# Patient Record
Sex: Male | Born: 1965
Health system: Southern US, Community
[De-identification: ages and names within clinical notes are randomized; demographics above are authoritative.]

## PROBLEM LIST (undated history)

## (undated) DIAGNOSIS — R569 Unspecified convulsions: Secondary | ICD-10-CM

## (undated) DIAGNOSIS — K219 Gastro-esophageal reflux disease without esophagitis: Secondary | ICD-10-CM

## (undated) DIAGNOSIS — J45909 Unspecified asthma, uncomplicated: Secondary | ICD-10-CM

## (undated) DIAGNOSIS — M1990 Unspecified osteoarthritis, unspecified site: Secondary | ICD-10-CM

## (undated) DIAGNOSIS — F329 Major depressive disorder, single episode, unspecified: Secondary | ICD-10-CM

## (undated) DIAGNOSIS — I10 Essential (primary) hypertension: Secondary | ICD-10-CM

## (undated) DIAGNOSIS — F32A Depression, unspecified: Secondary | ICD-10-CM

## (undated) DIAGNOSIS — J449 Chronic obstructive pulmonary disease, unspecified: Secondary | ICD-10-CM

## (undated) DIAGNOSIS — E785 Hyperlipidemia, unspecified: Secondary | ICD-10-CM

## (undated) HISTORY — DX: Unspecified convulsions: R56.9

## (undated) HISTORY — DX: Hyperlipidemia, unspecified: E78.5

## (undated) HISTORY — DX: Depression, unspecified: F32.A

## (undated) HISTORY — DX: Major depressive disorder, single episode, unspecified: F32.9

## (undated) HISTORY — DX: Unspecified osteoarthritis, unspecified site: M19.90

## (undated) HISTORY — DX: Gastro-esophageal reflux disease without esophagitis: K21.9

## (undated) HISTORY — DX: Essential (primary) hypertension: I10

---

## 2006-01-06 ENCOUNTER — Ambulatory Visit (HOSPITAL_COMMUNITY): Admission: RE | Admit: 2006-01-06 | Discharge: 2006-01-06 | Payer: Self-pay | Admitting: *Deleted

## 2006-01-20 ENCOUNTER — Encounter: Admission: RE | Admit: 2006-01-20 | Discharge: 2006-02-08 | Payer: Self-pay | Admitting: *Deleted

## 2011-01-03 ENCOUNTER — Encounter: Payer: Self-pay | Admitting: *Deleted

## 2016-01-20 ENCOUNTER — Encounter: Payer: Self-pay | Admitting: Physician Assistant

## 2016-02-17 ENCOUNTER — Encounter: Payer: Self-pay | Admitting: Physician Assistant

## 2016-03-04 DIAGNOSIS — S79912A Unspecified injury of left hip, initial encounter: Secondary | ICD-10-CM | POA: Diagnosis not present

## 2016-03-04 DIAGNOSIS — Z049 Encounter for examination and observation for unspecified reason: Secondary | ICD-10-CM | POA: Diagnosis not present

## 2016-03-04 DIAGNOSIS — M79605 Pain in left leg: Secondary | ICD-10-CM | POA: Diagnosis not present

## 2016-03-04 DIAGNOSIS — M25552 Pain in left hip: Secondary | ICD-10-CM | POA: Diagnosis not present

## 2016-08-18 ENCOUNTER — Ambulatory Visit: Payer: Self-pay | Admitting: Orthopaedic Surgery

## 2016-08-19 ENCOUNTER — Ambulatory Visit (INDEPENDENT_AMBULATORY_CARE_PROVIDER_SITE_OTHER): Payer: Medicare Other | Admitting: Orthopaedic Surgery

## 2016-08-19 ENCOUNTER — Ambulatory Visit: Payer: Medicare Other

## 2016-08-19 ENCOUNTER — Other Ambulatory Visit: Payer: Self-pay | Admitting: *Deleted

## 2016-08-19 ENCOUNTER — Encounter: Payer: Self-pay | Admitting: Orthopaedic Surgery

## 2016-08-19 ENCOUNTER — Ambulatory Visit (INDEPENDENT_AMBULATORY_CARE_PROVIDER_SITE_OTHER): Payer: Medicare Other

## 2016-08-19 VITALS — BP 150/94 | HR 58 | Temp 97.7°F | Ht 72.0 in | Wt 211.0 lb

## 2016-08-19 DIAGNOSIS — M25572 Pain in left ankle and joints of left foot: Secondary | ICD-10-CM | POA: Diagnosis not present

## 2016-08-19 DIAGNOSIS — M25562 Pain in left knee: Secondary | ICD-10-CM | POA: Diagnosis not present

## 2016-08-19 NOTE — Progress Notes (Signed)
Subjective:  My left knee and my left ankle hurt    Patient ID: Bradley Fisher, male    DOB: 1966/02/20, 50 y.o.   MRN: [id number]  HPI  He stepped in a hole about four months ago and hurt his left knee and his left ankle.  He has been followed at the [facility] in [place] since then.  The last records I have from them were dated 06-30-16.  He has giving way of the left knee. He has swelling and popping.  He has no redness.  He has used heat, ice, rest.  He now uses a knee brace which helps.    He has pain of the right medial ankle.  He uses a brace on it and it helps.  He has also done heat, ice, elevation and rest which helped some.  He is concerned that his knee gives way and continues to hurt.  He is sent here for further evaluation.  Review of Systems  HENT: Negative for congestion.   Respiratory: Negative for cough and shortness of breath.   Cardiovascular: Negative for chest pain and leg swelling.  Endocrine: Positive for cold intolerance.  Musculoskeletal: Positive for arthralgias, gait problem and joint swelling.  Allergic/Immunologic: Positive for environmental allergies.   No past medical history on file.  No past surgical history on file.  No current outpatient prescriptions on file prior to visit.   No current facility-administered medications on file prior to visit.     Social History   Social History  . Marital status: Single    Spouse name: N/A  . Number of children: N/A  . Years of education: N/A   Occupational History  . Not on file.   Social History Main Topics  . Smoking status: Never Smoker  . Smokeless tobacco: Never Used  . Alcohol use Not on file  . Drug use: Unknown  . Sexual activity: Not on file   Other Topics Concern  . Not on file   Social History Narrative  . No narrative on file    He has family history of COPD, heart attacks, stroke and arthritis.  BP (!) 150/94   Pulse (!) 58   Temp 97.7 F (36.5 C)   Ht 6'  (1.829 m)   Wt 211 lb (95.7 kg)   BMI 28.62 kg/m      Objective:   Physical Exam  Constitutional: He is oriented to person, place, and time. He appears well-developed and well-nourished.  HENT:  Head: Normocephalic and atraumatic.  Eyes: Conjunctivae and EOM are normal. Pupils are equal, round, and reactive to light.  Neck: Normal range of motion. Neck supple.  Cardiovascular: Normal rate, regular rhythm and intact distal pulses.   Pulmonary/Chest: Effort normal.  Abdominal: Soft.  Musculoskeletal: He exhibits tenderness (Pain left knee. limp.  ROM knee 0 to 105 with pain.  Slight effusion.  Positive medial McMurray.  Left ankle with full motion and no swelling, medial joint pain.  NV intact.  Right side negative.).  Neurological: He is alert and oriented to person, place, and time. He has normal reflexes. He displays normal reflexes. No cranial nerve deficit. He exhibits normal muscle tone. Coordination normal.  Skin: Skin is warm and dry.  Psychiatric: He has a normal mood and affect. His behavior is normal. Judgment and thought content normal.   X-rays were done of the left knee and left ankle, reported separately.       Assessment & Plan:   Encounter  Diagnoses  Name Primary?  . Left knee pain Yes  . Left ankle pain    I am concerned about a medial meniscus tear of the left knee.  He has swelling, continued pain after months of conservative treatment, giving way of the knee, need for a brace.  He has positive McMurray and definitive limp to the left.  Return after MRI of the knee on the left.  Precautions discussed.  Call if any problem.  Electronically Signed Sanjuana Kava, MD 9/7/201711:08 AM

## 2016-08-25 ENCOUNTER — Ambulatory Visit (HOSPITAL_COMMUNITY)
Admission: RE | Admit: 2016-08-25 | Discharge: 2016-08-25 | Disposition: A | Discharge status: Home / Self Care | Payer: Medicare Other | Source: Ambulatory Visit | Attending: Orthopaedic Surgery | Admitting: Orthopaedic Surgery

## 2016-08-25 DIAGNOSIS — M25562 Pain in left knee: Secondary | ICD-10-CM | POA: Diagnosis present

## 2016-08-25 DIAGNOSIS — M1712 Unilateral primary osteoarthritis, left knee: Secondary | ICD-10-CM | POA: Insufficient documentation

## 2016-08-25 DIAGNOSIS — M23222 Derangement of posterior horn of medial meniscus due to old tear or injury, left knee: Secondary | ICD-10-CM | POA: Insufficient documentation

## 2016-08-26 ENCOUNTER — Encounter: Payer: Self-pay | Admitting: Orthopaedic Surgery

## 2016-08-26 ENCOUNTER — Ambulatory Visit (INDEPENDENT_AMBULATORY_CARE_PROVIDER_SITE_OTHER): Payer: Medicare Other | Admitting: Orthopaedic Surgery

## 2016-08-26 VITALS — BP 146/88 | HR 54 | Ht 72.0 in | Wt 215.0 lb

## 2016-08-26 DIAGNOSIS — M25562 Pain in left knee: Secondary | ICD-10-CM | POA: Diagnosis not present

## 2016-08-26 NOTE — Progress Notes (Signed)
Patient [id number] Bradley Fisher, male DOB:[birth year], 50 y.o. [id number]  Chief Complaint  Patient presents with  . Follow-up    left knee pain here for MRI results    HPI  Bradley Fisher is a 50 y.o. male who has chronic left knee pain with swelling and giving way.  He had MRI which showed: IMPRESSION: 1. Small free edge tear of the posterior horn medial meniscus. 2. Mild degenerative chondral thinning in the lateral compartment.  I have explained the findings.  He still has pain and giving way.  I would like to have him see Dr. Aline Brochure for possible consideration of arthroscopy.  He is agreeable. HPI  Body mass index is 29.16 kg/m.  ROS  Review of Systems  HENT: Negative for congestion.   Respiratory: Negative for cough and shortness of breath.   Cardiovascular: Negative for chest pain and leg swelling.  Endocrine: Positive for cold intolerance.  Musculoskeletal: Positive for arthralgias, gait problem and joint swelling.  Allergic/Immunologic: Positive for environmental allergies.    No past medical history on file.  No past surgical history on file.  No family history on file.  Social History Social History  Substance Use Topics  . Smoking status: Never Smoker  . Smokeless tobacco: Never Used  . Alcohol use Not on file    No Known Allergies  Current Outpatient Prescriptions  Medication Sig Dispense Refill  . Albuterol Sulfate (PROAIR HFA IN) Inhale into the lungs.    . ATORVASTATIN CALCIUM PO Take by mouth.    Marland Kitchen LAMOTRIGINE PO Take by mouth.    Marland Kitchen LISINOPRIL PO Take by mouth.    . MELOXICAM PO Take by mouth.    . OMEPRAZOLE PO Take by mouth.    . SERTRALINE HCL PO Take by mouth.    Marland Kitchen TIZANIDINE HCL PO Take by mouth.     No current facility-administered medications for this visit.      Physical Exam  Blood pressure (!) 146/88, pulse (!) 54, height 6' (1.829 m), weight 215 lb (97.5 kg).  Constitutional: overall normal hygiene, normal nutrition, well  developed, normal grooming, normal body habitus. Assistive device:none  Musculoskeletal: gait and station Limp left, muscle tone and strength are normal, no tremors or atrophy is present.  .  Neurological: coordination overall normal.  Deep tendon reflex/nerve stretch intact.  Sensation normal.  Cranial nerves II-XII intact.   Skin:   Normal overall no scars, lesions, ulcers or rashes. No psoriasis.  Psychiatric: Alert and oriented x 3.  Recent memory intact, remote memory unclear.  Normal mood and affect. Well groomed.  Good eye contact.  Cardiovascular: overall no swelling, no varicosities, no edema bilaterally, normal temperatures of the legs and arms, no clubbing, cyanosis and good capillary refill.  Lymphatic: palpation is normal.  The right lower extremity is examined:  Inspection:  Thigh:  Non-tender and no defects  Knee has swelling 1+ effusion.                        Joint tenderness is present                        Patient is tender over the medial joint line  Lower Leg:  Has normal appearance and no tenderness or defects  Ankle:  Non-tender and no defects  Foot:  Non-tender and no defects Range of Motion:  Knee:  Range of motion is: 0-105  Crepitus is  present  Ankle:  Range of motion is normal. Strength and Tone:  The left lower extremity has normal strength and tone. Stability:  Knee:  The knee has positive medial McMurray.  Ankle:  The ankle is stable.    The patient has been educated about the nature of the problem(s) and counseled on treatment options.  The patient appeared to understand what I have discussed and is in agreement with it.  Encounter Diagnosis  Name Primary?  . Left knee pain Yes    PLAN Call if any problems.  Precautions discussed.  Continue current medications.   Return to clinic see Dr. Aline Brochure for possible surgery   Electronically Signed Sanjuana Kava, MD 9/14/20173:20 PM

## 2016-09-03 ENCOUNTER — Encounter: Payer: Self-pay | Admitting: Orthopedic Surgery

## 2016-09-03 ENCOUNTER — Ambulatory Visit (INDEPENDENT_AMBULATORY_CARE_PROVIDER_SITE_OTHER): Payer: Medicare Other | Admitting: Orthopedic Surgery

## 2016-09-03 VITALS — BP 136/76 | HR 56 | Ht 72.0 in | Wt 215.0 lb

## 2016-09-03 DIAGNOSIS — M545 Low back pain, unspecified: Secondary | ICD-10-CM

## 2016-09-03 DIAGNOSIS — M25562 Pain in left knee: Secondary | ICD-10-CM | POA: Diagnosis not present

## 2016-09-03 DIAGNOSIS — M23322 Other meniscus derangements, posterior horn of medial meniscus, left knee: Secondary | ICD-10-CM

## 2016-09-03 MED ORDER — NABUMETONE 500 MG PO TABS: 500.0000 mg | ORAL_TABLET | Freq: Two times a day (BID) | ORAL | 1 refills | Status: DC

## 2016-09-03 NOTE — Progress Notes (Signed)
Chief Complaint  Patient presents with  . Knee Pain    Left knee pain, referred from Dr. Luna Glasgow for surgery.   Preoperative history and physical  HPI 50 year old male presents for evaluation of possible arthroscopy left knee for torn medial meniscus diagnosed on MRI. His symptoms include pain on the medial joint line and mechanical symptoms post stepping in a hole some time ago. X-rays did show some mild degenerative changes and an effusion  He's nonoperative treatment with Dr. Luna Glasgow which failed to relieve his symptoms  HPI as reported by Dr. Luna Glasgow   He stepped in a hole about four months ago and hurt his left knee and his left ankle.  He has been followed at the [facility] in [place] since then.  The last records I have from them were dated 06-30-16.   He has giving way of the left knee. He has swelling and popping.  He has no redness.  He has used heat, ice, rest.  He now uses a knee brace which helps.     He notes that his pain is in his medial calf posterior leg radiates down to his medial ankle without any back pain, but he does have history of arthritis in his back.   ROS Review of Systems as previously recorded by Dr. Luna Glasgow HENT: Negative for congestion.   Respiratory: Negative for cough and shortness of breath.   Cardiovascular: Negative for chest pain and leg swelling.  Endocrine: Positive for cold intolerance.  Musculoskeletal: Positive for arthralgias, gait problem and joint swelling.  Allergic/Immunologic: Positive for environmental allergies.    History reviewed. No pertinent past medical history. The patient reports no medical history and he says he's never had surgery  He has no family history of any serious medical illnesses or bleeding disorders  History reviewed. No pertinent surgical history. History reviewed. No pertinent family history. Social History  Substance Use Topics  . Smoking status: Never Smoker  . Smokeless tobacco: Current User     Types: Chew  . Alcohol use Not on file   No outpatient prescriptions have been marked as taking for the 09/03/16 encounter (Office Visit) with Carole Civil, MD.    BP 136/76   Pulse (!) 56   Ht 6' (1.829 m)   Wt 215 lb (97.5 kg)   BMI 29.16 kg/m   Physical Exam  Constitutional: He is oriented to person, place, and time. He appears well-developed and well-nourished. No distress.  Cardiovascular: Normal rate and intact distal pulses.   Neurological: He is alert and oriented to person, place, and time.  Skin: Skin is warm and dry. No rash noted. He is not diaphoretic. No erythema. No pallor.  Psychiatric: He has a normal mood and affect. His behavior is normal. Judgment and thought content normal.    Ortho Exam he does have a slight limp when he walks  Right knee range of motion stability and strength normal skin without rash lesion or ulceration tenderness or swelling no peripheral edema normal sensation  Left knee medial joint line tenderness. On straight leg raising he had pain radiating from his posterior knee area across the medial calf muscle down into the left ankle without numbness or tingling  He had no joint effusion is range of motion was full his leg is stable in terms of knee ligaments and strength muscle tone were normal skin was intact without rash or erythema   ASSESSMENT: My personal interpretation of the images:  Plain films are really unremarkable  he may have some symmetric joint space narrowing but no secondary bone changes.  His MRI shows a small medial meniscal tear on the free edge   I'm concerned that his pain is out of proportion to his imaging findings and exam findings. I think he may have some lumbar spine related pain in his left leg  He was agreeable to an injection in a trial of bracing and Relafen and come back in 2 weeks if no improvement he understands that we can still do the knee scope and we may or may not relieve all his  symptoms PLAN Procedure note left knee injection verbal consent was obtained to inject left knee joint  Timeout was completed to confirm the site of injection  The medications used were 40 mg of Depo-Medrol and 1% lidocaine 3 cc  Anesthesia was provided by ethyl chloride and the skin was prepped with alcohol.  After cleaning the skin with alcohol a 20-gauge needle was used to inject the left knee joint. There were no complications. A sterile bandage was applied.  2 week recheck  Arther Abbott, MD 09/03/2016 9:34 AM  .meds

## 2016-09-03 NOTE — Patient Instructions (Addendum)

## 2016-09-14 ENCOUNTER — Other Ambulatory Visit: Payer: Self-pay | Admitting: *Deleted

## 2016-09-14 MED ORDER — OMEPRAZOLE 20 MG PO CPDR: 20.0000 mg | DELAYED_RELEASE_CAPSULE | Freq: Two times a day (BID) | ORAL | 1 refills | Status: DC

## 2016-09-15 ENCOUNTER — Other Ambulatory Visit: Payer: Self-pay | Admitting: *Deleted

## 2016-09-15 MED ORDER — OMEPRAZOLE 20 MG PO CPDR
20.0000 mg | DELAYED_RELEASE_CAPSULE | Freq: Two times a day (BID) | ORAL | 1 refills | Status: DC
Start: 2016-09-15 — End: 2017-01-12

## 2016-09-17 ENCOUNTER — Ambulatory Visit: Payer: Medicare Other | Admitting: Orthopedic Surgery

## 2016-09-27 ENCOUNTER — Ambulatory Visit (INDEPENDENT_AMBULATORY_CARE_PROVIDER_SITE_OTHER): Payer: Medicare Other | Admitting: Orthopedic Surgery

## 2016-09-27 ENCOUNTER — Encounter: Payer: Self-pay | Admitting: Orthopedic Surgery

## 2016-09-27 VITALS — BP 136/89 | HR 62 | Wt 212.0 lb

## 2016-09-27 DIAGNOSIS — M23322 Other meniscus derangements, posterior horn of medial meniscus, left knee: Secondary | ICD-10-CM | POA: Diagnosis not present

## 2016-09-27 NOTE — Patient Instructions (Addendum)
Surgery left knee arthroscopy with partial medial meniscectomy  You have decided to proceed with operative arthroscopy of the knee. You have decided not to continue with nonoperative measures such as but not limited to oral medication, weight loss, activity modification, physical therapy, bracing, or injection.  We will perform operative arthroscopy of the knee. Some of the risks associated with arthroscopic surgery of the knee include but are not limited to Bleeding Infection Swelling Stiffness Blood clot Pain  If you're not comfortable with these risks and would like to continue with nonoperative treatment please let Dr. Aline Brochure know prior to your surgery.  Knee Arthroscopy Knee arthroscopy is a surgical procedure that is used to examine the inside of your knee joint and repair any damage. The surgeon puts a small, lighted instrument with a camera on the tip (arthroscope) through a small incision in your knee. The camera sends pictures to a monitor in the operating room. Your surgeon uses those pictures to guide the surgical instruments through other incisions to the area of damage. Knee arthroscopy can be used to treat many types of knee problems. It may be used:  To repair a torn ligament.  To repair or remove damaged tissue.  To remove a fluid-filled sac (cyst) from your knee. LET [facility] CARE PROVIDER KNOW ABOUT:  Any allergies you have.  All medicines you are taking, including vitamins, herbs, eye drops, creams, and over-the-counter medicines.  Previous problems you or members of your family have had with the use of anesthetics.  Any blood disorders you have.  Previous surgeries you have had.  Any medical conditions you may have. RISKS AND COMPLICATIONS Generally, this is a safe procedure. However, problems may occur, including:  Infection.  Bleeding.  Damage to blood vessels, nerves, or structures of your knee.  A blood clot that forms in your leg and travels  to your lung.  Failure to relieve symptoms. BEFORE THE PROCEDURE  Ask your health care provider about:  Changing or stopping your regular medicines. This is especially important if you are taking diabetes medicines or blood thinners.  Taking medicines such as aspirin and ibuprofen. These medicines can thin your blood. Do not take these medicines before your procedure if your health care provider instructs you not to.  Follow your health care provider's instructions about eating or drinking restrictions.  Plan to have someone take you home after the procedure.  If you go home right after the procedure, plan to have someone with you for 24 hours.  Do not drink alcohol unless your health care provider says that you can.  Do not use any tobacco products, including cigarettes, chewing tobacco, or electronic cigarettes unless your health care provider says that you can. If you need help quitting, ask your health care provider.  You may have a physical exam. PROCEDURE  An IV tube will be inserted into one of your veins.  You will be given one or more of the following:  A medicine that helps you relax (sedative).  A medicine that numbs the area (local anesthetic).  A medicine that makes you fall asleep (general anesthetic).  A medicine that is injected into your spine that numbs the area below and slightly above the injection site (spinal anesthetic).  A medicine that is injected into an area of your body that numbs everything below the injection site (regional anesthetic).  A cuff may be placed around your upper leg to slow bleeding during the procedure.  The surgeon will make a  small number of incisions around your knee.  Your knee joint will be flushed and filled with a germ-free (sterile) solution.  The arthroscope will be passed through an incision into your knee joint.  More instruments will be passed through other incisions to repair your knee as needed.  The fluid will  be removed from your knee.  The incisions will be closed with adhesive strips or stitches (sutures).  A bandage (dressing) will be placed over your knee. The procedure may vary among health care providers and hospitals. AFTER THE PROCEDURE  Your blood pressure, heart rate, breathing rate and blood oxygen level will be monitored often until the medicines you were given have worn off.  You may be given medicine for pain.  You may get crutches to help you walk without using your knee to support your body weight.  You may have to wear compression stockings. These stocking help to prevent blood clots and reduce swelling in your legs.   This information is not intended to replace advice given to you by your health care provider. Make sure you discuss any questions you have with your health care provider.   Document Released: 11/26/2000 Document Revised: 04/15/2015 Document Reviewed: 11/25/2014 Elsevier Interactive Patient Education 2016 [employer] Knee Arthroscopy Knee arthroscopy is a surgical procedure that is used to examine the inside of your knee joint and repair any damage. The surgeon puts a small, lighted instrument with a camera on the tip (arthroscope) through a small incision in your knee. The camera sends pictures to a monitor in the operating room. Your surgeon uses those pictures to guide the surgical instruments through other incisions to the area of damage. Knee arthroscopy can be used to treat many types of knee problems. It may be used:  To repair a torn ligament.  To repair or remove damaged tissue.  To remove a fluid-filled sac (cyst) from your knee. LET [facility] CARE PROVIDER KNOW ABOUT:  Any allergies you have.  All medicines you are taking, including vitamins, herbs, eye drops, creams, and over-the-counter medicines.  Previous problems you or members of your family have had with the use of anesthetics.  Any blood disorders you have.  Previous surgeries  you have had.  Any medical conditions you may have. RISKS AND COMPLICATIONS Generally, this is a safe procedure. However, problems may occur, including:  Infection.  Bleeding.  Damage to blood vessels, nerves, or structures of your knee.  A blood clot that forms in your leg and travels to your lung.  Failure to relieve symptoms. BEFORE THE PROCEDURE  Ask your health care provider about:  Changing or stopping your regular medicines. This is especially important if you are taking diabetes medicines or blood thinners.  Taking medicines such as aspirin and ibuprofen. These medicines can thin your blood. Do not take these medicines before your procedure if your health care provider instructs you not to.  Follow your health care provider's instructions about eating or drinking restrictions.  Plan to have someone take you home after the procedure.  If you go home right after the procedure, plan to have someone with you for 24 hours.  Do not drink alcohol unless your health care provider says that you can.  Do not use any tobacco products, including cigarettes, chewing tobacco, or electronic cigarettes unless your health care provider says that you can. If you need help quitting, ask your health care provider.  You may have a physical exam. PROCEDURE  An IV tube will  be inserted into one of your veins.  You will be given one or more of the following:  A medicine that helps you relax (sedative).  A medicine that numbs the area (local anesthetic).  A medicine that makes you fall asleep (general anesthetic).  A medicine that is injected into your spine that numbs the area below and slightly above the injection site (spinal anesthetic).  A medicine that is injected into an area of your body that numbs everything below the injection site (regional anesthetic).  A cuff may be placed around your upper leg to slow bleeding during the procedure.  The surgeon will make a small  number of incisions around your knee.  Your knee joint will be flushed and filled with a germ-free (sterile) solution.  The arthroscope will be passed through an incision into your knee joint.  More instruments will be passed through other incisions to repair your knee as needed.  The fluid will be removed from your knee.  The incisions will be closed with adhesive strips or stitches (sutures).  A bandage (dressing) will be placed over your knee. The procedure may vary among health care providers and hospitals. AFTER THE PROCEDURE  Your blood pressure, heart rate, breathing rate and blood oxygen level will be monitored often until the medicines you were given have worn off.  You may be given medicine for pain.  You may get crutches to help you walk without using your knee to support your body weight.  You may have to wear compression stockings. These stocking help to prevent blood clots and reduce swelling in your legs.   This information is not intended to replace advice given to you by your health care provider. Make sure you discuss any questions you have with your health care provider.   Document Released: 11/26/2000 Document Revised: 04/15/2015 Document Reviewed: 11/25/2014 Elsevier Interactive Patient Education [employer].

## 2016-09-27 NOTE — Addendum Note (Signed)
Addended by: Baldomero Lamy B on: 09/27/2016 05:37 PM   Modules accepted: Orders, SmartSet

## 2016-09-27 NOTE — Progress Notes (Signed)
Patient ID: Bradley Fisher, male   DOB: Nov 14, 1966, 50 y.o.   MRN: [id number]  Chief Complaint  Patient presents with  . Follow-up    LEFT KNEE PAIN    HPI Bradley Fisher is a 50 y.o. male.   HPI  50 year old male had injection in his left knee for medial meniscal tear and pain. MRI documented tear complaint mechanical symptoms. Injection didn't help  Patient would like to now proceed with left knee arthroscopy  Review of Systems Review of Systems ROS Review of Systems as previously recorded by Dr. Luna Glasgow HENT: Negative for congestion.   Respiratory: Negative for cough and shortness of breath.   Cardiovascular: Negative for chest pain and leg swelling.  Endocrine: Positive for cold intolerance.  Musculoskeletal: Positive for arthralgias, gait problem and joint swelling.  Allergic/Immunologic: Positive for environmental allergies.   The patient reports no major medical problems such as hypertension or heart disease  Physical Exam BP 136/89   Pulse 62   Wt 212 lb (96.2 kg)   BMI 28.75 kg/m    Physical Exam  Exam has not changed  Physical Exam  Constitutional: He is oriented to person, place, and time. He appears well-developed and well-nourished. No distress.  Cardiovascular: Normal rate and intact distal pulses.   Neurological: He is alert and oriented to person, place, and time.  Skin: Skin is warm and dry. No rash noted. He is not diaphoretic. No erythema. No pallor.  Psychiatric: He has a normal mood and affect. His behavior is normal. Judgment and thought content normal.      Ortho Exam he does have a slight limp when he walks   Right knee range of motion stability and strength normal skin without rash lesion or ulceration tenderness or swelling no peripheral edema normal sensation   Left knee medial joint line tenderness. On straight leg raising he had pain radiating from his posterior knee area across the medial calf muscle down into the left ankle without  numbness or tingling   He had no joint effusion is range of motion was full his leg is stable in terms of knee ligaments and strength muscle tone were normal skin was intact without rash or erythema   So we'll go ahead and proceed with arthroscopy left knee partial medial meniscectomy  Encounter Diagnosis  Name Primary?  . Medial meniscus, posterior horn derangement, left Yes

## 2016-10-11 ENCOUNTER — Other Ambulatory Visit: Payer: Self-pay | Admitting: Physician Assistant

## 2016-10-27 ENCOUNTER — Telehealth: Payer: Self-pay | Admitting: Physician Assistant

## 2016-10-27 ENCOUNTER — Ambulatory Visit (INDEPENDENT_AMBULATORY_CARE_PROVIDER_SITE_OTHER): Payer: Medicare Other | Admitting: Physician Assistant

## 2016-10-27 ENCOUNTER — Encounter: Payer: Self-pay | Admitting: Physician Assistant

## 2016-10-27 VITALS — BP 142/88 | HR 57 | Temp 97.4°F | Ht 72.0 in | Wt 215.6 lb

## 2016-10-27 DIAGNOSIS — M159 Polyosteoarthritis, unspecified: Secondary | ICD-10-CM

## 2016-10-27 DIAGNOSIS — Z23 Encounter for immunization: Secondary | ICD-10-CM | POA: Diagnosis not present

## 2016-10-27 DIAGNOSIS — Z87898 Personal history of other specified conditions: Secondary | ICD-10-CM | POA: Insufficient documentation

## 2016-10-27 DIAGNOSIS — M545 Low back pain, unspecified: Secondary | ICD-10-CM | POA: Insufficient documentation

## 2016-10-27 DIAGNOSIS — G8929 Other chronic pain: Secondary | ICD-10-CM

## 2016-10-27 DIAGNOSIS — G40909 Epilepsy, unspecified, not intractable, without status epilepticus: Secondary | ICD-10-CM

## 2016-10-27 DIAGNOSIS — I10 Essential (primary) hypertension: Secondary | ICD-10-CM | POA: Diagnosis not present

## 2016-10-27 NOTE — Progress Notes (Signed)
BP (!) 142/88   Pulse (!) 57   Temp 97.4 F (36.3 C) (Oral)   Ht 6' (1.829 m)   Wt 215 lb 9.6 oz (97.8 kg)   BMI 29.24 kg/m    Subjective:    Patient ID: Bradley Fisher, male    DOB: 1966/06/22, 50 y.o.   MRN: [id number]  [place] is a 50 y.o. male presenting on 10/27/2016 for Follow-up  HPI Patient here to be established as new patient at [facility].  This patient is known to me from [facility]. This patient comes in for periodic recheck on medications and conditions. All medications are reviewed today. There are no reports of any problems with the medications. All of the medical conditions are reviewed and updated.  Lab work is reviewed and will be ordered as medically necessary. There are no new problems reported with today's visit. He is currently under the care of Dr. Aline Brochure for a Baker cyst in his right knee. He will be having surgery later this month. We will have labs performed at his next visit. He continues with his chronic low back pain. He has been seizure-free at this time. He is not currently under the care of a neurologist. We will have an appointment set up with Dr. Merlene Laughter in [place]. This is a good location for him to get transportation when he has an appointment  Past Medical History:  Diagnosis Date  . Arthritis   . Depression   . GERD (gastroesophageal reflux disease)   . Hyperlipidemia   . Hypertension   . Seizures ([place])     Relevant past medical, surgical, family and social history reviewed and updated as indicated. Interim medical history since our last visit reviewed. Allergies and medications reviewed and updated.   Data reviewed from any sources in EPIC.  Review of Systems  Constitutional: Negative.  Negative for appetite change and fatigue.  HENT: Negative.   Eyes: Negative.  Negative for pain and visual disturbance.  Respiratory: Negative.  Negative for cough, chest tightness, shortness of breath  and wheezing.   Cardiovascular: Negative.  Negative for chest pain, palpitations and leg swelling.  Gastrointestinal: Negative.  Negative for abdominal pain, diarrhea, nausea and vomiting.  Endocrine: Negative.   Genitourinary: Negative.   Musculoskeletal: Negative.   Skin: Negative.  Negative for color change and rash.  Neurological: Negative.  Negative for weakness, numbness and headaches.  Psychiatric/Behavioral: Negative.      Social History   Social History  . Marital status: Single    Spouse name: N/A  . Number of children: N/A  . Years of education: N/A   Occupational History  . Not on file.   Social History Main Topics  . Smoking status: Never Smoker  . Smokeless tobacco: Current User    Types: Chew  . Alcohol use No  . Drug use: No  . Sexual activity: Not on file   Other Topics Concern  . Not on file   Social History Narrative  . No narrative on file    History reviewed. No pertinent surgical history.  History reviewed. No pertinent family history.    Medication List       Accurate as of 10/27/16 12:35 PM. Always use your most recent med list.          atorvastatin 10 MG tablet Commonly known as:  LIPITOR Take 10 mg by mouth daily.   lamoTRIgine 150 MG tablet Commonly known as:  LAMICTAL Take  150 mg by mouth 2 (two) times daily.   lisinopril 10 MG tablet Commonly known as:  PRINIVIL,ZESTRIL Take 10 mg by mouth daily.   loratadine 10 MG tablet Commonly known as:  CLARITIN Take 10 mg by mouth daily.   meloxicam 15 MG tablet Commonly known as:  MOBIC Take 15 mg by mouth daily.   omeprazole 20 MG capsule Commonly known as:  PRILOSEC Take 1 capsule (20 mg total) by mouth 2 (two) times daily before a meal.   PROAIR HFA 108 (90 Base) MCG/ACT inhaler Generic drug:  albuterol Inhale 2 puffs into the lungs every 6 (six) hours as needed.   sertraline 100 MG tablet Commonly known as:  ZOLOFT Take 1 Tablet by mouth once daily   tiZANidine  4 MG tablet Commonly known as:  ZANAFLEX Take 4 mg by mouth every 6 (six) hours as needed for muscle spasms.          Objective:    BP (!) 142/88   Pulse (!) 57   Temp 97.4 F (36.3 C) (Oral)   Ht 6' (1.829 m)   Wt 215 lb 9.6 oz (97.8 kg)   BMI 29.24 kg/m   No Known Allergies Wt Readings from Last 3 Encounters:  10/27/16 215 lb 9.6 oz (97.8 kg)  09/27/16 212 lb (96.2 kg)  09/03/16 215 lb (97.5 kg)    Physical Exam  Constitutional: He appears well-developed and well-nourished.  HENT:  Head: Normocephalic and atraumatic.  Eyes: Conjunctivae and EOM are normal. Pupils are equal, round, and reactive to light.  Neck: Normal range of motion. Neck supple.  Cardiovascular: Normal rate, regular rhythm and normal heart sounds.   Pulmonary/Chest: Effort normal and breath sounds normal.  Abdominal: Soft. Bowel sounds are normal.  Musculoskeletal: Normal range of motion.  Skin: Skin is warm and dry.  Nursing note and vitals reviewed.      Assessment & Plan:   1. Encounter for immunization - lamoTRIgine (LAMICTAL) 150 MG tablet; Take 150 mg by mouth 2 (two) times daily.  - PROAIR HFA 108 (90 Base) MCG/ACT inhaler; Inhale 2 puffs into the lungs every 6 (six) hours as needed.  - lisinopril (PRINIVIL,ZESTRIL) 10 MG tablet; Take 10 mg by mouth daily.  - meloxicam (MOBIC) 15 MG tablet; Take 15 mg by mouth daily. - loratadine (CLARITIN) 10 MG tablet; Take 10 mg by mouth daily. - tiZANidine (ZANAFLEX) 4 MG tablet; Take 4 mg by mouth every 6 (six) hours as needed for muscle spasms. - atorvastatin (LIPITOR) 10 MG tablet; Take 10 mg by mouth daily. - Flu Vaccine QUAD 36+ mos IM  2. Generalized OA - meloxicam (MOBIC) 15 MG tablet; Take 15 mg by mouth daily.  3. Chronic midline low back pain without sciatica - meloxicam (MOBIC) 15 MG tablet; Take 15 mg by mouth daily.  4. Essential hypertension  5. Nonintractable epilepsy without status epilepticus, unspecified epilepsy type [facility]) -  Ambulatory referral to Neurology   Continue all other maintenance medications as listed above. Educational handout given for back exercises.  Follow up plan: Return in about 3 months (around 01/27/2017) for recheck meds and labs.  Terald Sleeper PA-C [facility] [address]  [place], [place] [address] [phone]   10/27/2016, 12:35 PM

## 2016-10-27 NOTE — Patient Instructions (Signed)
Back Exercises Introduction If you have pain in your back, do these exercises 2-3 times each day or as told by your doctor. When the pain goes away, do the exercises once each day, but repeat the steps more times for each exercise (do more repetitions). If you do not have pain in your back, do these exercises once each day or as told by your doctor. Exercises Single Knee to Chest  Do these steps 3-5 times in a row for each leg: 1. Lie on your back on a firm bed or the floor with your legs stretched out. 2. Bring one knee to your chest. 3. Hold your knee to your chest by grabbing your knee or thigh. 4. Pull on your knee until you feel a gentle stretch in your lower back. 5. Keep doing the stretch for 10-30 seconds. 6. Slowly let go of your leg and straighten it. Pelvic Tilt  Do these steps 5-10 times in a row: 1. Lie on your back on a firm bed or the floor with your legs stretched out. 2. Bend your knees so they point up to the ceiling. Your feet should be flat on the floor. 3. Tighten your lower belly (abdomen) muscles to press your lower back against the floor. This will make your tailbone point up to the ceiling instead of pointing down to your feet or the floor. 4. Stay in this position for 5-10 seconds while you gently tighten your muscles and breathe evenly. Cat-Cow  Do these steps until your lower back bends more easily: 1. Get on your hands and knees on a firm surface. Keep your hands under your shoulders, and keep your knees under your hips. You may put padding under your knees. 2. Let your head hang down, and make your tailbone point down to the floor so your lower back is round like the back of a cat. 3. Stay in this position for 5 seconds. 4. Slowly lift your head and make your tailbone point up to the ceiling so your back hangs low (sags) like the back of a cow. 5. Stay in this position for 5 seconds. Press-Ups  Do these steps 5-10 times in a row: 1. Lie on your belly  (face-down) on the floor. 2. Place your hands near your head, about shoulder-width apart. 3. While you keep your back relaxed and keep your hips on the floor, slowly straighten your arms to raise the top half of your body and lift your shoulders. Do not use your back muscles. To make yourself more comfortable, you may change where you place your hands. 4. Stay in this position for 5 seconds. 5. Slowly return to lying flat on the floor. Bridges  Do these steps 10 times in a row: 1. Lie on your back on a firm surface. 2. Bend your knees so they point up to the ceiling. Your feet should be flat on the floor. 3. Tighten your butt muscles and lift your butt off of the floor until your waist is almost as high as your knees. If you do not feel the muscles working in your butt and the back of your thighs, slide your feet 1-2 inches farther away from your butt. 4. Stay in this position for 3-5 seconds. 5. Slowly lower your butt to the floor, and let your butt muscles relax. If this exercise is too easy, try doing it with your arms crossed over your chest. Belly Crunches  Do these steps 5-10 times in a row: 1. Lie   on your back on a firm bed or the floor with your legs stretched out. 2. Bend your knees so they point up to the ceiling. Your feet should be flat on the floor. 3. Cross your arms over your chest. 4. Tip your chin a little bit toward your chest but do not bend your neck. 5. Tighten your belly muscles and slowly raise your chest just enough to lift your shoulder blades a tiny bit off of the floor. 6. Slowly lower your chest and your head to the floor. Back Lifts  Do these steps 5-10 times in a row: 1. Lie on your belly (face-down) with your arms at your sides, and rest your forehead on the floor. 2. Tighten the muscles in your legs and your butt. 3. Slowly lift your chest off of the floor while you keep your hips on the floor. Keep the back of your head in line with the curve in your back.  Look at the floor while you do this. 4. Stay in this position for 3-5 seconds. 5. Slowly lower your chest and your face to the floor. Contact a doctor if:  Your back pain gets a lot worse when you do an exercise.  Your back pain does not lessen 2 hours after you exercise. If you have any of these problems, stop doing the exercises. Do not do them again unless your doctor says it is okay. Get help right away if:  You have sudden, very bad back pain. If this happens, stop doing the exercises. Do not do them again unless your doctor says it is okay. This information is not intended to replace advice given to you by your health care provider. Make sure you discuss any questions you have with your health care provider. Document Released: 01/01/2011 Document Revised: 05/06/2016 Document Reviewed: 01/23/2015  2017 Elsevier  

## 2016-10-27 NOTE — Telephone Encounter (Signed)
Tried to contact patient, no answer.

## 2016-10-28 ENCOUNTER — Encounter: Payer: Self-pay | Admitting: *Deleted

## 2016-10-28 NOTE — Progress Notes (Unsigned)
Pre authorization not required for outpatient surgery cpt code 29881  Reference Burnadette Peter. 10/28/16 3:17p

## 2016-10-29 NOTE — Telephone Encounter (Signed)
Spoke with Patty Sermons and told her she is not on Mr. Gilpatrick's HIPPA form so we are not allowed to speak with her about him.  She understood and said she is his neighbor and he had came to her house to ask her to call us.  She will let him know we have called and let him know that the person on his HIPPA form may contact us to help him.

## 2016-11-02 NOTE — Patient Instructions (Signed)
Bradley Fisher  11/02/2016     @PREFPERIOPPHARMACY @   Your procedure is scheduled on  11/11/2016  Report to [facility] at 1130  A.M.  Call this number if you have problems the morning of surgery:  [phone]   Remember:  Do not eat food or drink liquids after midnight.  Take these medicines the morning of surgery with A SIP OF WATER  Lisinopril, claritin, mobic, prilosec, zoloft. Take your inhaler before you come.   Do not wear jewelry, make-up or nail polish.  Do not wear lotions, powders, or perfumes, or deoderant.  Do not shave 48 hours prior to surgery.  Men may shave face and neck.  Do not bring valuables to the hospital.  [facility] is not responsible for any belongings or valuables.  Contacts, dentures or bridgework may not be worn into surgery.  Leave your suitcase in the car.  After surgery it may be brought to your room.  For patients admitted to the hospital, discharge time will be determined by your treatment team.  Patients discharged the day of surgery will not be allowed to drive home.   Name and phone number of your driver:   family Special instructions:  none  Please read over the following fact sheets that you were given. Anesthesia Post-op Instructions and Care and Recovery After Surgery      Knee Ligament Injury, Arthroscopy Arthroscopy is a surgical technique in which your health care provider examines your knee through a small, pencil-sized telescope (arthroscope). Often, repairs to injured ligaments can be done with instruments in the arthroscope. Arthroscopy is less invasive than open-knee surgery. Tell a health care provider about:  Any allergies you have.  All medicines you are taking, including vitamins, herbs, eye drops, creams, and over-the-counter medicines.  Any problems you or family members have had with anesthetic medicines.  Any blood disorders you have.  Any surgeries you have had.  Any medical conditions you  have. What are the risks? Generally, this is a safe procedure. However, as with any procedure, problems can occur. Possible problems include:  Infection.  Bleeding.  Stiffness. What happens before the procedure?  Ask your health care provider about changing or stopping any regular medicines. Avoid taking aspirin or blood thinners as directed by your health care provider.  Do not eat or drink anything after midnight the night before surgery.  If you smoke, do not smoke for at least 2 weeks before your surgery.  Do not drink alcohol starting the day before your surgery.  Let your health care provider know if you develop a cold or any infection before your surgery.  Arrange for someone to drive you home after the surgery or after your hospital stay. Also arrange for someone to help you with activities during recovery. What happens during the procedure?  Small monitors will be put on your body. They are used to check your heart, blood pressure, and oxygen levels.  An IV access tube will be put into one of your veins. Medicine will be able to flow directly into your body through this IV tube.  You might be given a medicine to help you relax (sedative).  You will be given a medicine that makes you go to sleep (general anesthetic), and a breathing tube will be placed into your lungs during the procedure.  Several small incisions are made in your knee. Saline fluid is placed into one of the incisions to expand the  knee and clear away any blood in the knee.  Your health care provider will insert the arthroscope to examine the injured knee.  During arthroscopy, your health care provider may find a partial or complete tear in a ligament.  Tools can be inserted through the other incisions to repair the injured ligaments.  The incisions are then closed with absorbable stitches and covered with dressings. What happens after the procedure?  You will be taken to the recovery area where you  will be monitored.  When you are awake, stable, and taking fluids without problems, you will be allowed to go home. This information is not intended to replace advice given to you by your health care provider. Make sure you discuss any questions you have with your health care provider. Document Released: 11/26/2000 Document Revised: 05/06/2016 Document Reviewed: 07/11/2013 Elsevier Interactive Patient Education  2017 [place]. Knee Ligament Injury, Arthroscopy, Care After Refer to this sheet in the next few weeks. These instructions provide you with information on caring for yourself after your procedure. Your health care provider may also give you more specific instructions. Your treatment has been planned according to current medical practices, but problems sometimes occur. Call your health care provider if you have any problems or questions after your procedure. What can I expect after the procedure? After your procedure, it is typical to have the following:  Pain and swelling in your knee.  Your ankle and calf may be swollen and bruised for 3-4 days.  You will be tired during your recovery and will need to rest during the day. You will return to your normal level of activity over the next month following surgery.  You may be constipated. Follow these instructions at home: Ligaments take a long time to heal. For the first several weeks, you may be instructed to limit the amount of weight you put on your leg. A removable knee immobilizer or hinged splint may be used to support your knee. You can use crutches to help you get up and move around. Recovery exercises can help you regain strength and range of motion in your knee. Increased muscle strength helps support the knee. These exercises allow a faster return to normal activities. Consult your health care provider before performing any exercise. The following suggestions may help to relieve discomfort at home after your procedure:  Apply  ice to the injured area:  Put ice in a plastic bag.  Place a towel between your skin and the bag.  Leave the ice on for 20 minutes, 2-3 times a day.  To help relieve constipation, drink 6-8 glasses of water a day and eat plenty of fruits and vegetables. You may be given a stool softener to prevent constipation.  You will be given medicine to control pain. Only take over-the-counter or prescription medicines for pain, discomfort, or fever as directed by your health care provider. Contact a health care provider if:  You have increased bleeding (more than a small spot) from your incision site.  You notice redness, swelling, or increasing pain at your incision site.  You notice pus coming from your incision.  You notice a foul smell coming from your incision or dressing.  You develop increasing stiffness or pain in your knee. Get help right away if:  You have a fever.  You develop a rash, difficulty breathing, or any allergic problems. This information is not intended to replace advice given to you by your health care provider. Make sure you discuss any questions  you have with your health care provider. Document Released: 09/19/2013 Document Revised: 05/06/2016 Document Reviewed: 07/11/2013 Elsevier Interactive Patient Education  2017 [employer]. PATIENT INSTRUCTIONS POST-ANESTHESIA  IMMEDIATELY FOLLOWING SURGERY:  Do not drive or operate machinery for the first twenty four hours after surgery.  Do not make any important decisions for twenty four hours after surgery or while taking narcotic pain medications or sedatives.  If you develop intractable nausea and vomiting or a severe headache please notify your doctor immediately.  FOLLOW-UP:  Please make an appointment with your surgeon as instructed. You do not need to follow up with anesthesia unless specifically instructed to do so.  WOUND CARE INSTRUCTIONS (if applicable):  Keep a dry clean dressing on the anesthesia/puncture  wound site if there is drainage.  Once the wound has quit draining you may leave it open to air.  Generally you should leave the bandage intact for twenty four hours unless there is drainage.  If the epidural site drains for more than 36-48 hours please call the anesthesia department.  QUESTIONS?:  Please feel free to call your physician or the hospital operator if you have any questions, and they will be happy to assist you.

## 2016-11-08 ENCOUNTER — Encounter (HOSPITAL_COMMUNITY)
Admission: RE | Admit: 2016-11-08 | Discharge: 2016-11-08 | Disposition: A | Discharge status: Home / Self Care | Payer: Medicare Other | Source: Ambulatory Visit | Attending: Orthopedic Surgery | Admitting: Orthopedic Surgery

## 2016-11-08 ENCOUNTER — Encounter (HOSPITAL_COMMUNITY): Payer: Self-pay

## 2016-11-08 DIAGNOSIS — Z01812 Encounter for preprocedural laboratory examination: Secondary | ICD-10-CM | POA: Insufficient documentation

## 2016-11-08 DIAGNOSIS — Z01810 Encounter for preprocedural cardiovascular examination: Secondary | ICD-10-CM

## 2016-11-08 DIAGNOSIS — I10 Essential (primary) hypertension: Secondary | ICD-10-CM | POA: Diagnosis not present

## 2016-11-08 DIAGNOSIS — F329 Major depressive disorder, single episode, unspecified: Secondary | ICD-10-CM | POA: Diagnosis not present

## 2016-11-08 DIAGNOSIS — M23322 Other meniscus derangements, posterior horn of medial meniscus, left knee: Secondary | ICD-10-CM | POA: Diagnosis not present

## 2016-11-08 DIAGNOSIS — M1712 Unilateral primary osteoarthritis, left knee: Secondary | ICD-10-CM | POA: Diagnosis not present

## 2016-11-08 DIAGNOSIS — J45909 Unspecified asthma, uncomplicated: Secondary | ICD-10-CM | POA: Diagnosis not present

## 2016-11-08 DIAGNOSIS — K219 Gastro-esophageal reflux disease without esophagitis: Secondary | ICD-10-CM | POA: Diagnosis not present

## 2016-11-08 HISTORY — DX: Unspecified asthma, uncomplicated: J45.909

## 2016-11-08 LAB — BASIC METABOLIC PANEL
ANION GAP: 9 (ref 5–15)
BUN: 14 mg/dL (ref 6–20)
CHLORIDE: 103 mmol/L (ref 101–111)
CO2: 25 mmol/L (ref 22–32)
CREATININE: 0.79 mg/dL (ref 0.61–1.24)
Calcium: 9.6 mg/dL (ref 8.9–10.3)
GFR calc non Af Amer: >60 mL/min (ref >60)
Glucose, Bld: 86 mg/dL (ref 65–99)
Potassium: 3.7 mmol/L (ref 3.5–5.1)
Sodium: 137 mmol/L (ref 135–145)

## 2016-11-08 LAB — CBC
HEMATOCRIT: 40.9 % (ref 39.0–52.0)
HEMOGLOBIN: 14 g/dL (ref 13.0–17.0)
MCH: 31.5 pg (ref 26.0–34.0)
MCHC: 34.2 g/dL (ref 30.0–36.0)
MCV: 91.9 fL (ref 78.0–100.0)
Platelets: 200 10*3/uL (ref 150–400)
RBC: 4.45 MIL/uL (ref 4.22–5.81)
RDW: 12.7 % (ref 11.5–15.5)
WBC: 7.6 10*3/uL (ref 4.0–10.5)

## 2016-11-09 DIAGNOSIS — M549 Dorsalgia, unspecified: Secondary | ICD-10-CM | POA: Diagnosis not present

## 2016-11-09 DIAGNOSIS — I10 Essential (primary) hypertension: Secondary | ICD-10-CM | POA: Diagnosis not present

## 2016-11-09 DIAGNOSIS — E785 Hyperlipidemia, unspecified: Secondary | ICD-10-CM | POA: Diagnosis not present

## 2016-11-10 NOTE — H&P (Signed)
Chief Complaint  Patient presents with  . Knee Pain      Left knee pain, referred from Dr. Luna Glasgow for surgery.    Preoperative history and physical   HPI 50 year old male presents for History and physical for arthroscopy left knee for torn medial meniscus diagnosed on MRI. His symptoms include pain on the medial joint line and mechanical symptoms post stepping in a hole some time ago. X-rays did show some mild degenerative changes and an effusion   He did have nonoperative treatment with Dr. Luna Glasgow which failed to relieve his symptoms  I gave him an injection and that did not help his pain   HPI as reported by Dr. Luna Glasgow   He stepped in a hole about four months ago and hurt his left knee and his left ankle.  He has been followed at the [facility] in [place] since then.  The last records I have from them were dated 06-30-16.   He has giving way of the left knee. He has swelling and popping.  He has no redness.  He has used heat, ice, rest.  He now uses a knee brace which helps.       He notes that his pain is in his medial calf posterior leg radiates down to his medial ankle without any back pain, but he does have history of arthritis in his back.     ROS Review of Systems as previously recorded by Dr. Luna Glasgow HENT: Negative for congestion.   Respiratory: Negative for cough and shortness of breath.   Cardiovascular: Negative for chest pain and leg swelling.  Endocrine: Positive for cold intolerance.  Musculoskeletal: Positive for arthralgias, gait problem and joint swelling.  Allergic/Immunologic: Positive for environmental allergies.  The remaining systems were reviewed and were reported as negative patient  History reviewed. No pertinent past medical history. The patient reports no medical history and he says he's never had surgery   He has no family history of any serious medical illnesses or bleeding disorders        Social History  Substance Use Topics  .  Smoking status: Never Smoker  . Smokeless tobacco: Current User      Types: Chew  . Alcohol use Not on file    Active Medications  No outpatient prescriptions have been marked as taking for the 09/03/16 encounter (Office Visit) with Carole Civil, MD.        BP 136/76   Pulse (!) 56   Ht 6' (1.829 m)   Wt 215 lb (97.5 kg)   BMI 29.16 kg/m    Physical Exam  Constitutional: He is oriented to person, place, and time. He appears well-developed and well-nourished. No distress.  Cardiovascular: Normal rate and intact distal pulses.   Neurological: He is alert and oriented to person, place, and time.  Skin: Skin is warm and dry. No rash noted. He is not diaphoretic. No erythema. No pallor.  Psychiatric: He has a normal mood and affect. His behavior is normal. Judgment and thought content normal.      Ortho Exam he does have a slight limp when he walks   Right knee range of motion stability and strength normal skin without rash lesion or ulceration tenderness or swelling no peripheral edema normal sensation   Left knee medial joint line tenderness. On straight leg raising he had pain radiating from his posterior knee area across the medial calf muscle down into the left ankle without numbness or tingling   He  had no joint effusion is range of motion was full his leg is stable in terms of knee ligaments and strength muscle tone were normal skin was intact without rash or erythema   The patient's upper extremities are normal with normal alignment no swelling or tenderness normal range of motion stability strength scan pulses and perfusion and sensation   ASSESSMENT: My personal interpretation of the images:  Plain films are really unremarkable he may have some symmetric joint space narrowing but no secondary bone changes.   His MRI shows a small medial meniscal tear on the free edge     I'm concerned that his pain is out of proportion to his imaging findings and exam findings. I  think he may have some lumbar spine related pain in his left leg. This is been related to the patient. However he would like to proceed with arthroscopy to evaluate meniscus for tear possible medial meniscectomy understanding that he may have persistent pain postoperatively  Diagnosis torn medial meniscus left knee  MRI findings IMPRESSION: 1. Small free edge tear of the posterior horn medial meniscus. 2. Mild degenerative chondral thinning in the lateral compartment.     Electronically Signed   By: Van Clines M.D.   On: 08/26/2016 11:35   Arthroscopy left knee partial medial meniscectomy

## 2016-11-11 ENCOUNTER — Ambulatory Visit (HOSPITAL_COMMUNITY)
Admission: RE | Admit: 2016-11-11 | Discharge: 2016-11-11 | Disposition: A | Discharge status: Home / Self Care | Payer: Medicare Other | Source: Ambulatory Visit | Attending: Orthopedic Surgery | Admitting: Orthopedic Surgery

## 2016-11-11 ENCOUNTER — Encounter (HOSPITAL_COMMUNITY)
Admission: RE | Disposition: A | Discharge status: Home / Self Care | Payer: Self-pay | Source: Ambulatory Visit | Attending: Orthopedic Surgery

## 2016-11-11 ENCOUNTER — Ambulatory Visit (HOSPITAL_COMMUNITY): Payer: Medicare Other | Admitting: Anesthesiology

## 2016-11-11 ENCOUNTER — Encounter (HOSPITAL_COMMUNITY): Payer: Self-pay

## 2016-11-11 DIAGNOSIS — F329 Major depressive disorder, single episode, unspecified: Secondary | ICD-10-CM | POA: Insufficient documentation

## 2016-11-11 DIAGNOSIS — M23322 Other meniscus derangements, posterior horn of medial meniscus, left knee: Secondary | ICD-10-CM | POA: Diagnosis not present

## 2016-11-11 DIAGNOSIS — S83242A Other tear of medial meniscus, current injury, left knee, initial encounter: Secondary | ICD-10-CM | POA: Diagnosis not present

## 2016-11-11 DIAGNOSIS — K219 Gastro-esophageal reflux disease without esophagitis: Secondary | ICD-10-CM | POA: Insufficient documentation

## 2016-11-11 DIAGNOSIS — J45909 Unspecified asthma, uncomplicated: Secondary | ICD-10-CM | POA: Diagnosis not present

## 2016-11-11 DIAGNOSIS — I10 Essential (primary) hypertension: Secondary | ICD-10-CM | POA: Insufficient documentation

## 2016-11-11 DIAGNOSIS — M1712 Unilateral primary osteoarthritis, left knee: Secondary | ICD-10-CM | POA: Insufficient documentation

## 2016-11-11 HISTORY — PX: KNEE ARTHROSCOPY WITH MEDIAL MENISECTOMY: SHX5651

## 2016-11-11 SURGERY — ARTHROSCOPY, KNEE, WITH MEDIAL MENISCECTOMY
Anesthesia: General | Laterality: Left

## 2016-11-11 MED ORDER — HYDROCODONE-ACETAMINOPHEN 5-325 MG PO TABS: 1.0000 | ORAL_TABLET | ORAL | 0 refills | Status: DC | PRN

## 2016-11-11 MED ORDER — SODIUM CHLORIDE 0.9 % IR SOLN
Status: DC | PRN
  Administered 2016-11-11: 1000 mL

## 2016-11-11 MED ORDER — PROPOFOL 10 MG/ML IV BOLUS
INTRAVENOUS | Status: AC
  Filled 2016-11-11: qty 20

## 2016-11-11 MED ORDER — EPHEDRINE SULFATE 50 MG/ML IJ SOLN
INTRAMUSCULAR | Status: DC | PRN
  Administered 2016-11-11: 10 mg via INTRAVENOUS

## 2016-11-11 MED ORDER — MIDAZOLAM HCL 2 MG/2ML IJ SOLN
INTRAMUSCULAR | Status: AC
  Filled 2016-11-11: qty 2

## 2016-11-11 MED ORDER — PROPOFOL 10 MG/ML IV BOLUS
INTRAVENOUS | Status: DC | PRN
  Administered 2016-11-11: 140 mg via INTRAVENOUS

## 2016-11-11 MED ORDER — FENTANYL CITRATE (PF) 100 MCG/2ML IJ SOLN
25.0000 ug | INTRAMUSCULAR | Status: AC | PRN
  Administered 2016-11-11 (×2): 25 ug via INTRAVENOUS
  Filled 2016-11-11: qty 2

## 2016-11-11 MED ORDER — LACTATED RINGERS IV SOLN
INTRAVENOUS | Status: DC
  Administered 2016-11-11: 13:00:00 via INTRAVENOUS

## 2016-11-11 MED ORDER — MIDAZOLAM HCL 5 MG/5ML IJ SOLN
INTRAMUSCULAR | Status: DC | PRN
  Administered 2016-11-11: 2 mg via INTRAVENOUS

## 2016-11-11 MED ORDER — CHLORHEXIDINE GLUCONATE 4 % EX LIQD: 60.0000 mL | Freq: Once | CUTANEOUS | Status: DC

## 2016-11-11 MED ORDER — ATROPINE SULFATE 0.4 MG/ML IJ SOLN
INTRAMUSCULAR | Status: DC | PRN
  Administered 2016-11-11: 0.4 mg via INTRAVENOUS

## 2016-11-11 MED ORDER — CEFAZOLIN SODIUM-DEXTROSE 2-4 GM/100ML-% IV SOLN
2.0000 g | INTRAVENOUS | Status: AC
  Administered 2016-11-11: 2 g via INTRAVENOUS

## 2016-11-11 MED ORDER — NEOSTIGMINE METHYLSULFATE 10 MG/10ML IV SOLN
INTRAVENOUS | Status: AC
  Filled 2016-11-11: qty 1

## 2016-11-11 MED ORDER — SODIUM CHLORIDE 0.9 % IR SOLN
Status: DC | PRN
  Administered 2016-11-11 (×2): 3000 mL

## 2016-11-11 MED ORDER — LIDOCAINE HCL 1 % IJ SOLN
INTRAMUSCULAR | Status: DC | PRN
  Administered 2016-11-11: 25 mg via INTRADERMAL

## 2016-11-11 MED ORDER — FENTANYL CITRATE (PF) 100 MCG/2ML IJ SOLN
INTRAMUSCULAR | Status: DC | PRN
  Administered 2016-11-11 (×2): 50 ug via INTRAVENOUS

## 2016-11-11 MED ORDER — MIDAZOLAM HCL 2 MG/2ML IJ SOLN
1.0000 mg | INTRAMUSCULAR | Status: DC | PRN
  Administered 2016-11-11: 2 mg via INTRAVENOUS

## 2016-11-11 MED ORDER — BUPIVACAINE-EPINEPHRINE (PF) 0.5% -1:200000 IJ SOLN
INTRAMUSCULAR | Status: DC | PRN
  Administered 2016-11-11: 60 mL

## 2016-11-11 MED ORDER — GLYCOPYRROLATE 0.2 MG/ML IJ SOLN
INTRAMUSCULAR | Status: AC
  Filled 2016-11-11: qty 1

## 2016-11-11 MED ORDER — GLYCOPYRROLATE 0.2 MG/ML IJ SOLN
INTRAMUSCULAR | Status: DC | PRN
  Administered 2016-11-11: 0.2 mg via INTRAVENOUS

## 2016-11-11 MED ORDER — FENTANYL CITRATE (PF) 100 MCG/2ML IJ SOLN
INTRAMUSCULAR | Status: AC
  Filled 2016-11-11: qty 2

## 2016-11-11 MED ORDER — CEFAZOLIN SODIUM-DEXTROSE 2-4 GM/100ML-% IV SOLN
INTRAVENOUS | Status: AC
  Filled 2016-11-11: qty 100

## 2016-11-11 MED ORDER — EPINEPHRINE PF 1 MG/ML IJ SOLN
INTRAMUSCULAR | Status: AC
  Filled 2016-11-11: qty 5

## 2016-11-11 SURGICAL SUPPLY — 47 items
BAG HAMPER (MISCELLANEOUS) ×3 IMPLANT
BANDAGE ELASTIC 6 LF NS (GAUZE/BANDAGES/DRESSINGS) ×3 IMPLANT
BANDAGE ELASTIC 6 VELCRO NS (GAUZE/BANDAGES/DRESSINGS) ×3 IMPLANT
BLADE AGGRESSIVE PLUS 4.0 (BLADE) ×3 IMPLANT
BLADE SURG SZ11 CARB STEEL (BLADE) ×3 IMPLANT
CHLORAPREP W/TINT 26ML (MISCELLANEOUS) ×6 IMPLANT
CLOTH BEACON ORANGE TIMEOUT ST (SAFETY) ×3 IMPLANT
COOLER CRYO IC GRAV AND TUBE (ORTHOPEDIC SUPPLIES) ×3 IMPLANT
CUFF CRYO KNEE18X23 MED (MISCELLANEOUS) ×3 IMPLANT
CUFF TOURNIQUET SINGLE 34IN LL (TOURNIQUET CUFF) ×3 IMPLANT
DECANTER SPIKE VIAL GLASS SM (MISCELLANEOUS) ×6 IMPLANT
GAUZE SPONGE 4X4 12PLY STRL (GAUZE/BANDAGES/DRESSINGS) ×3 IMPLANT
GAUZE SPONGE 4X4 16PLY XRAY LF (GAUZE/BANDAGES/DRESSINGS) ×3 IMPLANT
GAUZE XEROFORM 5X9 LF (GAUZE/BANDAGES/DRESSINGS) ×3 IMPLANT
GLOVE BIOGEL PI IND STRL 7.0 (GLOVE) ×4 IMPLANT
GLOVE BIOGEL PI INDICATOR 7.0 (GLOVE) ×8
GLOVE ECLIPSE 6.5 STRL STRAW (GLOVE) ×3 IMPLANT
GLOVE SKINSENSE NS SZ8.0 LF (GLOVE) ×2
GLOVE SKINSENSE STRL SZ8.0 LF (GLOVE) ×1 IMPLANT
GLOVE SS N UNI LF 8.5 STRL (GLOVE) ×3 IMPLANT
GOWN STRL REUS W/ TWL LRG LVL3 (GOWN DISPOSABLE) ×1 IMPLANT
GOWN STRL REUS W/TWL LRG LVL3 (GOWN DISPOSABLE) ×3
GOWN STRL REUS W/TWL XL LVL3 (GOWN DISPOSABLE) ×3 IMPLANT
HLDR LEG FOAM (MISCELLANEOUS) ×1 IMPLANT
IV NS IRRIG 3000ML ARTHROMATIC (IV SOLUTION) ×6 IMPLANT
KIT BLADEGUARD II DBL (SET/KITS/TRAYS/PACK) ×3 IMPLANT
KIT ROOM TURNOVER AP CYSTO (KITS) ×3 IMPLANT
LEG HOLDER FOAM (MISCELLANEOUS) ×2
MANIFOLD NEPTUNE II (INSTRUMENTS) ×3 IMPLANT
MARKER SKIN DUAL TIP RULER LAB (MISCELLANEOUS) ×3 IMPLANT
NEEDLE HYPO 18GX1.5 BLUNT FILL (NEEDLE) ×3 IMPLANT
NEEDLE HYPO 21X1.5 SAFETY (NEEDLE) ×3 IMPLANT
NEEDLE SPNL 18GX3.5 QUINCKE PK (NEEDLE) ×3 IMPLANT
NS IRRIG 1000ML POUR BTL (IV SOLUTION) ×3 IMPLANT
PACK ARTHRO LIMB DRAPE STRL (MISCELLANEOUS) ×3 IMPLANT
PAD ABD 5X9 TENDERSORB (GAUZE/BANDAGES/DRESSINGS) ×3 IMPLANT
PAD ARMBOARD 7.5X6 YLW CONV (MISCELLANEOUS) ×3 IMPLANT
PADDING CAST COTTON 6X4 STRL (CAST SUPPLIES) ×3 IMPLANT
SET ARTHROSCOPY INST (INSTRUMENTS) ×3 IMPLANT
SET ARTHROSCOPY PUMP TUBE (IRRIGATION / IRRIGATOR) ×3 IMPLANT
SET BASIN LINEN APH (SET/KITS/TRAYS/PACK) ×3 IMPLANT
SUT ETHILON 3 0 FSL (SUTURE) ×3 IMPLANT
SYR 30ML LL (SYRINGE) ×3 IMPLANT
SYRINGE 10CC LL (SYRINGE) ×3 IMPLANT
TUBE CONNECTING 12'X1/4 (SUCTIONS) ×3
TUBE CONNECTING 12X1/4 (SUCTIONS) ×6 IMPLANT
WAND 50 DEG COVAC W/CORD (SURGICAL WAND) ×3 IMPLANT

## 2016-11-11 NOTE — Interval H&P Note (Signed)
History and Physical Interval Note:  11/11/2016 11:13 AM  [place]  has presented today for surgery, with the diagnosis of left medial meniscus tear  The various methods of treatment have been discussed with the patient and family. After consideration of risks, benefits and other options for treatment, the patient has consented to  Procedure(s): KNEE ARTHROSCOPY WITH MEDIAL MENISECTOMY (Left) as a surgical intervention .  The patient's history has been reviewed, patient examined, no change in status, stable for surgery.  I have reviewed the patient's chart and labs.  Questions were answered to the patient's satisfaction.     Arther Abbott

## 2016-11-11 NOTE — Anesthesia Postprocedure Evaluation (Signed)
Anesthesia Post Note  Patient: Bradley Fisher  Procedure(s) Performed: Procedure(s) (LRB): LEFT KNEE ARTHROSCOPY WITH MEDIAL MENISECTOMY (Left)  Patient location during evaluation: PACU Anesthesia Type: General Level of consciousness: awake and alert, oriented and patient cooperative Pain management: pain level controlled Vital Signs Assessment: post-procedure vital signs reviewed and stable Respiratory status: spontaneous breathing, nonlabored ventilation and respiratory function stable Cardiovascular status: blood pressure returned to baseline and stable Postop Assessment: no signs of nausea or vomiting Anesthetic complications: no    Last Vitals:  Vitals:   11/11/16 1122 11/11/16 1125  BP: 122/86 125/82  Pulse: 60   Resp: (!) 24 (!) 22  Temp: 36.8 C     Last Pain:  Vitals:   11/11/16 1122  TempSrc: Oral                 [name] J

## 2016-11-11 NOTE — Op Note (Signed)
11/11/2016 2:22 PM Bradley Abbott, MD  Knee arthroscopy dictation  Preop diagnosis left knee medial meniscus tear  Postop diagnosis same  Procedure arthroscopy partial medial meniscectomy and meniscal rim abrasion  Surgeon Aline Brochure  No assistants  Gen. anesthesia    The patient was identified in the preoperative holding area using 2 approved identification mechanisms. The chart was reviewed and updated. The surgical site was confirmed as left knee and marked with an indelible marker.  The patient was taken to the operating room for anesthesia. After successful  general anesthesia, 2 g Ancef was used as IV antibiotics.  The patient was placed in the supine position with the (left) the operative extremity in an arthroscopic leg holder and the opposite extremity in a padded leg holder.  The timeout was executed.  A lateral portal was established with an 11 blade and the scope was introduced into the joint. A diagnostic arthroscopy was performed in circumferential manner examining the entire knee joint. A medial portal was established and the diagnostic arthroscopy was repeated using a probe to palpate intra-articular structures as they were encountered.   The operative findings are   Medial torn medial meniscus free edge and undersurface tear. Undersurface tear it was at the peripheral rim and was less than a centimeter in stable Lateral normal Patellofemoral normal Notch normal anterior cruciate ligament PCL   The medial meniscus was resected using a duckbill forceps. The meniscal fragments were removed with a motorized shaver. The meniscus was balanced with a combination of a motorized shaver and a 50 ArthroCare wand until a stable rim was obtained.  I then took the arthroscopic shaver and did an abrasion of the peripheral rim to stimulate bleeding the meniscal tear was less than a centimeter at this point  The arthroscopic pump was placed on the wash mode and any excess  debris was removed from the joint using suction.  60 cc of Marcaine with epinephrine was injected through the arthroscope.  The portals were closed with 3-0 nylon suture.  A sterile bandage, Ace wrap and Cryo/Cuff was placed and the Cryo/Cuff was activated. The patient was taken to the recovery room in stable condition.  [place]

## 2016-11-11 NOTE — Transfer of Care (Signed)
Immediate Anesthesia Transfer of Care Note  Patient: Bradley Fisher  Procedure(s) Performed: Procedure(s): LEFT KNEE ARTHROSCOPY WITH MEDIAL MENISECTOMY (Left)  Patient Location: PACU  Anesthesia Type:General  Level of Consciousness: awake and patient cooperative  Airway & Oxygen Therapy: Patient Spontanous Breathing and Patient connected to face mask oxygen  Post-op Assessment: Report given to RN, Post -op Vital signs reviewed and stable and Patient moving all extremities  Post vital signs: Reviewed and stable  Last Vitals:  Vitals:   11/11/16 1122 11/11/16 1125  BP: 122/86 125/82  Pulse: 60   Resp: (!) 24 (!) 22  Temp: 36.8 C     Last Pain:  Vitals:   11/11/16 1122  TempSrc: Oral      Patients Stated Pain Goal: 9 ([id number])  Complications: No apparent anesthesia complications

## 2016-11-11 NOTE — Anesthesia Procedure Notes (Signed)
Procedure Name: LMA Insertion Date/Time: 11/11/2016 1:40 PM Performed by: Charmaine Downs Pre-anesthesia Checklist: Patient identified, Patient being monitored, Emergency Drugs available, Timeout performed and Suction available Patient Re-evaluated:Patient Re-evaluated prior to inductionOxygen Delivery Method: Circle System Utilized Preoxygenation: Pre-oxygenation with 100% oxygen Intubation Type: IV induction Ventilation: Mask ventilation without difficulty LMA: LMA inserted LMA Size: 4.0 Number of attempts: 1 Placement Confirmation: positive ETCO2 and breath sounds checked- equal and bilateral Tube secured with: Tape Dental Injury: Teeth and Oropharynx as per pre-operative assessment

## 2016-11-11 NOTE — Interval H&P Note (Signed)
History and Physical Interval Note:  11/11/2016 11:12 AM  Bradley Fisher  has presented today for surgery, with the diagnosis of left medial meniscus tear  The various methods of treatment have been discussed with the patient and family. After consideration of risks, benefits and other options for treatment, the patient has consented to  Procedure(s): KNEE ARTHROSCOPY WITH MEDIAL MENISECTOMY (Left) as a surgical intervention .  The patient's history has been reviewed, patient examined, no change in status, stable for surgery.  I have reviewed the patient's chart and labs.  Questions were answered to the patient's satisfaction.     Arther Abbott

## 2016-11-11 NOTE — Brief Op Note (Signed)
11/11/2016  2:18 PM  PATIENT:  Bradley Fisher  50 y.o. male  PRE-OPERATIVE DIAGNOSIS:  left medial meniscus tear  POST-OPERATIVE DIAGNOSIS:  left medial meniscus tear  PROCEDURE:  Procedure(s): LEFT KNEE ARTHROSCOPY WITH MEDIAL MENISECTOMY (Left)  SURGEON:  Surgeon(s) and Role:    * Carole Civil, MD - Primary  PHYSICIAN ASSISTANT:   ASSISTANTS: none   ANESTHESIA:   general  EBL:  Total I/O In: 700 [I.V.:700] Out: 5 [Blood:5]  BLOOD ADMINISTERED:none  DRAINS: none   LOCAL MEDICATIONS USED:  MARCAINE     SPECIMEN:  No Specimen  DISPOSITION OF SPECIMEN:  N/A  COUNTS:  YES  TOURNIQUET:    DICTATION: .Dragon Dictation  PLAN OF CARE: Discharge to home after PACU  PATIENT DISPOSITION:  PACU - hemodynamically stable.   Delay start of Pharmacological VTE agent (>24hrs) due to surgical blood loss or risk of bleeding: not applicable

## 2016-11-11 NOTE — Anesthesia Preprocedure Evaluation (Signed)
Anesthesia Evaluation  Patient identified by MRN, date of birth, ID band Patient awake    Reviewed: Allergy & Precautions, NPO status , Patient's Chart, lab work & pertinent test results  Airway Mallampati: I  TM Distance: >3 FB     Dental  (+) Edentulous Upper, Edentulous Lower   Pulmonary asthma ,    breath sounds clear to auscultation       Cardiovascular hypertension, Pt. on medications  Rhythm:Regular Rate:Normal     Neuro/Psych Seizures -, Well Controlled,  PSYCHIATRIC DISORDERS Depression    GI/Hepatic GERD  ,  Endo/Other    Renal/GU      Musculoskeletal  (+) Arthritis ,   Abdominal   Peds  Hematology   Anesthesia Other Findings   Reproductive/Obstetrics                             Anesthesia Physical Anesthesia Plan  ASA: II  Anesthesia Plan: General   Post-op Pain Management:    Induction: Intravenous  Airway Management Planned: LMA  Additional Equipment:   Intra-op Plan:   Post-operative Plan: Extubation in OR  Informed Consent: I have reviewed the patients History and Physical, chart, labs and discussed the procedure including the risks, benefits and alternatives for the proposed anesthesia with the patient or authorized representative who has indicated his/her understanding and acceptance.     Plan Discussed with:   Anesthesia Plan Comments:         Anesthesia Quick Evaluation

## 2016-11-12 ENCOUNTER — Encounter (HOSPITAL_COMMUNITY): Payer: Self-pay | Admitting: Orthopedic Surgery

## 2016-11-15 ENCOUNTER — Ambulatory Visit: Payer: Medicare Other | Admitting: Orthopedic Surgery

## 2016-11-17 ENCOUNTER — Encounter: Payer: Self-pay | Admitting: Orthopedic Surgery

## 2016-11-17 ENCOUNTER — Ambulatory Visit (INDEPENDENT_AMBULATORY_CARE_PROVIDER_SITE_OTHER): Payer: Self-pay | Admitting: Orthopedic Surgery

## 2016-11-17 DIAGNOSIS — Z4889 Encounter for other specified surgical aftercare: Secondary | ICD-10-CM

## 2016-11-17 DIAGNOSIS — Z9889 Other specified postprocedural states: Secondary | ICD-10-CM

## 2016-11-17 NOTE — Progress Notes (Signed)
Patient ID: Bradley Fisher, male   DOB: May 26, 1966, 50 y.o.   MRN: [id number]  Post op visit   Chief Complaint  Patient presents with  . Follow-up    post op 1, SALK MM, DOS 11/11/16    Knee arthroscopy dictation   Preop diagnosis left knee medial meniscus tear   Postop diagnosis same   Procedure arthroscopy partial medial meniscectomy and meniscal rim abrasion   Surgeon Aline Brochure  The operative findings are    Medial torn medial meniscus free edge and undersurface tear. Undersurface tear it was at the peripheral rim and was less than a centimeter in stable Lateral normal Patellofemoral normal Notch normal anterior cruciate ligament PCL  No complaints   hes walking 1 mile daily   FROM In the left knee. No swelling. Portals look clean sutures were removed  He will continue with home therapy follow-up with me in one month  Encounter Diagnoses  Name Primary?  Marland Kitchen Aftercare following surgery Yes  . S/P arthroscopy of left knee

## 2016-12-17 ENCOUNTER — Encounter: Payer: Self-pay | Admitting: Orthopedic Surgery

## 2016-12-17 ENCOUNTER — Other Ambulatory Visit: Payer: Self-pay | Admitting: Physician Assistant

## 2016-12-17 ENCOUNTER — Ambulatory Visit (INDEPENDENT_AMBULATORY_CARE_PROVIDER_SITE_OTHER): Payer: Self-pay | Admitting: Orthopedic Surgery

## 2016-12-17 DIAGNOSIS — Z4889 Encounter for other specified surgical aftercare: Secondary | ICD-10-CM

## 2016-12-17 DIAGNOSIS — Z9889 Other specified postprocedural states: Secondary | ICD-10-CM

## 2016-12-17 MED ORDER — HYDROCODONE-ACETAMINOPHEN 5-325 MG PO TABS: 1.0000 | ORAL_TABLET | ORAL | 0 refills | Status: DC | PRN

## 2016-12-17 MED ORDER — HYDROCODONE-ACETAMINOPHEN 5-325 MG PO TABS: 1.0000 | ORAL_TABLET | Freq: Three times a day (TID) | ORAL | 0 refills | Status: DC | PRN

## 2016-12-17 NOTE — Telephone Encounter (Signed)
Pt hasn't been seen in our office yet. 

## 2016-12-17 NOTE — Progress Notes (Signed)
Postop status post arthroscopy left knee doing well as some residual discomfort when he is walking on cemented some mild back pain  His knee looks excellent has full range of motion no effusion and no palpable tenderness is walking without support and no limp  Recommend follow-up as needed

## 2017-01-06 ENCOUNTER — Telehealth: Payer: Self-pay | Admitting: Orthopedic Surgery

## 2017-01-06 NOTE — Telephone Encounter (Signed)
Hydrocodone-Acetaminophen  5/325mg   Qty 21 Tablets  Take 1 tablet by mouth every 8 (eight) hours as needed for moderate pain.

## 2017-01-06 NOTE — Telephone Encounter (Signed)
Routing to Dr Harrison for approval 

## 2017-01-07 NOTE — Telephone Encounter (Signed)
Declined no more opioids

## 2017-01-12 ENCOUNTER — Other Ambulatory Visit: Payer: Self-pay | Admitting: Physician Assistant

## 2017-01-28 ENCOUNTER — Other Ambulatory Visit: Payer: Self-pay

## 2017-01-28 DIAGNOSIS — I10 Essential (primary) hypertension: Secondary | ICD-10-CM

## 2017-01-28 MED ORDER — LISINOPRIL 10 MG PO TABS
10.0000 mg | ORAL_TABLET | Freq: Every day | ORAL | 0 refills | Status: DC
Start: 2017-01-28 — End: 2017-02-10

## 2017-02-07 DIAGNOSIS — F809 Developmental disorder of speech and language, unspecified: Secondary | ICD-10-CM | POA: Diagnosis not present

## 2017-02-07 DIAGNOSIS — Z79899 Other long term (current) drug therapy: Secondary | ICD-10-CM | POA: Diagnosis not present

## 2017-02-08 ENCOUNTER — Other Ambulatory Visit: Payer: Self-pay | Admitting: Physician Assistant

## 2017-02-10 ENCOUNTER — Other Ambulatory Visit: Payer: Self-pay

## 2017-02-10 ENCOUNTER — Ambulatory Visit (INDEPENDENT_AMBULATORY_CARE_PROVIDER_SITE_OTHER): Payer: Medicare Other | Admitting: Physician Assistant

## 2017-02-10 ENCOUNTER — Encounter: Payer: Self-pay | Admitting: Physician Assistant

## 2017-02-10 VITALS — BP 130/80 | HR 52 | Temp 97.8°F | Ht 72.0 in | Wt 225.4 lb

## 2017-02-10 DIAGNOSIS — M1990 Unspecified osteoarthritis, unspecified site: Secondary | ICD-10-CM

## 2017-02-10 DIAGNOSIS — Z9889 Other specified postprocedural states: Secondary | ICD-10-CM

## 2017-02-10 DIAGNOSIS — Z0000 Encounter for general adult medical examination without abnormal findings: Secondary | ICD-10-CM

## 2017-02-10 DIAGNOSIS — Z4889 Encounter for other specified surgical aftercare: Secondary | ICD-10-CM | POA: Diagnosis not present

## 2017-02-10 DIAGNOSIS — F339 Major depressive disorder, recurrent, unspecified: Secondary | ICD-10-CM | POA: Diagnosis not present

## 2017-02-10 DIAGNOSIS — I10 Essential (primary) hypertension: Secondary | ICD-10-CM | POA: Diagnosis not present

## 2017-02-10 DIAGNOSIS — Z125 Encounter for screening for malignant neoplasm of prostate: Secondary | ICD-10-CM

## 2017-02-10 MED ORDER — LISINOPRIL 10 MG PO TABS: 10.0000 mg | ORAL_TABLET | Freq: Every day | ORAL | 1 refills | Status: DC

## 2017-02-10 MED ORDER — SERTRALINE HCL 100 MG PO TABS: 100.0000 mg | ORAL_TABLET | Freq: Every day | ORAL | 0 refills | Status: DC

## 2017-02-10 MED ORDER — HYDROCODONE-ACETAMINOPHEN 5-325 MG PO TABS: 1.0000 | ORAL_TABLET | Freq: Three times a day (TID) | ORAL | 0 refills | Status: DC | PRN

## 2017-02-10 MED ORDER — LAMOTRIGINE 150 MG PO TABS: 150.0000 mg | ORAL_TABLET | Freq: Two times a day (BID) | ORAL | 1 refills | Status: DC

## 2017-02-10 MED ORDER — LISINOPRIL 10 MG PO TABS: 10.0000 mg | ORAL_TABLET | Freq: Every day | ORAL | 0 refills | Status: DC

## 2017-02-10 MED ORDER — OMEPRAZOLE 20 MG PO CPDR: DELAYED_RELEASE_CAPSULE | ORAL | 1 refills | Status: DC

## 2017-02-10 NOTE — Patient Instructions (Signed)
Arthritis Arthritis means joint pain. It can also mean joint disease. A joint is a place where bones come together. People who have arthritis may have:  Red joints.  Swollen joints.  Stiff joints.  Warm joints.  A fever.  A feeling of being sick. Follow these instructions at home: Pay attention to any changes in your symptoms. Take these actions to help with your pain and swelling. Medicines   Take over-the-counter and prescription medicines only as told by your doctor.  Do not take aspirin for pain if your doctor says that you may have gout. Activity   Rest your joint if your doctor tells you to.  Avoid activities that make the pain worse.  Exercise your joint regularly as told by your doctor. Try doing exercises like:  Swimming.  Water aerobics.  Biking.  Walking. Joint Care    If your joint is swollen, keep it raised (elevated) if told by your doctor.  If your joint feels stiff in the morning, try taking a warm shower.  If you have diabetes, do not apply heat without asking your doctor.  If told, apply heat to the joint:  Put a towel between the joint and the hot pack or heating pad.  Leave the heat on the area for 20-30 minutes.  If told, apply ice to the joint:  Put ice in a plastic bag.  Place a towel between your skin and the bag.  Leave the ice on for 20 minutes, 2-3 times per day.  Keep all follow-up visits as told by your doctor. Contact a doctor if:  The pain gets worse.  You have a fever. Get help right away if:  You have very bad pain in your joint.  You have swelling in your joint.  Your joint is red.  Many joints become painful and swollen.  You have very bad back pain.  Your leg is very weak.  You cannot control your pee (urine) or poop (stool). This information is not intended to replace advice given to you by your health care provider. Make sure you discuss any questions you have with your health care  provider. Document Released: 02/23/2010 Document Revised: 05/06/2016 Document Reviewed: 02/24/2015 Elsevier Interactive Patient Education  2017 Elsevier Inc.  

## 2017-02-11 ENCOUNTER — Ambulatory Visit: Payer: Medicare Other | Admitting: Physician Assistant

## 2017-02-11 DIAGNOSIS — Z0000 Encounter for general adult medical examination without abnormal findings: Secondary | ICD-10-CM | POA: Insufficient documentation

## 2017-02-11 DIAGNOSIS — Z9889 Other specified postprocedural states: Secondary | ICD-10-CM | POA: Insufficient documentation

## 2017-02-11 DIAGNOSIS — M1990 Unspecified osteoarthritis, unspecified site: Secondary | ICD-10-CM | POA: Insufficient documentation

## 2017-02-11 DIAGNOSIS — F339 Major depressive disorder, recurrent, unspecified: Secondary | ICD-10-CM | POA: Insufficient documentation

## 2017-02-11 DIAGNOSIS — Z125 Encounter for screening for malignant neoplasm of prostate: Secondary | ICD-10-CM | POA: Insufficient documentation

## 2017-02-11 NOTE — Progress Notes (Signed)
BP 130/80   Pulse (!) 52   Temp 97.8 F (36.6 C) (Oral)   Ht 6' (1.829 m)   Wt 225 lb 6.4 oz (102.2 kg)   BMI 30.57 kg/m    Subjective:    Patient ID: Bradley Fisher, male    DOB: June 27, 1966, 51 y.o.   MRN: [id number]  HPI: Bradley Fisher is a 51 y.o. male presenting on 02/10/2017 for Follow-up and Hypertension  This patient comes in for periodic recheck on medications and conditions including Hypertension, arthritis, depression, and need for annual well labs.   All medications are reviewed today. There are no reports of any problems with the medications. All of the medical conditions are reviewed and updated.  Lab work is reviewed and will be ordered as medically necessary. There are no new problems reported with today's visit.  Relevant past medical, surgical, family and social history reviewed and updated as indicated. Allergies and medications reviewed and updated.  Past Medical History:  Diagnosis Date  . Arthritis   . Asthma   . Depression   . GERD (gastroesophageal reflux disease)   . Hyperlipidemia   . Hypertension   . Seizures ([place])    unknow etiology; on meds for this; no seizures since on meds 10 years ago    Past Surgical History:  Procedure Laterality Date  . KNEE ARTHROSCOPY WITH MEDIAL MENISECTOMY Left 11/11/2016   Procedure: LEFT KNEE ARTHROSCOPY WITH MEDIAL MENISECTOMY;  Surgeon: Carole Civil, MD;  Location: [facility];  Service: Orthopedics;  Laterality: Left;    Review of Systems  Constitutional: Negative.  Negative for appetite change and fatigue.  HENT: Negative.   Eyes: Negative.  Negative for pain and visual disturbance.  Respiratory: Negative.  Negative for cough, chest tightness, shortness of breath and wheezing.   Cardiovascular: Negative.  Negative for chest pain, palpitations and leg swelling.  Gastrointestinal: Negative.  Negative for abdominal pain, diarrhea, nausea and vomiting.  Endocrine: Negative.   Genitourinary: Negative.     Musculoskeletal: Positive for arthralgias and back pain.  Skin: Negative.  Negative for color change and rash.  Neurological: Negative.  Negative for weakness, numbness and headaches.  Psychiatric/Behavioral: Negative.     Allergies as of 02/10/2017   No Known Allergies     Medication List       Accurate as of 02/10/17 11:59 PM. Always use your most recent med list.          atorvastatin 10 MG tablet Commonly known as:  LIPITOR Take 1 Tablet by mouth once daily   HYDROcodone-acetaminophen 5-325 MG tablet Commonly known as:  NORCO/VICODIN Take 1 tablet by mouth every 8 (eight) hours as needed for moderate pain.   lamoTRIgine 150 MG tablet Commonly known as:  LAMICTAL Take 1 tablet (150 mg total) by mouth 2 (two) times daily.   lisinopril 10 MG tablet Commonly known as:  PRINIVIL,ZESTRIL Take 1 tablet (10 mg total) by mouth daily.   loratadine 10 MG tablet Commonly known as:  CLARITIN Take 10 mg by mouth daily.   meloxicam 15 MG tablet Commonly known as:  MOBIC Take 15 mg by mouth daily.   omeprazole 20 MG capsule Commonly known as:  PRILOSEC Take 1 Capsule by mouth 2 times a day BEFORE meals   PROAIR HFA 108 (90 Base) MCG/ACT inhaler Generic drug:  albuterol Inhale 2 puffs into the lungs every 6 (six) hours as needed for wheezing or shortness of breath.   sertraline 100 MG tablet Commonly  known as:  ZOLOFT Take 1 tablet (100 mg total) by mouth daily.          Objective:    BP 130/80   Pulse (!) 52   Temp 97.8 F (36.6 C) (Oral)   Ht 6' (1.829 m)   Wt 225 lb 6.4 oz (102.2 kg)   BMI 30.57 kg/m   No Known Allergies  Physical Exam  Constitutional: He appears well-developed and well-nourished. No distress.  HENT:  Head: Normocephalic and atraumatic.  Eyes: Conjunctivae and EOM are normal. Pupils are equal, round, and reactive to light.  Cardiovascular: Normal rate, regular rhythm and normal heart sounds.   Pulmonary/Chest: Effort normal and breath  sounds normal. No respiratory distress.  Musculoskeletal: He exhibits no edema or deformity.  Skin: Skin is warm and dry.  Psychiatric: He has a normal mood and affect. His behavior is normal.  Nursing note and vitals reviewed.       Assessment & Plan:   1. Hypertension, essential  2. Aftercare following surgery - HYDROcodone-acetaminophen (NORCO/VICODIN) 5-325 MG tablet; Take 1 tablet by mouth every 8 (eight) hours as needed for moderate pain.  Dispense: 90 tablet; Refill: 0  3. S/P arthroscopy of left knee - HYDROcodone-acetaminophen (NORCO/VICODIN) 5-325 MG tablet; Take 1 tablet by mouth every 8 (eight) hours as needed for moderate pain.  Dispense: 90 tablet; Refill: 0  4. Depression, recurrent ([place]) - sertraline (ZOLOFT) 100 MG tablet; Take 1 tablet (100 mg total) by mouth daily.  Dispense: 90 tablet; Refill: 0  5. Arthritis  6. Well adult exam - CMP14+EGFR; Future - Lipid panel; Future - TSH; Future - PSA; Future  7. Prostate cancer screening - PSA; Future   Continue all other maintenance medications as listed above.  Follow up plan: Return in about 6 months (around 08/13/2017).  Educational handout given for *arthritis  Terald Sleeper PA-C [place] [address]  [place], [place] [address] [phone]   02/11/2017, 3:26 PM

## 2017-02-14 ENCOUNTER — Other Ambulatory Visit: Payer: Self-pay

## 2017-02-14 DIAGNOSIS — M069 Rheumatoid arthritis, unspecified: Secondary | ICD-10-CM

## 2017-02-14 DIAGNOSIS — G8929 Other chronic pain: Secondary | ICD-10-CM

## 2017-02-14 DIAGNOSIS — M545 Low back pain: Secondary | ICD-10-CM

## 2017-02-14 DIAGNOSIS — M159 Polyosteoarthritis, unspecified: Secondary | ICD-10-CM

## 2017-02-14 MED ORDER — LAMOTRIGINE 150 MG PO TABS: 150.0000 mg | ORAL_TABLET | Freq: Two times a day (BID) | ORAL | 0 refills | Status: DC

## 2017-02-14 MED ORDER — MELOXICAM 15 MG PO TABS: 15.0000 mg | ORAL_TABLET | Freq: Every day | ORAL | 0 refills | Status: DC

## 2017-02-14 MED ORDER — MELOXICAM 15 MG PO TABS: 15.0000 mg | ORAL_TABLET | Freq: Every day | ORAL | 2 refills | Status: DC

## 2017-02-14 MED ORDER — OMEPRAZOLE 20 MG PO CPDR: DELAYED_RELEASE_CAPSULE | ORAL | 1 refills | Status: DC

## 2017-03-09 ENCOUNTER — Encounter: Payer: Self-pay | Admitting: Physician Assistant

## 2017-03-09 ENCOUNTER — Ambulatory Visit (INDEPENDENT_AMBULATORY_CARE_PROVIDER_SITE_OTHER): Payer: Medicare Other | Admitting: Physician Assistant

## 2017-03-09 VITALS — BP 131/75 | HR 60 | Temp 96.9°F | Ht 72.0 in | Wt 218.8 lb

## 2017-03-09 DIAGNOSIS — M545 Low back pain, unspecified: Secondary | ICD-10-CM

## 2017-03-09 DIAGNOSIS — M069 Rheumatoid arthritis, unspecified: Secondary | ICD-10-CM | POA: Insufficient documentation

## 2017-03-09 DIAGNOSIS — F339 Major depressive disorder, recurrent, unspecified: Secondary | ICD-10-CM | POA: Diagnosis not present

## 2017-03-09 DIAGNOSIS — Z9889 Other specified postprocedural states: Secondary | ICD-10-CM

## 2017-03-09 DIAGNOSIS — M159 Polyosteoarthritis, unspecified: Secondary | ICD-10-CM

## 2017-03-09 DIAGNOSIS — G40909 Epilepsy, unspecified, not intractable, without status epilepticus: Secondary | ICD-10-CM | POA: Diagnosis not present

## 2017-03-09 DIAGNOSIS — Z0000 Encounter for general adult medical examination without abnormal findings: Secondary | ICD-10-CM

## 2017-03-09 DIAGNOSIS — J301 Allergic rhinitis due to pollen: Secondary | ICD-10-CM | POA: Diagnosis not present

## 2017-03-09 DIAGNOSIS — G8929 Other chronic pain: Secondary | ICD-10-CM

## 2017-03-09 MED ORDER — MELOXICAM 15 MG PO TABS: 15.0000 mg | ORAL_TABLET | Freq: Every day | ORAL | 3 refills | Status: DC

## 2017-03-09 MED ORDER — LAMOTRIGINE 150 MG PO TABS: 150.0000 mg | ORAL_TABLET | Freq: Two times a day (BID) | ORAL | 3 refills | Status: DC

## 2017-03-09 MED ORDER — SERTRALINE HCL 100 MG PO TABS: 100.0000 mg | ORAL_TABLET | Freq: Every day | ORAL | 3 refills | Status: DC

## 2017-03-09 MED ORDER — HYDROCODONE-ACETAMINOPHEN 5-325 MG PO TABS: 1.0000 | ORAL_TABLET | Freq: Three times a day (TID) | ORAL | 0 refills | Status: DC | PRN

## 2017-03-09 MED ORDER — LORATADINE 10 MG PO TABS: 10.0000 mg | ORAL_TABLET | Freq: Every day | ORAL | 3 refills | Status: DC

## 2017-03-09 NOTE — Patient Instructions (Signed)
Arthritis Arthritis means joint pain. It can also mean joint disease. A joint is a place where bones come together. People who have arthritis may have:  Red joints.  Swollen joints.  Stiff joints.  Warm joints.  A fever.  A feeling of being sick. Follow these instructions at home: Pay attention to any changes in your symptoms. Take these actions to help with your pain and swelling. Medicines   Take over-the-counter and prescription medicines only as told by your doctor.  Do not take aspirin for pain if your doctor says that you may have gout. Activity   Rest your joint if your doctor tells you to.  Avoid activities that make the pain worse.  Exercise your joint regularly as told by your doctor. Try doing exercises like:  Swimming.  Water aerobics.  Biking.  Walking. Joint Care    If your joint is swollen, keep it raised (elevated) if told by your doctor.  If your joint feels stiff in the morning, try taking a warm shower.  If you have diabetes, do not apply heat without asking your doctor.  If told, apply heat to the joint:  Put a towel between the joint and the hot pack or heating pad.  Leave the heat on the area for 20-30 minutes.  If told, apply ice to the joint:  Put ice in a plastic bag.  Place a towel between your skin and the bag.  Leave the ice on for 20 minutes, 2-3 times per day.  Keep all follow-up visits as told by your doctor. Contact a doctor if:  The pain gets worse.  You have a fever. Get help right away if:  You have very bad pain in your joint.  You have swelling in your joint.  Your joint is red.  Many joints become painful and swollen.  You have very bad back pain.  Your leg is very weak.  You cannot control your pee (urine) or poop (stool). This information is not intended to replace advice given to you by your health care provider. Make sure you discuss any questions you have with your health care  provider. Document Released: 02/23/2010 Document Revised: 05/06/2016 Document Reviewed: 02/24/2015 Elsevier Interactive Patient Education  2017 Elsevier Inc.  

## 2017-03-09 NOTE — Progress Notes (Signed)
BP 131/75   Pulse 60   Temp (!) 96.9 F (36.1 C) (Oral)   Ht 6' (1.829 m)   Wt 218 lb 12.8 oz (99.2 kg)   BMI 29.67 kg/m    Subjective:    Patient ID: Bradley Fisher, male    DOB: July 21, 1966, 51 y.o.   MRN: [id number]  HPI: Bradley Fisher is a 51 y.o. male presenting on 03/09/2017 for Sore Throat (for atleast a week, no OTC's, no known fever) and Labs Only  This patient comes in for periodic recheck on medications and conditions including DDD, arthritis, seizure disorder, htn, hyperlipidemia.  The patient also has allergic rhinitis. He mistakenly had not been taking his Claritin. I feel that this is the most likely cause for his postnasal drip and sore throat. All other medications are reviewed today and refilled as needed.   All medications are reviewed today. There are no reports of any problems with the medications. All of the medical conditions are reviewed and updated.  Lab work is reviewed and will be ordered as medically necessary. There are no new problems reported with today's visit.   Relevant past medical, surgical, family and social history reviewed and updated as indicated. Allergies and medications reviewed and updated.  Past Medical History:  Diagnosis Date  . Arthritis   . Asthma   . Depression   . GERD (gastroesophageal reflux disease)   . Hyperlipidemia   . Hypertension   . Seizures ([place])    unknow etiology; on meds for this; no seizures since on meds 10 years ago    Past Surgical History:  Procedure Laterality Date  . KNEE ARTHROSCOPY WITH MEDIAL MENISECTOMY Left 11/11/2016   Procedure: LEFT KNEE ARTHROSCOPY WITH MEDIAL MENISECTOMY;  Surgeon: Carole Civil, MD;  Location: [facility];  Service: Orthopedics;  Laterality: Left;    Review of Systems  Constitutional: Negative.  Negative for appetite change and fatigue.  HENT: Positive for postnasal drip, sneezing and sore throat.   Eyes: Negative.  Negative for pain and visual disturbance.    Respiratory: Negative.  Negative for cough, chest tightness, shortness of breath and wheezing.   Cardiovascular: Negative.  Negative for chest pain, palpitations and leg swelling.  Gastrointestinal: Negative.  Negative for abdominal pain, diarrhea, nausea and vomiting.  Endocrine: Negative.   Genitourinary: Negative.   Musculoskeletal: Positive for arthralgias and back pain.  Skin: Negative.  Negative for color change and rash.  Neurological: Negative.  Negative for weakness, numbness and headaches.  Psychiatric/Behavioral: Negative.     Allergies as of 03/09/2017   No Known Allergies     Medication List       Accurate as of 03/09/17  1:31 PM. Always use your most recent med list.          atorvastatin 10 MG tablet Commonly known as:  LIPITOR Take 1 Tablet by mouth once daily   HYDROcodone-acetaminophen 5-325 MG tablet Commonly known as:  NORCO/VICODIN Take 1 tablet by mouth every 8 (eight) hours as needed for moderate pain.   lamoTRIgine 150 MG tablet Commonly known as:  LAMICTAL Take 1 tablet (150 mg total) by mouth 2 (two) times daily.   lisinopril 10 MG tablet Commonly known as:  PRINIVIL,ZESTRIL Take 1 tablet (10 mg total) by mouth daily.   loratadine 10 MG tablet Commonly known as:  CLARITIN Take 1 tablet (10 mg total) by mouth daily.   meloxicam 15 MG tablet Commonly known as:  MOBIC Take 1 tablet (15 mg  total) by mouth daily.   omeprazole 20 MG capsule Commonly known as:  PRILOSEC Take 1 Capsule by mouth 2 times a day BEFORE meals   PROAIR HFA 108 (90 Base) MCG/ACT inhaler Generic drug:  albuterol Inhale 2 puffs into the lungs every 6 (six) hours as needed for wheezing or shortness of breath.   sertraline 100 MG tablet Commonly known as:  ZOLOFT Take 1 tablet (100 mg total) by mouth daily.          Objective:    BP 131/75   Pulse 60   Temp (!) 96.9 F (36.1 C) (Oral)   Ht 6' (1.829 m)   Wt 218 lb 12.8 oz (99.2 kg)   BMI 29.67 kg/m   No  Known Allergies  Physical Exam  Constitutional: He appears well-developed and well-nourished.  HENT:  Head: Normocephalic and atraumatic.  Eyes: Conjunctivae and EOM are normal. Pupils are equal, round, and reactive to light.  Neck: Normal range of motion. Neck supple.  Cardiovascular: Normal rate, regular rhythm and normal heart sounds.   Pulmonary/Chest: Effort normal and breath sounds normal.  Abdominal: Soft. Bowel sounds are normal.  Musculoskeletal: Normal range of motion.  Skin: Skin is warm and dry.        Assessment & Plan:   1. Well adult exam - CMP14+EGFR - Lipid panel - TSH - PSA  2. Chronic seasonal allergic rhinitis due to pollen - loratadine (CLARITIN) 10 MG tablet; Take 1 tablet (10 mg total) by mouth daily.  Dispense: 90 tablet; Refill: 3  3. Depression, recurrent ([place]) - sertraline (ZOLOFT) 100 MG tablet; Take 1 tablet (100 mg total) by mouth daily.  Dispense: 90 tablet; Refill: 3  4. Generalized OA - meloxicam (MOBIC) 15 MG tablet; Take 1 tablet (15 mg total) by mouth daily.  Dispense: 90 tablet; Refill: 3  5. Chronic midline low back pain without sciatica - meloxicam (MOBIC) 15 MG tablet; Take 1 tablet (15 mg total) by mouth daily.  Dispense: 90 tablet; Refill: 3  6. Seizure disorder ([facility]) - lamoTRIgine (LAMICTAL) 150 MG tablet; Take 1 tablet (150 mg total) by mouth 2 (two) times daily.  Dispense: 180 tablet; Refill: 3  7. S/P arthroscopy of left knee - HYDROcodone-acetaminophen (NORCO/VICODIN) 5-325 MG tablet; Take 1 tablet by mouth every 8 (eight) hours as needed for moderate pain.  Dispense: 90 tablet; Refill: 0   Continue all other maintenance medications as listed above.  Follow up plan: Return in about 3 months (around 06/09/2017) for recheck.  Educational handout given for arthritis  Terald Sleeper PA-C [facility] [address]  [place], [place] [address] [phone]   03/09/2017, 1:31 PM

## 2017-03-10 LAB — LIPID PANEL
CHOLESTEROL TOTAL: 141 mg/dL (ref 100–199)
Chol/HDL Ratio: 3.2 ratio units (ref 0.0–5.0)
HDL: 44 mg/dL (ref >39)
LDL CALC: 58 mg/dL (ref 0–99)
Triglycerides: 196 mg/dL — ABNORMAL HIGH (ref 0–149)
VLDL CHOLESTEROL CAL: 39 mg/dL (ref 5–40)

## 2017-03-10 LAB — CMP14+EGFR
ALBUMIN: 4.9 g/dL (ref 3.5–5.5)
ALT: 23 IU/L (ref 0–44)
AST: 30 IU/L (ref 0–40)
Albumin/Globulin Ratio: 1.9 (ref 1.2–2.2)
Alkaline Phosphatase: 116 IU/L (ref 39–117)
BUN / CREAT RATIO: 14 (ref 9–20)
BUN: 13 mg/dL (ref 6–24)
Bilirubin Total: 0.3 mg/dL (ref 0.0–1.2)
CO2: 26 mmol/L (ref 18–29)
CREATININE: 0.92 mg/dL (ref 0.76–1.27)
Calcium: 9.7 mg/dL (ref 8.7–10.2)
Chloride: 97 mmol/L (ref 96–106)
GFR calc non Af Amer: 96 mL/min/{1.73_m2} (ref >59)
GFR, EST AFRICAN AMERICAN: 111 mL/min/{1.73_m2} (ref >59)
GLUCOSE: 94 mg/dL (ref 65–99)
Globulin, Total: 2.6 g/dL (ref 1.5–4.5)
Potassium: 4.9 mmol/L (ref 3.5–5.2)
Sodium: 139 mmol/L (ref 134–144)
TOTAL PROTEIN: 7.5 g/dL (ref 6.0–8.5)

## 2017-03-10 LAB — TSH: TSH: 1.51 u[IU]/mL (ref 0.450–4.500)

## 2017-03-10 LAB — PSA: Prostate Specific Ag, Serum: 0.2 ng/mL (ref 0.0–4.0)

## 2017-03-15 ENCOUNTER — Ambulatory Visit: Payer: Medicare Other | Admitting: Family Medicine

## 2017-03-16 ENCOUNTER — Encounter: Payer: Self-pay | Admitting: Physician Assistant

## 2017-04-20 DIAGNOSIS — Z79899 Other long term (current) drug therapy: Secondary | ICD-10-CM | POA: Diagnosis not present

## 2017-04-20 DIAGNOSIS — E785 Hyperlipidemia, unspecified: Secondary | ICD-10-CM | POA: Diagnosis not present

## 2017-04-20 DIAGNOSIS — M549 Dorsalgia, unspecified: Secondary | ICD-10-CM | POA: Diagnosis not present

## 2017-07-04 IMAGING — MR MR KNEE*L* W/O CM
4 of 6 series · 13 of 40 positions shown · non-contrast
Comparison: 08/19/2016

CLINICAL DATA: Patient complains of pain with some swelling for the
past six months following a fall into a hole in his yard.

EXAM:
MRI OF THE LEFT KNEE WITHOUT CONTRAST
TECHNIQUE: Multiplanar, multisequence MR imaging of the knee was performed. No
intravenous contrast was administered.

[Series 3: pdfs axial · axial · 3.0mm · 0.20mm/px · z∈[-0,+71]mm · 3 of 32 slices shown]
[im 6/32]
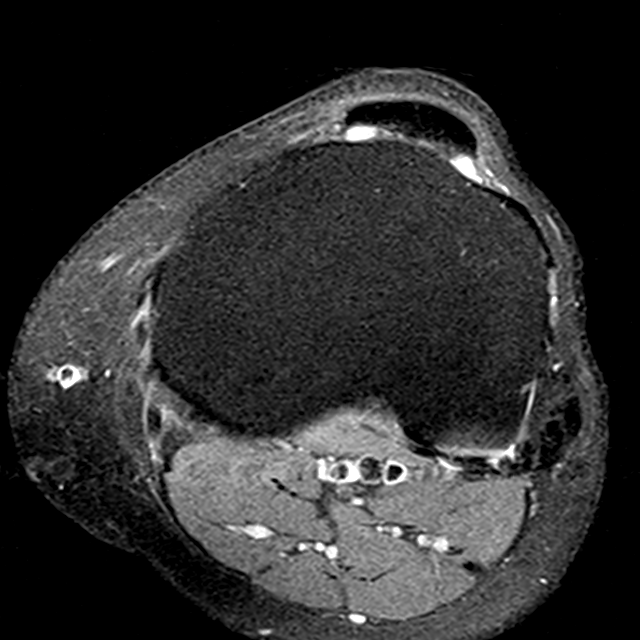
[im 16/32]
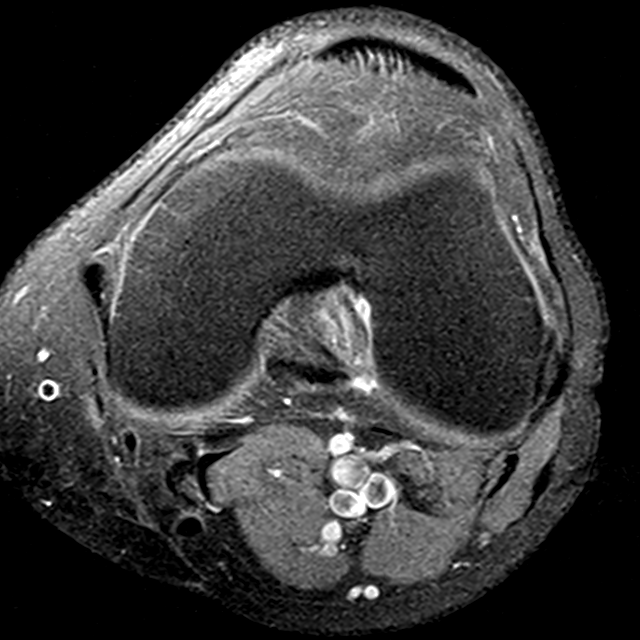
[im 26/32]
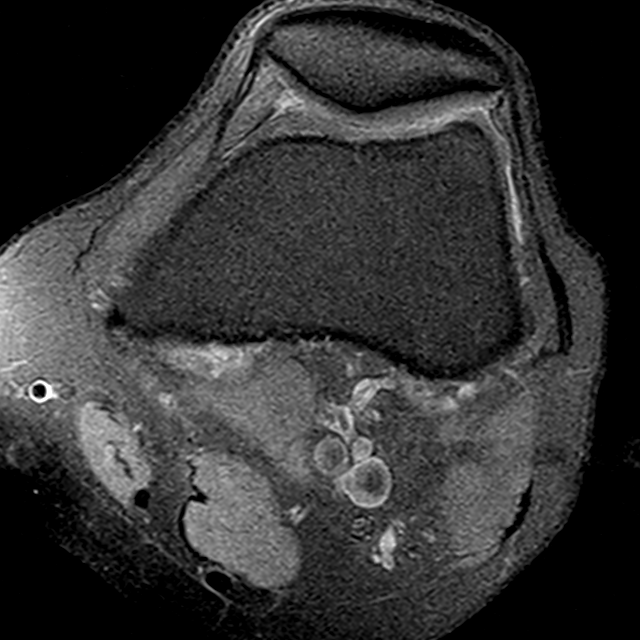

[Series 4: T1 · coronal · 3.0mm · 0.18mm/px · 4 of 28 slices shown]
[im 1/28]
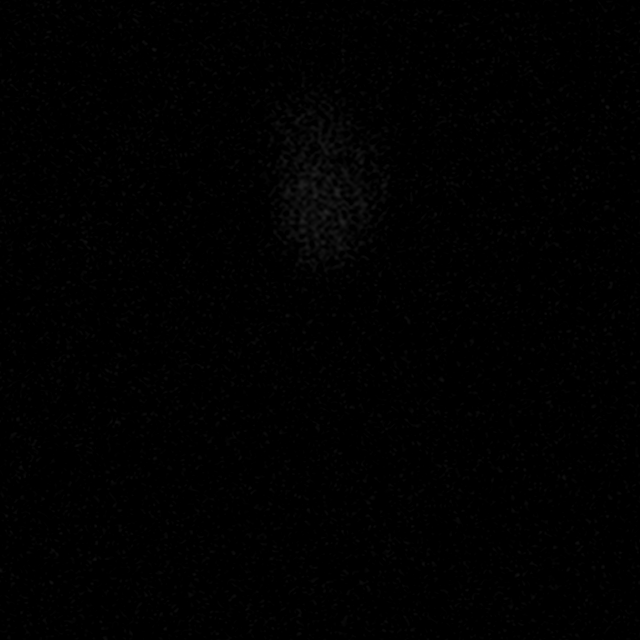
[im 5/28]
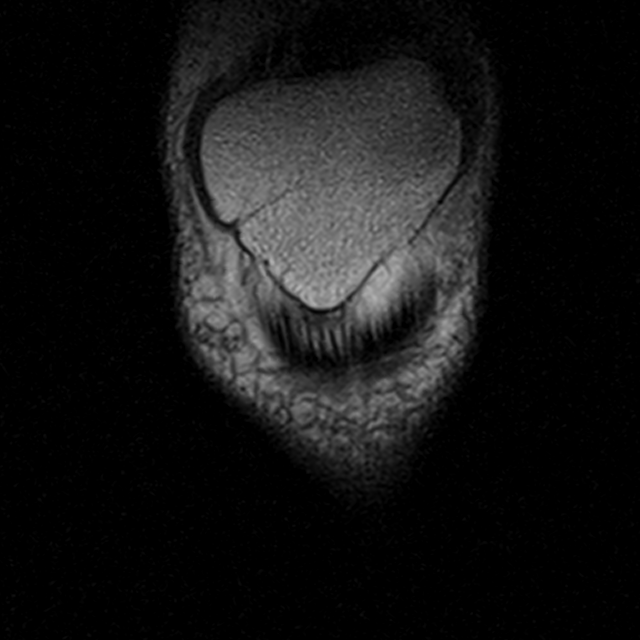
[im 14/28]
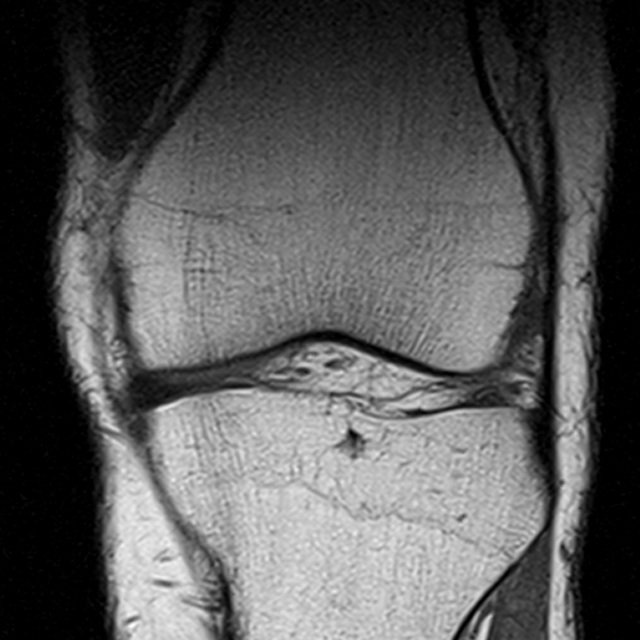
[im 23/28]
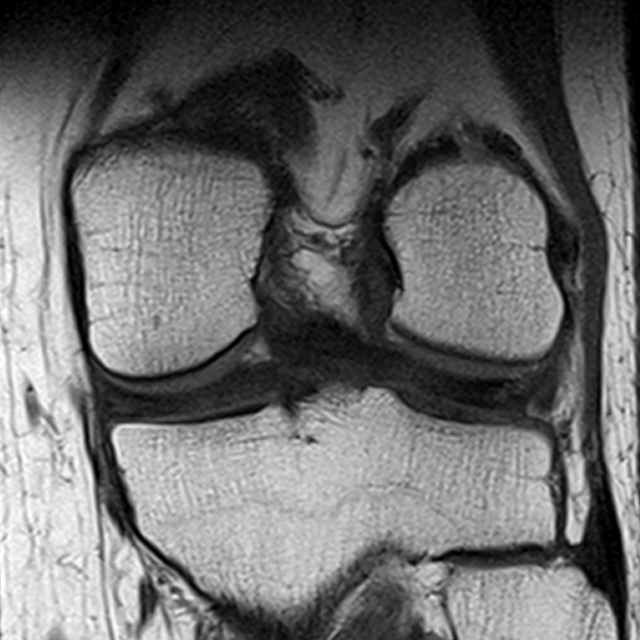

[Series 5: pdfs sag · sagittal · 3.0mm · 0.19mm/px · 3 of 32 slices shown]
[im 5/32]
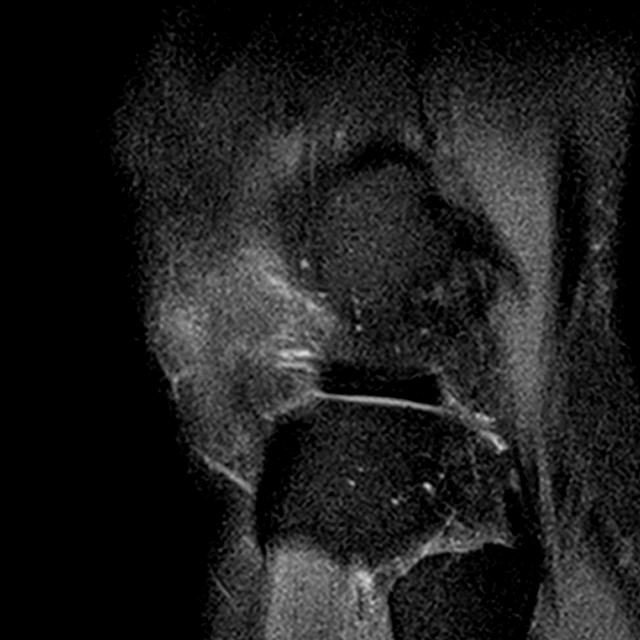
[im 18/32]
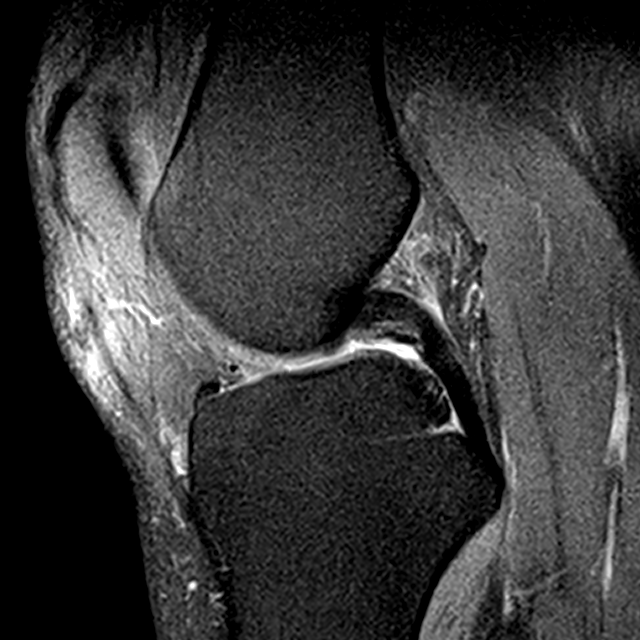
[im 27/32]
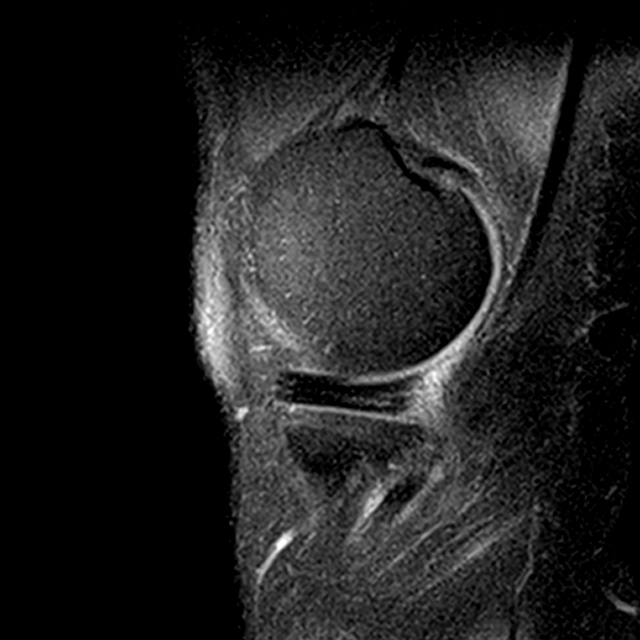

[Series 6: t2fs cor · coronal · 3.0mm · 0.19mm/px · 3 of 28 slices shown]
[im 5/28]
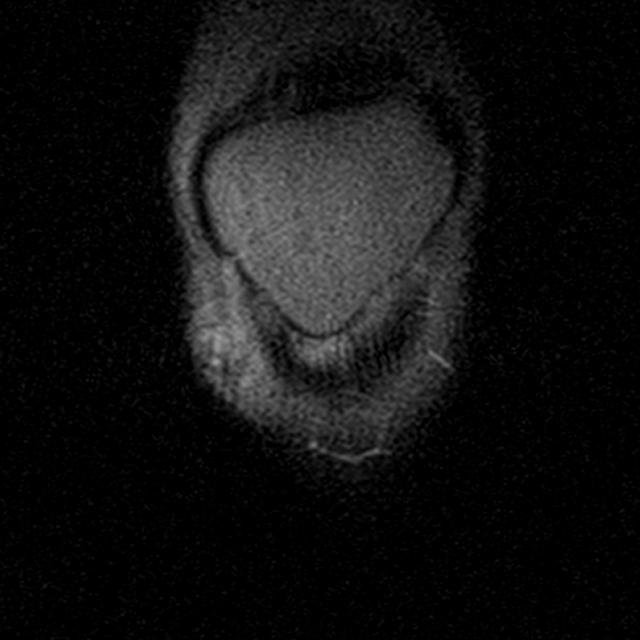
[im 14/28]
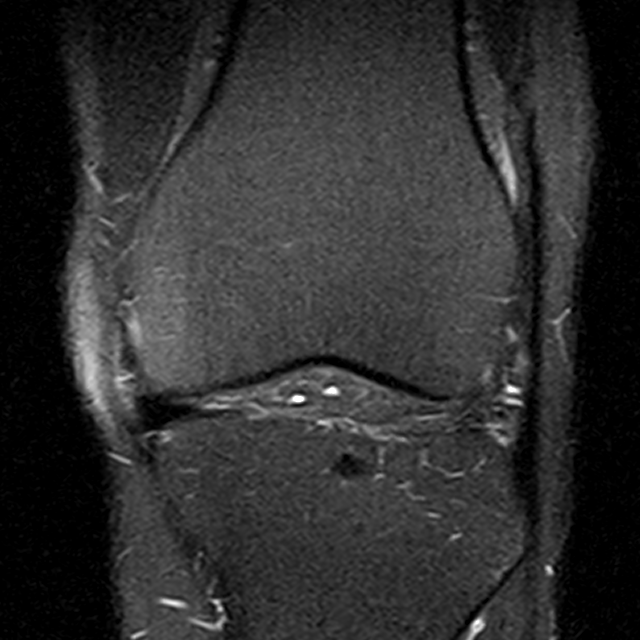
[im 23/28]
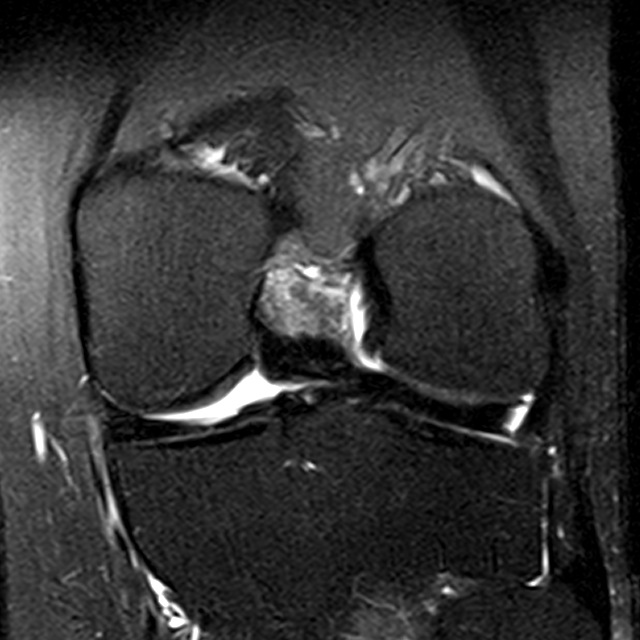

[13 of 40 positions shown; findings below may reference images not displayed]

FINDINGS: MENISCI

Medial meniscus: Small free edge tear of the posterior horn medial
meniscus.

Lateral meniscus:  Unremarkable.  Incomplete discoid variant.

LIGAMENTS

Cruciates:  Unremarkable

Collaterals:  Unremarkable

CARTILAGE

Patellofemoral:  Unremarkable

Medial:  Unremarkable

Lateral:  Mild degenerative chondral thinning.

Joint:  Unremarkable

Popliteal Fossa:  Unremarkable

Extensor Mechanism:  Unremarkable

Bones:  Unremarkable

Other: No supplemental non-categorized findings.
IMPRESSION: 1. Small free edge tear of the posterior horn medial meniscus.
2. Mild degenerative chondral thinning in the lateral compartment.

## 2017-07-07 ENCOUNTER — Other Ambulatory Visit: Payer: Self-pay | Admitting: Physician Assistant

## 2017-08-03 DIAGNOSIS — G4700 Insomnia, unspecified: Secondary | ICD-10-CM | POA: Diagnosis not present

## 2017-08-03 DIAGNOSIS — M549 Dorsalgia, unspecified: Secondary | ICD-10-CM | POA: Diagnosis not present

## 2017-08-03 DIAGNOSIS — G40909 Epilepsy, unspecified, not intractable, without status epilepticus: Secondary | ICD-10-CM | POA: Diagnosis not present

## 2017-08-03 DIAGNOSIS — I10 Essential (primary) hypertension: Secondary | ICD-10-CM | POA: Diagnosis not present

## 2017-08-03 DIAGNOSIS — E785 Hyperlipidemia, unspecified: Secondary | ICD-10-CM | POA: Diagnosis not present

## 2017-08-03 DIAGNOSIS — Z79899 Other long term (current) drug therapy: Secondary | ICD-10-CM | POA: Diagnosis not present

## 2017-08-08 ENCOUNTER — Other Ambulatory Visit: Payer: Self-pay | Admitting: Physician Assistant

## 2017-08-08 DIAGNOSIS — I10 Essential (primary) hypertension: Secondary | ICD-10-CM

## 2017-08-17 ENCOUNTER — Encounter: Payer: Self-pay | Admitting: Physician Assistant

## 2017-08-17 ENCOUNTER — Ambulatory Visit (INDEPENDENT_AMBULATORY_CARE_PROVIDER_SITE_OTHER): Payer: Medicare Other | Admitting: Physician Assistant

## 2017-08-17 VITALS — BP 130/82 | HR 68 | Temp 97.1°F | Ht 72.0 in | Wt 222.0 lb

## 2017-08-17 DIAGNOSIS — I10 Essential (primary) hypertension: Secondary | ICD-10-CM | POA: Diagnosis not present

## 2017-08-17 DIAGNOSIS — G40909 Epilepsy, unspecified, not intractable, without status epilepticus: Secondary | ICD-10-CM

## 2017-08-17 DIAGNOSIS — F339 Major depressive disorder, recurrent, unspecified: Secondary | ICD-10-CM | POA: Diagnosis not present

## 2017-08-17 DIAGNOSIS — R358 Other polyuria: Secondary | ICD-10-CM

## 2017-08-17 DIAGNOSIS — K219 Gastro-esophageal reflux disease without esophagitis: Secondary | ICD-10-CM

## 2017-08-17 DIAGNOSIS — J301 Allergic rhinitis due to pollen: Secondary | ICD-10-CM

## 2017-08-17 DIAGNOSIS — Z9889 Other specified postprocedural states: Secondary | ICD-10-CM | POA: Diagnosis not present

## 2017-08-17 DIAGNOSIS — R5383 Other fatigue: Secondary | ICD-10-CM

## 2017-08-17 DIAGNOSIS — R3589 Other polyuria: Secondary | ICD-10-CM

## 2017-08-17 DIAGNOSIS — R631 Polydipsia: Secondary | ICD-10-CM

## 2017-08-17 MED ORDER — HYDROCODONE-ACETAMINOPHEN 5-325 MG PO TABS: 1.0000 | ORAL_TABLET | Freq: Three times a day (TID) | ORAL | 0 refills | Status: DC | PRN

## 2017-08-17 MED ORDER — ATORVASTATIN CALCIUM 10 MG PO TABS: 10.0000 mg | ORAL_TABLET | Freq: Every day | ORAL | 3 refills | Status: DC

## 2017-08-17 MED ORDER — OMEPRAZOLE 20 MG PO CPDR: DELAYED_RELEASE_CAPSULE | ORAL | 3 refills | Status: DC

## 2017-08-17 MED ORDER — LORATADINE 10 MG PO TABS: 10.0000 mg | ORAL_TABLET | Freq: Every day | ORAL | 3 refills | Status: DC

## 2017-08-17 MED ORDER — LISINOPRIL 10 MG PO TABS: 10.0000 mg | ORAL_TABLET | Freq: Every day | ORAL | 3 refills | Status: DC

## 2017-08-17 NOTE — Progress Notes (Signed)
BP 130/82   Pulse 68   Temp (!) 97.1 F (36.2 C) (Oral)   Ht 6' (1.829 m)   Wt 222 lb (100.7 kg)   BMI 30.11 kg/m    Subjective:    Patient ID: Bradley Fisher, male    DOB: 1966/06/24, 51 y.o.   MRN: [id number]  HPI: EGBERT Fisher is a 51 y.o. male presenting on 08/17/2017 for Follow-up (6 month)  This patient comes in for periodic recheck on medications and conditions including Hypertension, epilepsy, GERD, depression. He reports he is having a significant amount of fatigue, thirst, and nocturia 5-6 times. He does have periods that had diabetes later in her life. He has never had his glucose checked other than are regular labs. His March comprehensive metabolic showed a normal glucose.   All medications are reviewed today. There are no reports of any problems with the medications. All of the medical conditions are reviewed and updated.  Lab work is reviewed and will be ordered as medically necessary. There are no new problems reported with today's visit.    Relevant past medical, surgical, family and social history reviewed and updated as indicated. Allergies and medications reviewed and updated.  Past Medical History:  Diagnosis Date  . Arthritis   . Asthma   . Depression   . GERD (gastroesophageal reflux disease)   . Hyperlipidemia   . Hypertension   . Seizures ([place])    unknow etiology; on meds for this; no seizures since on meds 10 years ago    Past Surgical History:  Procedure Laterality Date  . KNEE ARTHROSCOPY WITH MEDIAL MENISECTOMY Left 11/11/2016   Procedure: LEFT KNEE ARTHROSCOPY WITH MEDIAL MENISECTOMY;  Surgeon: Carole Civil, MD;  Location: [facility];  Service: Orthopedics;  Laterality: Left;    Review of Systems  Constitutional: Positive for fatigue. Negative for appetite change.  HENT: Negative.   Eyes: Negative.  Negative for pain and visual disturbance.  Respiratory: Negative.  Negative for cough, chest tightness, shortness of breath and  wheezing.   Cardiovascular: Negative.  Negative for chest pain, palpitations and leg swelling.  Gastrointestinal: Negative.  Negative for abdominal pain, diarrhea, nausea and vomiting.  Endocrine: Positive for polydipsia, polyphagia and polyuria.  Genitourinary: Negative.   Musculoskeletal: Negative.   Skin: Negative.  Negative for color change and rash.  Neurological: Negative.  Negative for weakness, numbness and headaches.  Psychiatric/Behavioral: Negative.     Allergies as of 08/17/2017   No Known Allergies     Medication List       Accurate as of 08/17/17 11:50 AM. Always use your most recent med list.          atorvastatin 10 MG tablet Commonly known as:  LIPITOR Take 1 tablet (10 mg total) by mouth daily.   HYDROcodone-acetaminophen 5-325 MG tablet Commonly known as:  NORCO/VICODIN Take 1 tablet by mouth every 8 (eight) hours as needed for moderate pain.   lisinopril 10 MG tablet Commonly known as:  PRINIVIL,ZESTRIL Take 1 tablet (10 mg total) by mouth daily.   loratadine 10 MG tablet Commonly known as:  CLARITIN Take 1 tablet (10 mg total) by mouth daily.   meloxicam 15 MG tablet Commonly known as:  MOBIC Take 1 tablet (15 mg total) by mouth daily.   omeprazole 20 MG capsule Commonly known as:  PRILOSEC Take 1 Capsule by mouth 2 times a day BEFORE meals   PROAIR HFA 108 (90 Base) MCG/ACT inhaler Generic drug:  albuterol Inhale  2 puffs into the lungs every 6 (six) hours as needed for wheezing or shortness of breath.   sertraline 100 MG tablet Commonly known as:  ZOLOFT Take 1 tablet (100 mg total) by mouth daily.            Discharge Care Instructions        Start     Ordered   08/17/17 0000  CMP14+EGFR     08/17/17 1022   08/17/17 0000  CBC with Differential/Platelet     08/17/17 1022   08/17/17 0000  TSH     08/17/17 1022   08/17/17 0000  omeprazole (PRILOSEC) 20 MG capsule    Question:  Supervising Provider  Answer:  Timmothy Euler    08/17/17 1025   08/17/17 0000  lisinopril (PRINIVIL,ZESTRIL) 10 MG tablet  Daily    Question:  Supervising Provider  Answer:  Timmothy Euler   08/17/17 1025   08/17/17 0000  atorvastatin (LIPITOR) 10 MG tablet  Daily    Question:  Supervising Provider  Answer:  Timmothy Euler   08/17/17 1025   08/17/17 0000  loratadine (CLARITIN) 10 MG tablet  Daily    Question:  Supervising Provider  Answer:  Kenn File L   08/17/17 1025   08/17/17 0000  HYDROcodone-acetaminophen (NORCO/VICODIN) 5-325 MG tablet  Every 8 hours PRN    Question:  Supervising Provider  Answer:  Timmothy Euler   08/17/17 1025         Objective:    BP 130/82   Pulse 68   Temp (!) 97.1 F (36.2 C) (Oral)   Ht 6' (1.829 m)   Wt 222 lb (100.7 kg)   BMI 30.11 kg/m   No Known Allergies  Physical Exam  Constitutional: He appears well-developed and well-nourished.  HENT:  Head: Normocephalic and atraumatic.  Eyes: Pupils are equal, round, and reactive to light. Conjunctivae and EOM are normal.  Neck: Normal range of motion. Neck supple.  Cardiovascular: Normal rate, regular rhythm and normal heart sounds.   Pulmonary/Chest: Effort normal and breath sounds normal.  Abdominal: Soft. Bowel sounds are normal.  Musculoskeletal: Normal range of motion.  Skin: Skin is warm and dry.  Nursing note and vitals reviewed.   Results for orders placed or performed in visit on 03/09/17  CMP14+EGFR  Result Value Ref Range   Glucose 94 65 - 99 mg/dL   BUN 13 6 - 24 mg/dL   Creatinine, Ser 0.92 0.76 - 1.27 mg/dL   GFR calc non Af Amer 96 >59 mL/min/1.73   GFR calc Af Amer 111 >59 mL/min/1.73   BUN/Creatinine Ratio 14 9 - 20   Sodium 139 134 - 144 mmol/L   Potassium 4.9 3.5 - 5.2 mmol/L   Chloride 97 96 - 106 mmol/L   CO2 26 18 - 29 mmol/L   Calcium 9.7 8.7 - 10.2 mg/dL   Total Protein 7.5 6.0 - 8.5 g/dL   Albumin 4.9 3.5 - 5.5 g/dL   Globulin, Total 2.6 1.5 - 4.5 g/dL   Albumin/Globulin Ratio 1.9 1.2 -  2.2   Bilirubin Total 0.3 0.0 - 1.2 mg/dL   Alkaline Phosphatase 116 39 - 117 IU/L   AST 30 0 - 40 IU/L   ALT 23 0 - 44 IU/L  Lipid panel  Result Value Ref Range   Cholesterol, Total 141 100 - 199 mg/dL   Triglycerides 196 (H) 0 - 149 mg/dL   HDL 44 >39 mg/dL   VLDL Cholesterol  Cal 39 5 - 40 mg/dL   LDL Calculated 58 0 - 99 mg/dL   Chol/HDL Ratio 3.2 0.0 - 5.0 ratio units  TSH  Result Value Ref Range   TSH 1.510 0.450 - 4.500 uIU/mL  PSA  Result Value Ref Range   Prostate Specific Ag, Serum 0.2 0.0 - 4.0 ng/mL      Assessment & Plan:   1. Hypertension, essential - CMP14+EGFR - CBC with Differential/Platelet - TSH - lisinopril (PRINIVIL,ZESTRIL) 10 MG tablet; Take 1 tablet (10 mg total) by mouth daily.  Dispense: 90 tablet; Refill: 3  2. Depression, recurrent ([place])  3. Fatigue, unspecified type - CMP14+EGFR - CBC with Differential/Platelet - TSH  4. Polydipsia - CMP14+EGFR  5. Polyuria - CMP14+EGFR  6. Chronic seasonal allergic rhinitis due to pollen - loratadine (CLARITIN) 10 MG tablet; Take 1 tablet (10 mg total) by mouth daily.  Dispense: 90 tablet; Refill: 3  7. S/P arthroscopy of left knee - HYDROcodone-acetaminophen (NORCO/VICODIN) 5-325 MG tablet; Take 1 tablet by mouth every 8 (eight) hours as needed for moderate pain.  Dispense: 90 tablet; Refill: 0  8. Nonintractable epilepsy without status epilepticus, unspecified epilepsy type ([place])  9. Gastroesophageal reflux disease without esophagitis    Current Outpatient Prescriptions:  .  atorvastatin (LIPITOR) 10 MG tablet, Take 1 tablet (10 mg total) by mouth daily., Disp: 90 tablet, Rfl: 3 .  HYDROcodone-acetaminophen (NORCO/VICODIN) 5-325 MG tablet, Take 1 tablet by mouth every 8 (eight) hours as needed for moderate pain., Disp: 90 tablet, Rfl: 0 .  lisinopril (PRINIVIL,ZESTRIL) 10 MG tablet, Take 1 tablet (10 mg total) by mouth daily., Disp: 90 tablet, Rfl: 3 .  loratadine (CLARITIN) 10 MG tablet, Take 1  tablet (10 mg total) by mouth daily., Disp: 90 tablet, Rfl: 3 .  meloxicam (MOBIC) 15 MG tablet, Take 1 tablet (15 mg total) by mouth daily., Disp: 90 tablet, Rfl: 3 .  omeprazole (PRILOSEC) 20 MG capsule, Take 1 Capsule by mouth 2 times a day BEFORE meals, Disp: 180 capsule, Rfl: 3 .  PROAIR HFA 108 (90 Base) MCG/ACT inhaler, Inhale 2 puffs into the lungs every 6 (six) hours as needed for wheezing or shortness of breath. , Disp: , Rfl:  .  sertraline (ZOLOFT) 100 MG tablet, Take 1 tablet (100 mg total) by mouth daily., Disp: 90 tablet, Rfl: 3 Continue all other maintenance medications as listed above.  Follow up plan: Return in about 6 months (around 02/14/2018) for recheck.  Educational handout given for [place] PA-C [facility] [address]  [place], [place] [address] [phone]   08/17/2017, 11:50 AM

## 2017-08-17 NOTE — Patient Instructions (Signed)
In a few days you may receive a survey in the mail or online from [employer] regarding your visit with us today. Please take a moment to fill this out. Your feedback is very important to our whole office. It can help us better understand your needs as well as improve your experience and satisfaction. Thank you for taking your time to complete it. We care about you.  Yanette Tripoli, PA-C  

## 2017-08-18 LAB — CMP14+EGFR
A/G RATIO: 1.8 (ref 1.2–2.2)
ALBUMIN: 4.6 g/dL (ref 3.5–5.5)
ALT: 19 IU/L (ref 0–44)
AST: 32 IU/L (ref 0–40)
Alkaline Phosphatase: 96 IU/L (ref 39–117)
BILIRUBIN TOTAL: 0.4 mg/dL (ref 0.0–1.2)
BUN / CREAT RATIO: 20 (ref 9–20)
BUN: 17 mg/dL (ref 6–24)
CALCIUM: 9.7 mg/dL (ref 8.7–10.2)
CHLORIDE: 101 mmol/L (ref 96–106)
CO2: 23 mmol/L (ref 20–29)
Creatinine, Ser: 0.85 mg/dL (ref 0.76–1.27)
GFR, EST AFRICAN AMERICAN: 117 mL/min/{1.73_m2} (ref >59)
GFR, EST NON AFRICAN AMERICAN: 101 mL/min/{1.73_m2} (ref >59)
GLOBULIN, TOTAL: 2.6 g/dL (ref 1.5–4.5)
Glucose: 105 mg/dL — ABNORMAL HIGH (ref 65–99)
POTASSIUM: 4.1 mmol/L (ref 3.5–5.2)
SODIUM: 138 mmol/L (ref 134–144)
TOTAL PROTEIN: 7.2 g/dL (ref 6.0–8.5)

## 2017-08-18 LAB — CBC WITH DIFFERENTIAL/PLATELET
BASOS ABS: 0.1 10*3/uL (ref 0.0–0.2)
BASOS: 1 %
EOS (ABSOLUTE): 0.3 10*3/uL (ref 0.0–0.4)
Eos: 4 %
HEMOGLOBIN: 13.6 g/dL (ref 13.0–17.7)
Hematocrit: 39.6 % (ref 37.5–51.0)
Immature Grans (Abs): 0 10*3/uL (ref 0.0–0.1)
Immature Granulocytes: 0 %
LYMPHS ABS: 1.9 10*3/uL (ref 0.7–3.1)
Lymphs: 27 %
MCH: 30.8 pg (ref 26.6–33.0)
MCHC: 34.3 g/dL (ref 31.5–35.7)
MCV: 90 fL (ref 79–97)
MONOS ABS: 0.5 10*3/uL (ref 0.1–0.9)
Monocytes: 7 %
NEUTROS ABS: 4.3 10*3/uL (ref 1.4–7.0)
Neutrophils: 61 %
Platelets: 204 10*3/uL (ref 150–379)
RBC: 4.42 x10E6/uL (ref 4.14–5.80)
RDW: 13.9 % (ref 12.3–15.4)
WBC: 7.1 10*3/uL (ref 3.4–10.8)

## 2017-08-18 LAB — TSH: TSH: 0.893 u[IU]/mL (ref 0.450–4.500)

## 2017-09-21 ENCOUNTER — Ambulatory Visit (INDEPENDENT_AMBULATORY_CARE_PROVIDER_SITE_OTHER): Payer: Medicare Other

## 2017-09-21 DIAGNOSIS — Z23 Encounter for immunization: Secondary | ICD-10-CM | POA: Diagnosis not present

## 2017-09-26 ENCOUNTER — Other Ambulatory Visit: Payer: Self-pay | Admitting: Physician Assistant

## 2017-09-26 DIAGNOSIS — Z9889 Other specified postprocedural states: Secondary | ICD-10-CM

## 2017-09-26 MED ORDER — ATORVASTATIN CALCIUM 10 MG PO TABS: 10.0000 mg | ORAL_TABLET | Freq: Every day | ORAL | 1 refills | Status: DC

## 2017-09-26 NOTE — Telephone Encounter (Signed)
Pt aware refill Atorvastatin sent to pharmacy  He has that medication, he is wanting refill on his Hydrocodone.

## 2017-09-26 NOTE — Telephone Encounter (Signed)
What is the name of the medication? ATORVASTATIN  Have you contacted your pharmacy to request a refill? yes  Which pharmacy would you like this sent to? [facility], he is out   Patient notified that their request is being sent to the clinical staff for review and that they should receive a call once it is complete. If they do not receive a call within 24 hours they can check with their pharmacy or our office.

## 2017-09-27 ENCOUNTER — Ambulatory Visit (INDEPENDENT_AMBULATORY_CARE_PROVIDER_SITE_OTHER): Payer: Medicare Other | Admitting: Physician Assistant

## 2017-09-27 ENCOUNTER — Encounter: Payer: Self-pay | Admitting: Physician Assistant

## 2017-09-27 VITALS — BP 136/82 | HR 53 | Temp 97.3°F | Ht 72.0 in | Wt 229.4 lb

## 2017-09-27 DIAGNOSIS — M545 Low back pain: Secondary | ICD-10-CM

## 2017-09-27 DIAGNOSIS — Z9889 Other specified postprocedural states: Secondary | ICD-10-CM

## 2017-09-27 DIAGNOSIS — M1990 Unspecified osteoarthritis, unspecified site: Secondary | ICD-10-CM

## 2017-09-27 MED ORDER — HYDROCODONE-ACETAMINOPHEN 5-325 MG PO TABS: 1.0000 | ORAL_TABLET | Freq: Three times a day (TID) | ORAL | 0 refills | Status: DC | PRN

## 2017-09-27 NOTE — Patient Instructions (Signed)
In a few days you may receive a survey in the mail or online from [employer] regarding your visit with us today. Please take a moment to fill this out. Your feedback is very important to our whole office. It can help us better understand your needs as well as improve your experience and satisfaction. Thank you for taking your time to complete it. We care about you.  Lore Polka, PA-C  

## 2017-09-27 NOTE — Progress Notes (Signed)
BP 136/82   Pulse (!) 53   Temp (!) 97.3 F (36.3 C) (Oral)   Ht 6' (1.829 m)   Wt 229 lb 6.4 oz (104.1 kg)   BMI 31.11 kg/m    Subjective:    Patient ID: Bradley Fisher, male    DOB: 1966/03/20, 51 y.o.   MRN: [id number]  HPI: Bradley Fisher is a 51 y.o. male presenting on 09/27/2017 for Medication Refill  Chronic pain is under control, patient has no new complaints. Medications are keeping things stable. Needs refills for the next three months.  [place] Controlled Substance website checked and normal. Drug screen normal this year.   Relevant past medical, surgical, family and social history reviewed and updated as indicated. Allergies and medications reviewed and updated.  Past Medical History:  Diagnosis Date  . Arthritis   . Asthma   . Depression   . GERD (gastroesophageal reflux disease)   . Hyperlipidemia   . Hypertension   . Seizures ([place])    unknow etiology; on meds for this; no seizures since on meds 10 years ago    Past Surgical History:  Procedure Laterality Date  . KNEE ARTHROSCOPY WITH MEDIAL MENISECTOMY Left 11/11/2016   Procedure: LEFT KNEE ARTHROSCOPY WITH MEDIAL MENISECTOMY;  Surgeon: Carole Civil, MD;  Location: [facility];  Service: Orthopedics;  Laterality: Left;    Review of Systems  Constitutional: Negative.  Negative for appetite change and fatigue.  Eyes: Negative for pain and visual disturbance.  Respiratory: Negative.  Negative for cough, chest tightness, shortness of breath and wheezing.   Cardiovascular: Negative.  Negative for chest pain, palpitations and leg swelling.  Gastrointestinal: Negative.  Negative for abdominal pain, diarrhea, nausea and vomiting.  Genitourinary: Negative.   Musculoskeletal: Positive for arthralgias, gait problem and myalgias.  Skin: Negative.  Negative for color change and rash.  Neurological: Negative for weakness, numbness and headaches.  Psychiatric/Behavioral: Negative.     Allergies as of 09/27/2017    No Known Allergies     Medication List       Accurate as of 09/27/17  1:12 PM. Always use your most recent med list.          atorvastatin 10 MG tablet Commonly known as:  LIPITOR Take 1 tablet (10 mg total) by mouth daily.   HYDROcodone-acetaminophen 5-325 MG tablet Commonly known as:  NORCO/VICODIN Take 1 tablet by mouth every 8 (eight) hours as needed for moderate pain.   HYDROcodone-acetaminophen 5-325 MG tablet Commonly known as:  NORCO/VICODIN Take 1 tablet by mouth every 8 (eight) hours as needed for moderate pain.   HYDROcodone-acetaminophen 5-325 MG tablet Commonly known as:  NORCO/VICODIN Take 1 tablet by mouth every 8 (eight) hours as needed for moderate pain.   lisinopril 10 MG tablet Commonly known as:  PRINIVIL,ZESTRIL Take 1 tablet (10 mg total) by mouth daily.   loratadine 10 MG tablet Commonly known as:  CLARITIN Take 1 tablet (10 mg total) by mouth daily.   meloxicam 15 MG tablet Commonly known as:  MOBIC Take 1 tablet (15 mg total) by mouth daily.   omeprazole 20 MG capsule Commonly known as:  PRILOSEC Take 1 Capsule by mouth 2 times a day BEFORE meals   PROAIR HFA 108 (90 Base) MCG/ACT inhaler Generic drug:  albuterol Inhale 2 puffs into the lungs every 6 (six) hours as needed for wheezing or shortness of breath.   sertraline 100 MG tablet Commonly known as:  ZOLOFT Take 1 tablet (100  mg total) by mouth daily.          Objective:    BP 136/82   Pulse (!) 53   Temp (!) 97.3 F (36.3 C) (Oral)   Ht 6' (1.829 m)   Wt 229 lb 6.4 oz (104.1 kg)   BMI 31.11 kg/m   No Known Allergies  Physical Exam  Constitutional: He appears well-developed and well-nourished.  HENT:  Head: Normocephalic and atraumatic.  Eyes: Pupils are equal, round, and reactive to light. Conjunctivae and EOM are normal.  Neck: Normal range of motion. Neck supple.  Cardiovascular: Normal rate, regular rhythm and normal heart sounds.   Pulmonary/Chest: Effort  normal and breath sounds normal.  Abdominal: Soft. Bowel sounds are normal.  Musculoskeletal:       Right knee: He exhibits decreased range of motion and swelling.       Left knee: He exhibits decreased range of motion and swelling.       Lumbar back: He exhibits decreased range of motion and tenderness.       Back:  Skin: Skin is warm and dry.    Results for orders placed or performed in visit on 08/17/17  CMP14+EGFR  Result Value Ref Range   Glucose 105 (H) 65 - 99 mg/dL   BUN 17 6 - 24 mg/dL   Creatinine, Ser 0.85 0.76 - 1.27 mg/dL   GFR calc non Af Amer 101 >59 mL/min/1.73   GFR calc Af Amer 117 >59 mL/min/1.73   BUN/Creatinine Ratio 20 9 - 20   Sodium 138 134 - 144 mmol/L   Potassium 4.1 3.5 - 5.2 mmol/L   Chloride 101 96 - 106 mmol/L   CO2 23 20 - 29 mmol/L   Calcium 9.7 8.7 - 10.2 mg/dL   Total Protein 7.2 6.0 - 8.5 g/dL   Albumin 4.6 3.5 - 5.5 g/dL   Globulin, Total 2.6 1.5 - 4.5 g/dL   Albumin/Globulin Ratio 1.8 1.2 - 2.2   Bilirubin Total 0.4 0.0 - 1.2 mg/dL   Alkaline Phosphatase 96 39 - 117 IU/L   AST 32 0 - 40 IU/L   ALT 19 0 - 44 IU/L  CBC with Differential/Platelet  Result Value Ref Range   WBC 7.1 3.4 - 10.8 x10E3/uL   RBC 4.42 4.14 - 5.80 x10E6/uL   Hemoglobin 13.6 13.0 - 17.7 g/dL   Hematocrit 39.6 37.5 - 51.0 %   MCV 90 79 - 97 fL   MCH 30.8 26.6 - 33.0 pg   MCHC 34.3 31.5 - 35.7 g/dL   RDW 13.9 12.3 - 15.4 %   Platelets 204 150 - 379 x10E3/uL   Neutrophils 61 Not Estab. %   Lymphs 27 Not Estab. %   Monocytes 7 Not Estab. %   Eos 4 Not Estab. %   Basos 1 Not Estab. %   Neutrophils Absolute 4.3 1.4 - 7.0 x10E3/uL   Lymphocytes Absolute 1.9 0.7 - 3.1 x10E3/uL   Monocytes Absolute 0.5 0.1 - 0.9 x10E3/uL   EOS (ABSOLUTE) 0.3 0.0 - 0.4 x10E3/uL   Basophils Absolute 0.1 0.0 - 0.2 x10E3/uL   Immature Granulocytes 0 Not Estab. %   Immature Grans (Abs) 0.0 0.0 - 0.1 x10E3/uL  TSH  Result Value Ref Range   TSH 0.893 0.450 - 4.500 uIU/mL        Assessment & Plan:   1. S/P arthroscopy of left knee - HYDROcodone-acetaminophen (NORCO/VICODIN) 5-325 MG tablet; Take 1 tablet by mouth every 8 (eight) hours as needed  for moderate pain.  Dispense: 90 tablet; Refill: 0  2. Arthritis - HYDROcodone-acetaminophen (NORCO/VICODIN) 5-325 MG tablet; Take 1 tablet by mouth every 8 (eight) hours as needed for moderate pain.  Dispense: 90 tablet; Refill: 0 - HYDROcodone-acetaminophen (NORCO/VICODIN) 5-325 MG tablet; Take 1 tablet by mouth every 8 (eight) hours as needed for moderate pain.  Dispense: 90 tablet; Refill: 0 - HYDROcodone-acetaminophen (NORCO/VICODIN) 5-325 MG tablet; Take 1 tablet by mouth every 8 (eight) hours as needed for moderate pain.  Dispense: 90 tablet; Refill: 0    Current Outpatient Prescriptions:  .  atorvastatin (LIPITOR) 10 MG tablet, Take 1 tablet (10 mg total) by mouth daily., Disp: 90 tablet, Rfl: 1 .  HYDROcodone-acetaminophen (NORCO/VICODIN) 5-325 MG tablet, Take 1 tablet by mouth every 8 (eight) hours as needed for moderate pain., Disp: 90 tablet, Rfl: 0 .  lisinopril (PRINIVIL,ZESTRIL) 10 MG tablet, Take 1 tablet (10 mg total) by mouth daily., Disp: 90 tablet, Rfl: 3 .  loratadine (CLARITIN) 10 MG tablet, Take 1 tablet (10 mg total) by mouth daily., Disp: 90 tablet, Rfl: 3 .  meloxicam (MOBIC) 15 MG tablet, Take 1 tablet (15 mg total) by mouth daily., Disp: 90 tablet, Rfl: 3 .  omeprazole (PRILOSEC) 20 MG capsule, Take 1 Capsule by mouth 2 times a day BEFORE meals, Disp: 180 capsule, Rfl: 3 .  PROAIR HFA 108 (90 Base) MCG/ACT inhaler, Inhale 2 puffs into the lungs every 6 (six) hours as needed for wheezing or shortness of breath. , Disp: , Rfl:  .  sertraline (ZOLOFT) 100 MG tablet, Take 1 tablet (100 mg total) by mouth daily., Disp: 90 tablet, Rfl: 3 .  HYDROcodone-acetaminophen (NORCO/VICODIN) 5-325 MG tablet, Take 1 tablet by mouth every 8 (eight) hours as needed for moderate pain., Disp: 90 tablet, Rfl: 0 .   HYDROcodone-acetaminophen (NORCO/VICODIN) 5-325 MG tablet, Take 1 tablet by mouth every 8 (eight) hours as needed for moderate pain., Disp: 90 tablet, Rfl: 0 Continue all other maintenance medications as listed above.  Follow up plan: Return in about 3 months (around 12/28/2017) for recheck.  Educational handout given for [place] PA-C [place] [address]  [place], [place] [address] [phone]

## 2017-09-28 DIAGNOSIS — G4733 Obstructive sleep apnea (adult) (pediatric): Secondary | ICD-10-CM | POA: Diagnosis not present

## 2017-09-28 DIAGNOSIS — F5101 Primary insomnia: Secondary | ICD-10-CM | POA: Diagnosis not present

## 2017-09-28 DIAGNOSIS — G40909 Epilepsy, unspecified, not intractable, without status epilepticus: Secondary | ICD-10-CM | POA: Diagnosis not present

## 2017-09-28 DIAGNOSIS — G43C1 Periodic headache syndromes in child or adult, intractable: Secondary | ICD-10-CM | POA: Diagnosis not present

## 2017-10-05 ENCOUNTER — Ambulatory Visit (INDEPENDENT_AMBULATORY_CARE_PROVIDER_SITE_OTHER): Payer: Medicare Other | Admitting: *Deleted

## 2017-10-05 ENCOUNTER — Encounter: Payer: Self-pay | Admitting: *Deleted

## 2017-10-05 VITALS — BP 142/93 | HR 55 | Ht 71.0 in | Wt 228.0 lb

## 2017-10-05 DIAGNOSIS — Z1211 Encounter for screening for malignant neoplasm of colon: Secondary | ICD-10-CM

## 2017-10-05 DIAGNOSIS — Z0000 Encounter for general adult medical examination without abnormal findings: Secondary | ICD-10-CM

## 2017-10-05 NOTE — Progress Notes (Signed)
Subjective:   Bradley Fisher is a 51 y.o. male who presents for an Initial Medicare Annual Wellness Visit. Bradley Fisher is disabled and lives at home with his girlfriend. His sister, Kathrene Bongo, helps manage his finances and appointments.   Review of Systems  Reports that his health is about the same as last year.   Cardiac Risk Factors include: hypertension;male gender;sedentary lifestyle;smoking/ tobacco exposure  Other systems negative today.    Objective:    Today's Vitals   10/05/17 1022  BP: (!) 142/93  Pulse: (!) 55  Weight: 228 lb (103.4 kg)  Height: 5\' 11"  (1.803 m)   Body mass index is 31.8 kg/m.  Current Medications (verified) Outpatient Encounter Prescriptions as of 10/05/2017  Medication Sig  . atorvastatin (LIPITOR) 10 MG tablet Take 1 tablet (10 mg total) by mouth daily.  Marland Kitchen HYDROcodone-acetaminophen (NORCO/VICODIN) 5-325 MG tablet Take 1 tablet by mouth every 8 (eight) hours as needed for moderate pain.  Marland Kitchen HYDROcodone-acetaminophen (NORCO/VICODIN) 5-325 MG tablet Take 1 tablet by mouth every 8 (eight) hours as needed for moderate pain.  Marland Kitchen HYDROcodone-acetaminophen (NORCO/VICODIN) 5-325 MG tablet Take 1 tablet by mouth every 8 (eight) hours as needed for moderate pain.  Marland Kitchen lisinopril (PRINIVIL,ZESTRIL) 10 MG tablet Take 1 tablet (10 mg total) by mouth daily.  Marland Kitchen loratadine (CLARITIN) 10 MG tablet Take 1 tablet (10 mg total) by mouth daily.  . meloxicam (MOBIC) 15 MG tablet Take 1 tablet (15 mg total) by mouth daily.  Marland Kitchen omeprazole (PRILOSEC) 20 MG capsule Take 1 Capsule by mouth 2 times a day BEFORE meals  . PROAIR HFA 108 (90 Base) MCG/ACT inhaler Inhale 2 puffs into the lungs every 6 (six) hours as needed for wheezing or shortness of breath.   . sertraline (ZOLOFT) 100 MG tablet Take 1 tablet (100 mg total) by mouth daily.  Marland Kitchen zolpidem (AMBIEN) 10 MG tablet Take 10 mg by mouth at bedtime as needed for sleep.   No facility-administered encounter medications on  file as of 10/05/2017.     Allergies (verified) Patient has no known allergies.   History: Past Medical History:  Diagnosis Date  . Arthritis   . Asthma   . Depression   . GERD (gastroesophageal reflux disease)   . Hyperlipidemia   . Hypertension   . Seizures ([place])    unknow etiology; on meds for this; no seizures since on meds 10 years ago   Past Surgical History:  Procedure Laterality Date  . KNEE ARTHROSCOPY WITH MEDIAL MENISECTOMY Left 11/11/2016   Procedure: LEFT KNEE ARTHROSCOPY WITH MEDIAL MENISECTOMY;  Surgeon: Carole Civil, MD;  Location: [facility];  Service: Orthopedics;  Laterality: Left;   Family History  Problem Relation Age of Onset  . Hypertension Mother   . Diabetes Mother   . Hypertension Father   . Diabetes Father   . Diabetes Sister    Social History   Occupational History  . Not on file.   Social History Main Topics  . Smoking status: Current Every Day Smoker    Types: Cigars  . Smokeless tobacco: Current User    Types: Snuff  . Alcohol use 7.2 oz/week    12 Cans of beer per week  . Drug use: No  . Sexual activity: Yes    Birth control/ protection: None   Tobacco Counseling Ready to quit: No Counseling given: Yes   Activities of Daily Living In your present state of health, do you have any difficulty performing the following  activities: 10/05/2017 11/08/2016  Hearing? N N  Vision? N N  Difficulty concentrating or making decisions? N N  Walking or climbing stairs? N N  Dressing or bathing? N N  Doing errands, shopping? N N  Preparing Food and eating ? N -  Using the Toilet? N -  In the past six months, have you accidently leaked urine? N -  Do you have problems with loss of bowel control? N -  Managing your Medications? N -  Managing your Finances? Y -  Comment sister helps -  Housekeeping or managing your Housekeeping? N -  Comment girlfriend helps with this -  Some recent data might be hidden    Immunizations and Health  Maintenance Immunization History  Administered Date(s) Administered  . Influenza,inj,Quad PF,6+ Mos 10/27/2016, 09/21/2017   There are no preventive care reminders to display for this patient.  Patient Care Team: Theodoro Clock as PCP - General (Physician Assistant) Venetia Night, OD- optometry  Left knee arthroscopy 10/2016. No other surgeries this past year. No hospitalizations or ER visits.      Assessment:   This is a routine wellness examination for Bradley Fisher.   Hearing/Vision screen No deficits noted during visit. Recent eye exam.   Dietary issues and exercise activities discussed: Current Exercise Habits: The patient does not participate in regular exercise at present, Exercise limited by: None identified  Diet:  States that he eats all day  Goals    . Exercise 150 minutes per week (moderate activity)    . Plan meals          Try to plan meals instead of "grazing" all day      Depression Screen PHQ 2/9 Scores 09/27/2017 08/17/2017 03/09/2017 02/10/2017  PHQ - 2 Score 0 0 0 0  PHQ- 9 Score - - - -    Fall Risk Fall Risk  02/10/2017  Falls in the past year? No    Cognitive Function: MMSE - Mini Mental State Exam 10/05/2017  Not completed: Unable to complete  Orientation to time 4  Orientation to Place 2  Registration 3  Attention/ Calculation (No Data)  Attention/Calculation-comments not attempted  Recall 2  Unable to complete due to literacy and comprehension level.       Screening Tests Health Maintenance  Topic Date Due  . COLONOSCOPY  11/11/2017 (Originally 09/24/2015)  . TETANUS/TDAP  11/11/2017 (Originally 09/23/1984)  . HIV Screening  11/11/2017 (Originally 09/23/1980)  . INFLUENZA VACCINE  Completed        Plan:  Referral to GI for colonoscopy Keep f/u with PCP in Jan 2019.  Increase physical activity level. Suggested for walking for 30 min a day. Importance of smoking and tobacco cessation discussed.  Plan meals and decrease sodium  intake.   I have personally reviewed and noted the following in the patient's chart:   . Medical and social history . Use of alcohol, tobacco or illicit drugs  . Current medications and supplements . Functional ability and status . Nutritional status . Physical activity . Advanced directives . List of other physicians . Hospitalizations, surgeries, and ER visits in previous 12 months . Vitals . Screenings to include cognitive, depression, and falls . Referrals and appointments  In addition, I have reviewed and discussed with patient certain preventive protocols, quality metrics, and best practice recommendations. A written personalized care plan for preventive services as well as general preventive health recommendations were provided to patient.     Chong Sicilian, RN  10/05/2017    I have reviewed and agree with the above AWV documentation.   Terald Sleeper PA-C [facility] [address]  [place], [place] [address] [phone]

## 2017-10-05 NOTE — Patient Instructions (Addendum)
Mr. Bradley Fisher , Thank you for taking time to come for your Medicare Wellness Visit. I appreciate your ongoing commitment to your health goals. Please review the following plan we discussed and let me know if I can assist you in the future.   These are the goals we discussed: Goals    . Exercise 150 minutes per week (moderate activity)    . Plan meals          Try to plan meals instead of "grazing" all day       This is a list of the screening recommended for you and due dates:  Health Maintenance  Topic Date Due  . Colon Cancer Screening  11/11/2017*  . Tetanus Vaccine  11/11/2017*  . HIV Screening  11/11/2017*  . Flu Shot  Completed  *Topic was postponed. The date shown is not the original due date.    Steps to Quit Smoking Smoking tobacco can be harmful to your health and can affect almost every organ in your body. Smoking puts you, and those around you, at risk for developing many serious chronic diseases. Quitting smoking is difficult, but it is one of the best things that you can do for your health. It is never too late to quit. What are the benefits of quitting smoking? When you quit smoking, you lower your risk of developing serious diseases and conditions, such as:  Lung cancer or lung disease, such as COPD.  Heart disease.  Stroke.  Heart attack.  Infertility.  Osteoporosis and bone fractures.  Additionally, symptoms such as coughing, wheezing, and shortness of breath may get better when you quit. You may also find that you get sick less often because your body is stronger at fighting off colds and infections. If you are pregnant, quitting smoking can help to reduce your chances of having a baby of low birth weight. How do I get ready to quit? When you decide to quit smoking, create a plan to make sure that you are successful. Before you quit:  Pick a date to quit. Set a date within the next two weeks to give you time to prepare.  Write down the reasons why you  are quitting. Keep this list in places where you will see it often, such as on your bathroom mirror or in your car or wallet.  Identify the people, places, things, and activities that make you want to smoke (triggers) and avoid them. Make sure to take these actions: ? Throw away all cigarettes at home, at work, and in your car. ? Throw away smoking accessories, such as Scientist, research (medical). ? Clean your car and make sure to empty the ashtray. ? Clean your home, including curtains and carpets.  Tell your family, friends, and coworkers that you are quitting. Support from your loved ones can make quitting easier.  Talk with your health care provider about your options for quitting smoking.  Find out what treatment options are covered by your health insurance.  What strategies can I use to quit smoking? Talk with your healthcare provider about different strategies to quit smoking. Some strategies include:  Quitting smoking altogether instead of gradually lessening how much you smoke over a period of time. Research shows that quitting "cold [place]" is more successful than gradually quitting.  Attending in-person counseling to help you build problem-solving skills. You are more likely to have success in quitting if you attend several counseling sessions. Even short sessions of 10 minutes can be effective.  Finding resources  and support systems that can help you to quit smoking and remain smoke-free after you quit. These resources are most helpful when you use them often. They can include: ? Online chats with a Social worker. ? Telephone quitlines. ? Careers information officer. ? Support groups or group counseling. ? Text messaging programs. ? Mobile phone applications.  Taking medicines to help you quit smoking. (If you are pregnant or breastfeeding, talk with your health care provider first.) Some medicines contain nicotine and some do not. Both types of medicines help with cravings, but the  medicines that include nicotine help to relieve withdrawal symptoms. Your health care provider may recommend: ? Nicotine patches, gum, or lozenges. ? Nicotine inhalers or sprays. ? Non-nicotine medicine that is taken by mouth.  Talk with your health care provider about combining strategies, such as taking medicines while you are also receiving in-person counseling. Using these two strategies together makes you more likely to succeed in quitting than if you used either strategy on its own. If you are pregnant or breastfeeding, talk with your health care provider about finding counseling or other support strategies to quit smoking. Do not take medicine to help you quit smoking unless told to do so by your health care provider. What things can I do to make it easier to quit? Quitting smoking might feel overwhelming at first, but there is a lot that you can do to make it easier. Take these important actions:  Reach out to your family and friends and ask that they support and encourage you during this time. Call telephone quitlines, reach out to support groups, or work with a counselor for support.  Ask people who smoke to avoid smoking around you.  Avoid places that trigger you to smoke, such as bars, parties, or smoke-break areas at work.  Spend time around people who do not smoke.  Lessen stress in your life, because stress can be a smoking trigger for some people. To lessen stress, try: ? Exercising regularly. ? Deep-breathing exercises. ? Yoga. ? Meditating. ? Performing a body scan. This involves closing your eyes, scanning your body from head to toe, and noticing which parts of your body are particularly tense. Purposefully relax the muscles in those areas.  Download or purchase mobile phone or tablet apps (applications) that can help you stick to your quit plan by providing reminders, tips, and encouragement. There are many free apps, such as QuitGuide from the [employer] Office manager for Disease  Control and Prevention). You can find other support for quitting smoking (smoking cessation) through smokefree.gov and other websites.  How will I feel when I quit smoking? Within the first 24 hours of quitting smoking, you may start to feel some withdrawal symptoms. These symptoms are usually most noticeable 2-3 days after quitting, but they usually do not last beyond 2-3 weeks. Changes or symptoms that you might experience include:  Mood swings.  Restlessness, anxiety, or irritation.  Difficulty concentrating.  Dizziness.  Strong cravings for sugary foods in addition to nicotine.  Mild weight gain.  Constipation.  Nausea.  Coughing or a sore throat.  Changes in how your medicines work in your body.  A depressed mood.  Difficulty sleeping (insomnia).  After the first 2-3 weeks of quitting, you may start to notice more positive results, such as:  Improved sense of smell and taste.  Decreased coughing and sore throat.  Slower heart rate.  Lower blood pressure.  Clearer skin.  The ability to breathe more easily.  Fewer sick days.  Quitting smoking is very challenging for most people. Do not get discouraged if you are not successful the first time. Some people need to make many attempts to quit before they achieve long-term success. Do your best to stick to your quit plan, and talk with your health care provider if you have any questions or concerns. This information is not intended to replace advice given to you by your health care provider. Make sure you discuss any questions you have with your health care provider. Document Released: 11/23/2001 Document Revised: 07/27/2016 Document Reviewed: 04/15/2015 Elsevier Interactive Patient Education  2017 [employer].

## 2017-10-25 ENCOUNTER — Encounter: Payer: Self-pay | Admitting: Physician Assistant

## 2017-10-25 ENCOUNTER — Ambulatory Visit (INDEPENDENT_AMBULATORY_CARE_PROVIDER_SITE_OTHER): Payer: Medicare Other | Admitting: Physician Assistant

## 2017-10-25 VITALS — BP 135/87 | HR 58 | Temp 97.7°F | Ht 71.0 in | Wt 233.6 lb

## 2017-10-25 DIAGNOSIS — M1990 Unspecified osteoarthritis, unspecified site: Secondary | ICD-10-CM

## 2017-10-25 DIAGNOSIS — Z9889 Other specified postprocedural states: Secondary | ICD-10-CM | POA: Diagnosis not present

## 2017-10-25 DIAGNOSIS — S29011A Strain of muscle and tendon of front wall of thorax, initial encounter: Secondary | ICD-10-CM

## 2017-10-25 MED ORDER — ATORVASTATIN CALCIUM 10 MG PO TABS: 10.0000 mg | ORAL_TABLET | Freq: Every day | ORAL | 3 refills | Status: DC

## 2017-10-25 MED ORDER — HYDROCODONE-ACETAMINOPHEN 5-325 MG PO TABS: 1.0000 | ORAL_TABLET | Freq: Three times a day (TID) | ORAL | 0 refills | Status: DC | PRN

## 2017-10-25 MED ORDER — ZOLPIDEM TARTRATE 10 MG PO TABS: 10.0000 mg | ORAL_TABLET | Freq: Every evening | ORAL | 2 refills | Status: DC | PRN

## 2017-10-25 NOTE — Progress Notes (Signed)
BP 135/87   Pulse (!) 58   Temp 97.7 F (36.5 C) (Oral)   Ht _0  (1.803 m)   Wt 233 lb 9.6 oz (106 kg)   BMI 32.58 kg/m    Subjective:    Patient ID: Bradley Fisher, male    DOB: 04-Dec-1966, 51 y.o.   MRN: [id number]  HPI: Bradley Fisher is a 51 y.o. male presenting on 10/25/2017 for Pulled muscle (was carrying bird bath and feels like pulled muscle )  Patient has a left.  He was lifting something heavy and he felt a pull.  He only has rested it.  He has not taken any different anti-inflammatory.  He states that he is feeling some better.  The patient also needs refills performed to be sent to his new pharmacy.  His previous one is closing.  We reviewed all of his medications and updated the ones that are needed.  Relevant past medical, surgical, family and social history reviewed and updated as indicated. Allergies and medications reviewed and updated.  Past Medical History:  Diagnosis Date  . Arthritis   . Asthma   . Depression   . GERD (gastroesophageal reflux disease)   . Hyperlipidemia   . Hypertension   . Seizures ([place])    unknow etiology; on meds for this; no seizures since on meds 10 years ago    History reviewed. No pertinent surgical history.  Review of Systems  Constitutional: Negative.  Negative for appetite change and fatigue.  HENT: Negative.   Eyes: Negative.  Negative for pain and visual disturbance.  Respiratory: Negative.  Negative for cough, chest tightness, shortness of breath and wheezing.   Cardiovascular: Negative.  Negative for chest pain, palpitations and leg swelling.  Gastrointestinal: Negative.  Negative for abdominal pain, diarrhea, nausea and vomiting.  Endocrine: Negative.   Genitourinary: Negative.   Musculoskeletal: Positive for arthralgias, back pain and myalgias.  Skin: Negative.  Negative for color change and rash.  Neurological: Negative.  Negative for weakness, numbness and headaches.  Psychiatric/Behavioral: Negative.      Allergies as of 10/25/2017   No Known Allergies     Medication List        Accurate as of 10/25/17  3:16 PM. Always use your most recent med list.          atorvastatin 10 MG tablet Commonly known as:  LIPITOR Take 1 tablet (10 mg total) daily by mouth.   HYDROcodone-acetaminophen 5-325 MG tablet Commonly known as:  NORCO/VICODIN Take 1 tablet every 8 (eight) hours as needed by mouth for moderate pain.   HYDROcodone-acetaminophen 5-325 MG tablet Commonly known as:  NORCO/VICODIN Take 1 tablet every 8 (eight) hours as needed by mouth for moderate pain.   HYDROcodone-acetaminophen 5-325 MG tablet Commonly known as:  NORCO/VICODIN Take 1 tablet every 8 (eight) hours as needed by mouth for moderate pain.   lisinopril 10 MG tablet Commonly known as:  PRINIVIL,ZESTRIL Take 1 tablet (10 mg total) by mouth daily.   loratadine 10 MG tablet Commonly known as:  CLARITIN Take 1 tablet (10 mg total) by mouth daily.   meloxicam 15 MG tablet Commonly known as:  MOBIC Take 1 tablet (15 mg total) by mouth daily.   omeprazole 20 MG capsule Commonly known as:  PRILOSEC Take 1 Capsule by mouth 2 times a day BEFORE meals   PROAIR HFA 108 (90 Base) MCG/ACT inhaler Generic drug:  albuterol Inhale 2 puffs into the lungs every 6 (six) hours as  needed for wheezing or shortness of breath.   sertraline 100 MG tablet Commonly known as:  ZOLOFT Take 1 tablet (100 mg total) by mouth daily.   zolpidem 10 MG tablet Commonly known as:  AMBIEN Take 1 tablet (10 mg total) at bedtime as needed by mouth for sleep.          Objective:    BP 135/87   Pulse (!) 58   Temp 97.7 F (36.5 C) (Oral)   Ht _0  (1.803 m)   Wt 233 lb 9.6 oz (106 kg)   BMI 32.58 kg/m   No Known Allergies  Physical Exam  Constitutional: He appears well-developed and well-nourished. No distress.  HENT:  Head: Normocephalic and atraumatic.  Eyes: Conjunctivae and EOM are normal. Pupils are equal,  round, and reactive to light.  Cardiovascular: Normal rate, regular rhythm and normal heart sounds.  Pulmonary/Chest: Effort normal and breath sounds normal. No respiratory distress.  Musculoskeletal:       Left shoulder: He exhibits tenderness. He exhibits normal range of motion, no bony tenderness and no swelling.       Arms: Skin: Skin is warm and dry.  Psychiatric: He has a normal mood and affect. His behavior is normal.  Nursing note and vitals reviewed.   Results for orders placed or performed in visit on 08/17/17  CMP14+EGFR  Result Value Ref Range   Glucose 105 (H) 65 - 99 mg/dL   BUN 17 6 - 24 mg/dL   Creatinine, Ser 0.85 0.76 - 1.27 mg/dL   GFR calc non Af Amer 101 >59 mL/min/1.73   GFR calc Af Amer 117 >59 mL/min/1.73   BUN/Creatinine Ratio 20 9 - 20   Sodium 138 134 - 144 mmol/L   Potassium 4.1 3.5 - 5.2 mmol/L   Chloride 101 96 - 106 mmol/L   CO2 23 20 - 29 mmol/L   Calcium 9.7 8.7 - 10.2 mg/dL   Total Protein 7.2 6.0 - 8.5 g/dL   Albumin 4.6 3.5 - 5.5 g/dL   Globulin, Total 2.6 1.5 - 4.5 g/dL   Albumin/Globulin Ratio 1.8 1.2 - 2.2   Bilirubin Total 0.4 0.0 - 1.2 mg/dL   Alkaline Phosphatase 96 39 - 117 IU/L   AST 32 0 - 40 IU/L   ALT 19 0 - 44 IU/L  CBC with Differential/Platelet  Result Value Ref Range   WBC 7.1 3.4 - 10.8 x10E3/uL   RBC 4.42 4.14 - 5.80 x10E6/uL   Hemoglobin 13.6 13.0 - 17.7 g/dL   Hematocrit 39.6 37.5 - 51.0 %   MCV 90 79 - 97 fL   MCH 30.8 26.6 - 33.0 pg   MCHC 34.3 31.5 - 35.7 g/dL   RDW 13.9 12.3 - 15.4 %   Platelets 204 150 - 379 x10E3/uL   Neutrophils 61 Not Estab. %   Lymphs 27 Not Estab. %   Monocytes 7 Not Estab. %   Eos 4 Not Estab. %   Basos 1 Not Estab. %   Neutrophils Absolute 4.3 1.4 - 7.0 x10E3/uL   Lymphocytes Absolute 1.9 0.7 - 3.1 x10E3/uL   Monocytes Absolute 0.5 0.1 - 0.9 x10E3/uL   EOS (ABSOLUTE) 0.3 0.0 - 0.4 x10E3/uL   Basophils Absolute 0.1 0.0 - 0.2 x10E3/uL   Immature Granulocytes 0 Not Estab. %    Immature Grans (Abs) 0.0 0.0 - 0.1 x10E3/uL  TSH  Result Value Ref Range   TSH 0.893 0.450 - 4.500 uIU/mL      Assessment &  Plan:   1. S/P arthroscopy of left knee - HYDROcodone-acetaminophen (NORCO/VICODIN) 5-325 MG tablet; Take 1 tablet every 8 (eight) hours as needed by mouth for moderate pain.  Dispense: 90 tablet; Refill: 0  2. Arthritis - HYDROcodone-acetaminophen (NORCO/VICODIN) 5-325 MG tablet; Take 1 tablet every 8 (eight) hours as needed by mouth for moderate pain.  Dispense: 90 tablet; Refill: 0 - HYDROcodone-acetaminophen (NORCO/VICODIN) 5-325 MG tablet; Take 1 tablet every 8 (eight) hours as needed by mouth for moderate pain.  Dispense: 90 tablet; Refill: 0 - HYDROcodone-acetaminophen (NORCO/VICODIN) 5-325 MG tablet; Take 1 tablet every 8 (eight) hours as needed by mouth for moderate pain.  Dispense: 90 tablet; Refill: 0  3. Muscle strain of chest wall, initial encounter Continue rest, medications, and no lifting.   Current Outpatient Medications:  .  atorvastatin (LIPITOR) 10 MG tablet, Take 1 tablet (10 mg total) daily by mouth., Disp: 90 tablet, Rfl: 3 .  HYDROcodone-acetaminophen (NORCO/VICODIN) 5-325 MG tablet, Take 1 tablet every 8 (eight) hours as needed by mouth for moderate pain., Disp: 90 tablet, Rfl: 0 .  HYDROcodone-acetaminophen (NORCO/VICODIN) 5-325 MG tablet, Take 1 tablet every 8 (eight) hours as needed by mouth for moderate pain., Disp: 90 tablet, Rfl: 0 .  HYDROcodone-acetaminophen (NORCO/VICODIN) 5-325 MG tablet, Take 1 tablet every 8 (eight) hours as needed by mouth for moderate pain., Disp: 90 tablet, Rfl: 0 .  lisinopril (PRINIVIL,ZESTRIL) 10 MG tablet, Take 1 tablet (10 mg total) by mouth daily., Disp: 90 tablet, Rfl: 3 .  loratadine (CLARITIN) 10 MG tablet, Take 1 tablet (10 mg total) by mouth daily., Disp: 90 tablet, Rfl: 3 .  meloxicam (MOBIC) 15 MG tablet, Take 1 tablet (15 mg total) by mouth daily., Disp: 90 tablet, Rfl: 3 .  omeprazole (PRILOSEC)  20 MG capsule, Take 1 Capsule by mouth 2 times a day BEFORE meals, Disp: 180 capsule, Rfl: 3 .  PROAIR HFA 108 (90 Base) MCG/ACT inhaler, Inhale 2 puffs into the lungs every 6 (six) hours as needed for wheezing or shortness of breath. , Disp: , Rfl:  .  sertraline (ZOLOFT) 100 MG tablet, Take 1 tablet (100 mg total) by mouth daily., Disp: 90 tablet, Rfl: 3 .  zolpidem (AMBIEN) 10 MG tablet, Take 1 tablet (10 mg total) at bedtime as needed by mouth for sleep., Disp: 30 tablet, Rfl: 2 Continue all other maintenance medications as listed above.  Follow up plan: Return in about 3 months (around 01/25/2018) for recheck.  Educational handout given for [place] PA-C [facility] [address]  [place], [place] [address] [phone]   10/25/2017, 3:16 PM

## 2017-10-25 NOTE — Patient Instructions (Signed)
In a few days you may receive a survey in the mail or online from [employer] regarding your visit with us today. Please take a moment to fill this out. Your feedback is very important to our whole office. It can help us better understand your needs as well as improve your experience and satisfaction. Thank you for taking your time to complete it. We care about you.  Hurshell Dino, PA-C  

## 2017-11-16 ENCOUNTER — Telehealth: Payer: Self-pay | Admitting: Physician Assistant

## 2017-11-16 ENCOUNTER — Other Ambulatory Visit: Payer: Self-pay | Admitting: *Deleted

## 2017-11-16 DIAGNOSIS — G4700 Insomnia, unspecified: Secondary | ICD-10-CM | POA: Diagnosis not present

## 2017-11-16 DIAGNOSIS — G40909 Epilepsy, unspecified, not intractable, without status epilepticus: Secondary | ICD-10-CM | POA: Diagnosis not present

## 2017-11-16 DIAGNOSIS — Z79899 Other long term (current) drug therapy: Secondary | ICD-10-CM | POA: Diagnosis not present

## 2017-11-16 DIAGNOSIS — J301 Allergic rhinitis due to pollen: Secondary | ICD-10-CM

## 2017-11-16 DIAGNOSIS — F411 Generalized anxiety disorder: Secondary | ICD-10-CM | POA: Diagnosis not present

## 2017-11-16 MED ORDER — LORATADINE 10 MG PO TABS: 10.0000 mg | ORAL_TABLET | Freq: Every day | ORAL | 1 refills | Status: DC

## 2017-11-16 NOTE — Telephone Encounter (Signed)
Spoke with patient and advised to call pharmacy to see why medication was not filled. We have only received a request on claritin.

## 2017-11-28 ENCOUNTER — Encounter: Payer: Self-pay | Admitting: Physician Assistant

## 2017-11-28 ENCOUNTER — Ambulatory Visit (INDEPENDENT_AMBULATORY_CARE_PROVIDER_SITE_OTHER): Payer: Medicare Other | Admitting: Physician Assistant

## 2017-11-28 VITALS — BP 127/81 | HR 55 | Temp 96.0°F | Ht 71.0 in | Wt 232.8 lb

## 2017-11-28 DIAGNOSIS — M1990 Unspecified osteoarthritis, unspecified site: Secondary | ICD-10-CM | POA: Diagnosis not present

## 2017-11-28 DIAGNOSIS — Z1211 Encounter for screening for malignant neoplasm of colon: Secondary | ICD-10-CM

## 2017-11-28 MED ORDER — HYDROCODONE-ACETAMINOPHEN 5-325 MG PO TABS: 1.0000 | ORAL_TABLET | Freq: Three times a day (TID) | ORAL | 0 refills | Status: DC | PRN

## 2017-11-28 NOTE — Progress Notes (Signed)
BP 127/81   Pulse (!) 55   Temp (!) 96 F (35.6 C) (Oral)   Ht '5\' 11"'  (1.803 m)   Wt 232 lb 12.8 oz (105.6 kg)   BMI 32.47 kg/m    Subjective:    Patient ID: Bradley Fisher, male    DOB: [birth year], 51 y.o.   MRN: [id number]  HPI: Bradley Fisher is a 51 y.o. male presenting on 11/28/2017 for Follow-up and Medication Refill  Patient comes in for 54-monthrecheck on his chronic pain related to arthritis.  All the new medications will be refilled.  He is going to [facility] now.  In addition he is having some constipation and abdominal pain. Need for colonoscopy is discussed. Referral placed.  Needs labs at next visit.  Relevant past medical, surgical, family and social history reviewed and updated as indicated. Allergies and medications reviewed and updated.  Past Medical History:  Diagnosis Date  . Arthritis   . Asthma   . Depression   . GERD (gastroesophageal reflux disease)   . Hyperlipidemia   . Hypertension   . [place]    [place]; on meds for this; no seizures since on meds 10 years ago    Past Surgical History:  Procedure Laterality Date  . KNEE ARTHROSCOPY WITH MEDIAL MENISECTOMY Left 11/11/2016   Procedure: LEFT KNEE ARTHROSCOPY WITH MEDIAL MENISECTOMY;  Surgeon: SCarole Civil MD;  Location: [facility];  Service: Orthopedics;  Laterality: Left;    Review of Systems  Constitutional: Negative.  Negative for appetite change and fatigue.  Eyes: Negative for pain and visual disturbance.  Respiratory: Negative.  Negative for cough, chest tightness, shortness of breath and wheezing.   Cardiovascular: Negative.  Negative for chest pain, palpitations and leg swelling.  Gastrointestinal: Positive for abdominal pain and constipation. Negative for diarrhea, nausea and vomiting.  Genitourinary: Negative.   Musculoskeletal: Positive for arthralgias, back pain and joint swelling.  Skin: Negative.  Negative for color change and rash.  Neurological:  Negative.  Negative for weakness, numbness and headaches.  Psychiatric/Behavioral: Negative.     Allergies as of 11/28/2017   No Known Allergies     Medication List        Accurate as of 11/28/17 10:27 AM. Always use your most recent med list.          atorvastatin 10 MG tablet Commonly known as:  LIPITOR Take 1 tablet (10 mg total) daily by mouth.   HYDROcodone-acetaminophen 5-325 MG tablet Commonly known as:  NORCO/VICODIN Take 1 tablet by mouth every 8 (eight) hours as needed for moderate pain.   HYDROcodone-acetaminophen 5-325 MG tablet Commonly known as:  NORCO/VICODIN Take 1 tablet by mouth every 8 (eight) hours as needed for moderate pain.   HYDROcodone-acetaminophen 5-325 MG tablet Commonly known as:  NORCO/VICODIN Take 1 tablet by mouth every 8 (eight) hours as needed for moderate pain.   lisinopril 10 MG tablet Commonly known as:  PRINIVIL,ZESTRIL Take 1 tablet (10 mg total) by mouth daily.   loratadine 10 MG tablet Commonly known as:  CLARITIN Take 1 tablet (10 mg total) by mouth daily.   meloxicam 15 MG tablet Commonly known as:  MOBIC Take 1 tablet (15 mg total) by mouth daily.   omeprazole 20 MG capsule Commonly known as:  PRILOSEC Take 1 Capsule by mouth 2 times a day BEFORE meals   PROAIR HFA 108 (90 Base) MCG/ACT inhaler Generic drug:  albuterol Inhale 2 puffs into the lungs every 6 (  six) hours as needed for wheezing or shortness of breath.   sertraline 100 MG tablet Commonly known as:  ZOLOFT Take 1 tablet (100 mg total) by mouth daily.   zolpidem 10 MG tablet Commonly known as:  AMBIEN Take 1 tablet (10 mg total) at bedtime as needed by mouth for sleep.          Objective:    BP 127/81   Pulse (!) 55   Temp (!) 96 F (35.6 C) (Oral)   Ht '5\' 11"'  (1.803 m)   Wt 232 lb 12.8 oz (105.6 kg)   BMI 32.47 kg/m   No Known Allergies  Physical Exam  Constitutional: He appears well-developed and well-nourished. No distress.  HENT:    Head: Normocephalic and atraumatic.  Eyes: Conjunctivae and EOM are normal. Pupils are equal, round, and reactive to light.  Cardiovascular: Normal rate, regular rhythm and normal heart sounds.  Pulmonary/Chest: Effort normal and breath sounds normal. No respiratory distress.  Skin: Skin is warm and dry.  Psychiatric: He has a normal mood and affect. His behavior is normal.  Nursing note and vitals reviewed.   Results for orders placed or performed in visit on 08/17/17  CMP14+EGFR  Result Value Ref Range   Glucose 105 (H) 65 - 99 mg/dL   BUN 17 6 - 24 mg/dL   Creatinine, Ser 0.85 0.76 - 1.27 mg/dL   GFR calc non Af Amer 101 >59 mL/min/1.73   GFR calc Af Amer 117 >59 mL/min/1.73   BUN/Creatinine Ratio 20 9 - 20   Sodium 138 134 - 144 mmol/L   Potassium 4.1 3.5 - 5.2 mmol/L   Chloride 101 96 - 106 mmol/L   CO2 23 20 - 29 mmol/L   Calcium 9.7 8.7 - 10.2 mg/dL   Total Protein 7.2 6.0 - 8.5 g/dL   Albumin 4.6 3.5 - 5.5 g/dL   Globulin, Total 2.6 1.5 - 4.5 g/dL   Albumin/Globulin Ratio 1.8 1.2 - 2.2   Bilirubin Total 0.4 0.0 - 1.2 mg/dL   Alkaline Phosphatase 96 39 - 117 IU/L   AST 32 0 - 40 IU/L   ALT 19 0 - 44 IU/L  CBC with Differential/Platelet  Result Value Ref Range   WBC 7.1 3.4 - 10.8 x10E3/uL   RBC 4.42 4.14 - 5.80 x10E6/uL   Hemoglobin 13.6 13.0 - 17.7 g/dL   Hematocrit 39.6 37.5 - 51.0 %   MCV 90 79 - 97 fL   MCH 30.8 26.6 - 33.0 pg   MCHC 34.3 31.5 - 35.7 g/dL   RDW 13.9 12.3 - 15.4 %   Platelets 204 150 - 379 x10E3/uL   Neutrophils 61 Not Estab. %   Lymphs 27 Not Estab. %   Monocytes 7 Not Estab. %   Eos 4 Not Estab. %   Basos 1 Not Estab. %   Neutrophils Absolute 4.3 1.4 - 7.0 x10E3/uL   Lymphocytes Absolute 1.9 0.7 - 3.1 x10E3/uL   Monocytes Absolute 0.5 0.1 - 0.9 x10E3/uL   EOS (ABSOLUTE) 0.3 0.0 - 0.4 x10E3/uL   Basophils Absolute 0.1 0.0 - 0.2 x10E3/uL   Immature Granulocytes 0 Not Estab. %   Immature Grans (Abs) 0.0 0.0 - 0.1 x10E3/uL  TSH  Result  Value Ref Range   TSH 0.893 0.450 - 4.500 uIU/mL      Assessment & Plan:   1. Arthritis - HYDROcodone-acetaminophen (NORCO/VICODIN) 5-325 MG tablet; Take 1 tablet by mouth every 8 (eight) hours as needed for moderate pain.  Dispense: 90 tablet; Refill: 0 - HYDROcodone-acetaminophen (NORCO/VICODIN) 5-325 MG tablet; Take 1 tablet by mouth every 8 (eight) hours as needed for moderate pain.  Dispense: 90 tablet; Refill: 0 - HYDROcodone-acetaminophen (NORCO/VICODIN) 5-325 MG tablet; Take 1 tablet by mouth every 8 (eight) hours as needed for moderate pain.  Dispense: 90 tablet; Refill: 0  2. Encounter for screening colonoscopy - Ambulatory referral to Gastroenterology    Current Outpatient Medications:  .  atorvastatin (LIPITOR) 10 MG tablet, Take 1 tablet (10 mg total) daily by mouth., Disp: 90 tablet, Rfl: 3 .  HYDROcodone-acetaminophen (NORCO/VICODIN) 5-325 MG tablet, Take 1 tablet by mouth every 8 (eight) hours as needed for moderate pain., Disp: 90 tablet, Rfl: 0 .  HYDROcodone-acetaminophen (NORCO/VICODIN) 5-325 MG tablet, Take 1 tablet by mouth every 8 (eight) hours as needed for moderate pain., Disp: 90 tablet, Rfl: 0 .  HYDROcodone-acetaminophen (NORCO/VICODIN) 5-325 MG tablet, Take 1 tablet by mouth every 8 (eight) hours as needed for moderate pain., Disp: 90 tablet, Rfl: 0 .  lisinopril (PRINIVIL,ZESTRIL) 10 MG tablet, Take 1 tablet (10 mg total) by mouth daily., Disp: 90 tablet, Rfl: 3 .  loratadine (CLARITIN) 10 MG tablet, Take 1 tablet (10 mg total) by mouth daily., Disp: 90 tablet, Rfl: 1 .  meloxicam (MOBIC) 15 MG tablet, Take 1 tablet (15 mg total) by mouth daily., Disp: 90 tablet, Rfl: 3 .  omeprazole (PRILOSEC) 20 MG capsule, Take 1 Capsule by mouth 2 times a day BEFORE meals, Disp: 180 capsule, Rfl: 3 .  PROAIR HFA 108 (90 Base) MCG/ACT inhaler, Inhale 2 puffs into the lungs every 6 (six) hours as needed for wheezing or shortness of breath. , Disp: , Rfl:  .  sertraline  (ZOLOFT) 100 MG tablet, Take 1 tablet (100 mg total) by mouth daily., Disp: 90 tablet, Rfl: 3 .  zolpidem (AMBIEN) 10 MG tablet, Take 1 tablet (10 mg total) at bedtime as needed by mouth for sleep., Disp: 30 tablet, Rfl: 2 Continue all other maintenance medications as listed above.  Follow up plan: Return in about 3 months (around 02/26/2018) for recheck.  Educational handout given for [place] PA-C [facility] [address]  [place], [place] [address] [phone]   11/28/2017, 10:27 AM

## 2017-11-28 NOTE — Patient Instructions (Signed)
In a few days you may receive a survey in the mail or online from [employer] regarding your visit with us today. Please take a moment to fill this out. Your feedback is very important to our whole office. It can help us better understand your needs as well as improve your experience and satisfaction. Thank you for taking your time to complete it. We care about you.  [place] Depaz, PA-C  

## 2017-11-29 ENCOUNTER — Encounter: Payer: Self-pay | Admitting: Internal Medicine

## 2017-12-28 ENCOUNTER — Ambulatory Visit: Payer: Medicare Other | Admitting: Physician Assistant

## 2018-01-23 ENCOUNTER — Ambulatory Visit: Payer: Medicare Other | Admitting: Gastroenterology

## 2018-01-25 ENCOUNTER — Ambulatory Visit: Payer: Medicare Other | Admitting: Physician Assistant

## 2018-02-15 ENCOUNTER — Ambulatory Visit: Payer: Medicaid Other | Admitting: Physician Assistant

## 2018-02-27 ENCOUNTER — Encounter: Payer: Self-pay | Admitting: Physician Assistant

## 2018-02-27 ENCOUNTER — Ambulatory Visit (INDEPENDENT_AMBULATORY_CARE_PROVIDER_SITE_OTHER): Payer: Medicare Other | Admitting: Physician Assistant

## 2018-02-27 VITALS — BP 143/85 | HR 51 | Temp 96.6°F | Ht 71.0 in | Wt 230.0 lb

## 2018-02-27 DIAGNOSIS — M1990 Unspecified osteoarthritis, unspecified site: Secondary | ICD-10-CM

## 2018-02-27 DIAGNOSIS — Z0000 Encounter for general adult medical examination without abnormal findings: Secondary | ICD-10-CM

## 2018-02-27 MED ORDER — HYDROCODONE-ACETAMINOPHEN 10-325 MG PO TABS: 1.0000 | ORAL_TABLET | Freq: Three times a day (TID) | ORAL | 0 refills | Status: DC | PRN

## 2018-02-27 MED ORDER — HYDROCODONE-ACETAMINOPHEN 10-325 MG PO TABS
1.0000 | ORAL_TABLET | Freq: Three times a day (TID) | ORAL | 0 refills | Status: DC | PRN
Start: 2018-02-27 — End: 2018-03-27

## 2018-02-27 NOTE — Progress Notes (Signed)
BP (!) 143/85   Pulse (!) 51   Temp (!) 96.6 F (35.9 C) (Oral)   Ht 5' 11" (1.803 m)   Wt 230 lb (104.3 kg)   BMI 32.08 kg/m    Subjective:    Patient ID: Bradley Fisher, male    DOB: 08/11/1966, 52 y.o.   MRN: [id number]  HPI: Bradley Fisher is a 52 y.o. male presenting on 02/27/2018 for Follow-up (3 mo and refills )  Chronic pain is under control, patient has no new complaints. Medications are keeping things stable. Needs refills for the next three months.  [place] Controlled Substance website checked and normal. Drug screen normal this year.  Patient is here for recheck on his other conditions.  His rheumatoid arthritis is not bothering him too badly at this time.  He has been seizure-free.  His neurologist stopped his antiepileptic medication.  He states that overall he has been feeling well.  Labs are due today.  The last time was 6 months ago.  Past Medical History:  Diagnosis Date  . Arthritis   . Asthma   . Depression   . GERD (gastroesophageal reflux disease)   . Hyperlipidemia   . Hypertension   . Seizures ([place])    unknow etiology; on meds for this; no seizures since on meds 10 years ago   Relevant past medical, surgical, family and social history reviewed and updated as indicated. Interim medical history since our last visit reviewed. Allergies and medications reviewed and updated. DATA REVIEWED: CHART IN EPIC  Family History reviewed for pertinent findings.  Review of Systems  Constitutional: Negative.  Negative for appetite change and fatigue.  HENT: Negative.   Eyes: Negative.  Negative for pain and visual disturbance.  Respiratory: Negative.  Negative for cough, chest tightness, shortness of breath and wheezing.   Cardiovascular: Negative.  Negative for chest pain, palpitations and leg swelling.  Gastrointestinal: Negative.  Negative for abdominal pain, diarrhea, nausea and vomiting.  Endocrine: Negative.   Genitourinary: Negative.   Musculoskeletal:  Positive for arthralgias, back pain, gait problem and joint swelling.  Skin: Negative.  Negative for color change and rash.  Neurological: Negative for weakness, numbness and headaches.  Psychiatric/Behavioral: Negative.     Allergies as of 02/27/2018   No Known Allergies     Medication List        Accurate as of 02/27/18  2:20 PM. Always use your most recent med list.          atorvastatin 10 MG tablet Commonly known as:  LIPITOR Take 1 tablet (10 mg total) daily by mouth.   HYDROcodone-acetaminophen 10-325 MG tablet Commonly known as:  NORCO Take 1 tablet by mouth every 8 (eight) hours as needed.   HYDROcodone-acetaminophen 10-325 MG tablet Commonly known as:  NORCO Take 1 tablet by mouth every 8 (eight) hours as needed. Start taking on:  03/28/2018   HYDROcodone-acetaminophen 10-325 MG tablet Commonly known as:  NORCO Take 1 tablet by mouth every 8 (eight) hours as needed. Start taking on:  04/26/2018   lisinopril 10 MG tablet Commonly known as:  PRINIVIL,ZESTRIL Take 1 tablet (10 mg total) by mouth daily.   loratadine 10 MG tablet Commonly known as:  CLARITIN Take 1 tablet (10 mg total) by mouth daily.   meloxicam 15 MG tablet Commonly known as:  MOBIC Take 1 tablet (15 mg total) by mouth daily.   omeprazole 20 MG capsule Commonly known as:  PRILOSEC Take 1 Capsule by mouth 2  times a day BEFORE meals   PROAIR HFA 108 (90 Base) MCG/ACT inhaler Generic drug:  albuterol Inhale 2 puffs into the lungs every 6 (six) hours as needed for wheezing or shortness of breath.   sertraline 100 MG tablet Commonly known as:  ZOLOFT Take 1 tablet (100 mg total) by mouth daily.          Objective:    BP (!) 143/85   Pulse (!) 51   Temp (!) 96.6 F (35.9 C) (Oral)   Ht 5' 11" (1.803 m)   Wt 230 lb (104.3 kg)   BMI 32.08 kg/m   No Known Allergies  Wt Readings from Last 3 Encounters:  02/27/18 230 lb (104.3 kg)  11/28/17 232 lb 12.8 oz (105.6 kg)  10/25/17 233  lb 9.6 oz (106 kg)    Physical Exam  Constitutional: He appears well-developed and well-nourished. No distress.  HENT:  Head: Normocephalic and atraumatic.  Eyes: Conjunctivae and EOM are normal. Pupils are equal, round, and reactive to light.  Cardiovascular: Normal rate, regular rhythm and normal heart sounds.  Pulmonary/Chest: Effort normal and breath sounds normal. No respiratory distress.  Skin: Skin is warm and dry.  Psychiatric: He has a normal mood and affect. His behavior is normal.  Nursing note and vitals reviewed.   Results for orders placed or performed in visit on 08/17/17  CMP14+EGFR  Result Value Ref Range   Glucose 105 (H) 65 - 99 mg/dL   BUN 17 6 - 24 mg/dL   Creatinine, Ser 0.85 0.76 - 1.27 mg/dL   GFR calc non Af Amer 101 >59 mL/min/1.73   GFR calc Af Amer 117 >59 mL/min/1.73   BUN/Creatinine Ratio 20 9 - 20   Sodium 138 134 - 144 mmol/L   Potassium 4.1 3.5 - 5.2 mmol/L   Chloride 101 96 - 106 mmol/L   CO2 23 20 - 29 mmol/L   Calcium 9.7 8.7 - 10.2 mg/dL   Total Protein 7.2 6.0 - 8.5 g/dL   Albumin 4.6 3.5 - 5.5 g/dL   Globulin, Total 2.6 1.5 - 4.5 g/dL   Albumin/Globulin Ratio 1.8 1.2 - 2.2   Bilirubin Total 0.4 0.0 - 1.2 mg/dL   Alkaline Phosphatase 96 39 - 117 IU/L   AST 32 0 - 40 IU/L   ALT 19 0 - 44 IU/L  CBC with Differential/Platelet  Result Value Ref Range   WBC 7.1 3.4 - 10.8 x10E3/uL   RBC 4.42 4.14 - 5.80 x10E6/uL   Hemoglobin 13.6 13.0 - 17.7 g/dL   Hematocrit 39.6 37.5 - 51.0 %   MCV 90 79 - 97 fL   MCH 30.8 26.6 - 33.0 pg   MCHC 34.3 31.5 - 35.7 g/dL   RDW 13.9 12.3 - 15.4 %   Platelets 204 150 - 379 x10E3/uL   Neutrophils 61 Not Estab. %   Lymphs 27 Not Estab. %   Monocytes 7 Not Estab. %   Eos 4 Not Estab. %   Basos 1 Not Estab. %   Neutrophils Absolute 4.3 1.4 - 7.0 x10E3/uL   Lymphocytes Absolute 1.9 0.7 - 3.1 x10E3/uL   Monocytes Absolute 0.5 0.1 - 0.9 x10E3/uL   EOS (ABSOLUTE) 0.3 0.0 - 0.4 x10E3/uL   Basophils Absolute  0.1 0.0 - 0.2 x10E3/uL   Immature Granulocytes 0 Not Estab. %   Immature Grans (Abs) 0.0 0.0 - 0.1 x10E3/uL  TSH  Result Value Ref Range   TSH 0.893 0.450 - 4.500 uIU/mL  Assessment & Plan:   1. Well adult exam - CBC with Differential/Platelet - CMP14+EGFR - Lipid panel - PSA  2. Arthritis - HYDROcodone-acetaminophen (NORCO) 10-325 MG tablet; Take 1 tablet by mouth every 8 (eight) hours as needed.  Dispense: 30 tablet; Refill: 0 - HYDROcodone-acetaminophen (NORCO) 10-325 MG tablet; Take 1 tablet by mouth every 8 (eight) hours as needed.  Dispense: 30 tablet; Refill: 0 - HYDROcodone-acetaminophen (NORCO) 10-325 MG tablet; Take 1 tablet by mouth every 8 (eight) hours as needed.  Dispense: 90 tablet; Refill: 0    Continue all other maintenance medications as listed above.  Follow up plan: Return in about 3 months (around 05/30/2018) for recheck.  Educational handout given for [place] PA-C [facility] [address]  [place], [place] [address] [phone]   02/27/2018, 2:20 PM

## 2018-02-27 NOTE — Patient Instructions (Signed)
In a few days you may receive a survey in the mail or online from [employer] regarding your visit with us today. Please take a moment to fill this out. Your feedback is very important to our whole office. It can help us better understand your needs as well as improve your experience and satisfaction. Thank you for taking your time to complete it. We care about you.  Nahdia Doucet, PA-C  

## 2018-02-28 LAB — CMP14+EGFR
A/G RATIO: 1.8 (ref 1.2–2.2)
ALK PHOS: 102 IU/L (ref 39–117)
ALT: 19 IU/L (ref 0–44)
AST: 29 IU/L (ref 0–40)
Albumin: 4.6 g/dL (ref 3.5–5.5)
BILIRUBIN TOTAL: 0.4 mg/dL (ref 0.0–1.2)
BUN/Creatinine Ratio: 19 (ref 9–20)
BUN: 15 mg/dL (ref 6–24)
CHLORIDE: 100 mmol/L (ref 96–106)
CO2: 24 mmol/L (ref 20–29)
Calcium: 9.7 mg/dL (ref 8.7–10.2)
Creatinine, Ser: 0.77 mg/dL (ref 0.76–1.27)
GFR calc Af Amer: 121 mL/min/{1.73_m2} (ref >59)
GFR calc non Af Amer: 104 mL/min/{1.73_m2} (ref >59)
GLUCOSE: 95 mg/dL (ref 65–99)
Globulin, Total: 2.5 g/dL (ref 1.5–4.5)
POTASSIUM: 4.2 mmol/L (ref 3.5–5.2)
Sodium: 141 mmol/L (ref 134–144)
Total Protein: 7.1 g/dL (ref 6.0–8.5)

## 2018-02-28 LAB — CBC WITH DIFFERENTIAL/PLATELET
BASOS ABS: 0.1 10*3/uL (ref 0.0–0.2)
BASOS: 1 %
EOS (ABSOLUTE): 0.3 10*3/uL (ref 0.0–0.4)
Eos: 5 %
Hematocrit: 39.8 % (ref 37.5–51.0)
Hemoglobin: 14.4 g/dL (ref 13.0–17.7)
Immature Grans (Abs): 0 10*3/uL (ref 0.0–0.1)
Immature Granulocytes: 0 %
LYMPHS ABS: 1.8 10*3/uL (ref 0.7–3.1)
Lymphs: 27 %
MCH: 30.9 pg (ref 26.6–33.0)
MCHC: 36.2 g/dL — AB (ref 31.5–35.7)
MCV: 85 fL (ref 79–97)
MONOS ABS: 0.4 10*3/uL (ref 0.1–0.9)
Monocytes: 7 %
NEUTROS ABS: 4 10*3/uL (ref 1.4–7.0)
Neutrophils: 60 %
PLATELETS: 191 10*3/uL (ref 150–379)
RBC: 4.66 x10E6/uL (ref 4.14–5.80)
RDW: 13.5 % (ref 12.3–15.4)
WBC: 6.6 10*3/uL (ref 3.4–10.8)

## 2018-02-28 LAB — LIPID PANEL
CHOLESTEROL TOTAL: 150 mg/dL (ref 100–199)
Chol/HDL Ratio: 3.4 ratio (ref 0.0–5.0)
HDL: 44 mg/dL (ref >39)
LDL Calculated: 74 mg/dL (ref 0–99)
Triglycerides: 158 mg/dL — ABNORMAL HIGH (ref 0–149)
VLDL CHOLESTEROL CAL: 32 mg/dL (ref 5–40)

## 2018-02-28 LAB — PSA: Prostate Specific Ag, Serum: 0.2 ng/mL (ref 0.0–4.0)

## 2018-03-27 ENCOUNTER — Ambulatory Visit (INDEPENDENT_AMBULATORY_CARE_PROVIDER_SITE_OTHER): Payer: Medicare Other | Admitting: Gastroenterology

## 2018-03-27 ENCOUNTER — Other Ambulatory Visit: Payer: Self-pay

## 2018-03-27 ENCOUNTER — Encounter: Payer: Self-pay | Admitting: Gastroenterology

## 2018-03-27 ENCOUNTER — Telehealth: Payer: Self-pay

## 2018-03-27 DIAGNOSIS — R52 Pain, unspecified: Secondary | ICD-10-CM | POA: Insufficient documentation

## 2018-03-27 DIAGNOSIS — Z1211 Encounter for screening for malignant neoplasm of colon: Secondary | ICD-10-CM | POA: Diagnosis not present

## 2018-03-27 DIAGNOSIS — K5900 Constipation, unspecified: Secondary | ICD-10-CM | POA: Insufficient documentation

## 2018-03-27 MED ORDER — CLENPIQ 10-3.5-12 MG-GM -GM/160ML PO SOLN: 1.0000 | Freq: Once | ORAL | 0 refills | Status: AC

## 2018-03-27 NOTE — Assessment & Plan Note (Signed)
Stools are less frequent on pain medication but not hard.  We discussed optimizing bowel prep prior to colonoscopy with use of Linzess 145 mcg daily beginning 5 days prior to his bowel prep.  He does not feel he needs to take something every day in general but is willing to do this for bowel preparation.  Samples provided.

## 2018-03-27 NOTE — Patient Instructions (Signed)
1. Colonoscopy as scheduled. See separate instructions.  

## 2018-03-27 NOTE — Telephone Encounter (Signed)
Called and informed pt's girlfriend of pre-op appt 05/23/18 at 10:00am. Letter mailed.

## 2018-03-27 NOTE — Assessment & Plan Note (Addendum)
First ever screening colonoscopy in the near future.  Given chronic pain medication, plan for deep sedation.  I have discussed the risks, alternatives, benefits with regards to but not limited to the risk of reaction to medication, bleeding, infection, perforation and the patient is agreeable to proceed. Written consent to be obtained.  Patient is unable to read.  We have had his fiance come in for scheduling/instructions who will be able to help assist with instructions for appropriate bowel prep.

## 2018-03-27 NOTE — Progress Notes (Signed)
Primary Care Physician:  Terald Sleeper, PA-C  Primary Gastroenterologist:  Garfield Cornea, MD   Chief Complaint  Patient presents with  . Abdominal Pain    middle abd  . Consult    TCS  . Constipation    BM's are daily, not straining    HPI:  Bradley Fisher is a 52 y.o. male here to schedule first ever screening colonoscopy.  He was brought in due to concerns for constipation.  Patient is a very difficult historian.  He is also illiterate.  He does not have a bowel movement every single day but goes several times per week.  Reports [place] 4 stool most of the time.  He takes at least one hydrocodone daily for knee pain.  Denies any significant abdominal pain although this morning he says he has some mid abdominal pain after eating sausage and egg biscuit he prepared at home.  His heartburn is well controlled on omeprazole.  He denies dysphagia.  No melena or rectal bleeding.  He is not aware of any family members with history of colon cancer.  He denies significant alcohol use.  He drinks some weekends but not all weekends.  Really would not quantify how much just says a few beers.  Current Outpatient Medications  Medication Sig Dispense Refill  . atorvastatin (LIPITOR) 10 MG tablet Take 1 tablet (10 mg total) daily by mouth. 90 tablet 3  . [START ON 03/28/2018] HYDROcodone-acetaminophen (NORCO) 10-325 MG tablet Take 1 tablet by mouth every 8 (eight) hours as needed. 30 tablet 0  . lisinopril (PRINIVIL,ZESTRIL) 10 MG tablet Take 1 tablet (10 mg total) by mouth daily. 90 tablet 3  . loratadine (CLARITIN) 10 MG tablet Take 1 tablet (10 mg total) by mouth daily. 90 tablet 1  . meloxicam (MOBIC) 15 MG tablet Take 1 tablet (15 mg total) by mouth daily. 90 tablet 3  . omeprazole (PRILOSEC) 20 MG capsule Take 1 Capsule by mouth 2 times a day BEFORE meals 180 capsule 3  . PROAIR HFA 108 (90 Base) MCG/ACT inhaler Inhale 2 puffs into the lungs every 6 (six) hours as needed for wheezing or shortness  of breath.     . sertraline (ZOLOFT) 100 MG tablet Take 1 tablet (100 mg total) by mouth daily. 90 tablet 3  . [START ON 04/26/2018] HYDROcodone-acetaminophen (NORCO) 10-325 MG tablet Take 1 tablet by mouth every 8 (eight) hours as needed. 30 tablet 0   No current facility-administered medications for this visit.     Allergies as of 03/27/2018  . (No Known Allergies)    Past Medical History:  Diagnosis Date  . Arthritis   . Asthma   . Depression   . GERD (gastroesophageal reflux disease)   . Hyperlipidemia   . Hypertension   . Seizures ([place])    unknow etiology; on meds for this; no seizures since on meds 10 years ago    Past Surgical History:  Procedure Laterality Date  . KNEE ARTHROSCOPY WITH MEDIAL MENISECTOMY Left 11/11/2016   Procedure: LEFT KNEE ARTHROSCOPY WITH MEDIAL MENISECTOMY;  Surgeon: Carole Civil, MD;  Location: [facility];  Service: Orthopedics;  Laterality: Left;    Family History  Problem Relation Age of Onset  . Hypertension Mother   . Diabetes Mother   . Hypertension Father   . Diabetes Father   . Diabetes Sister   . Colon cancer Neg Hx     Social History   Socioeconomic History  . Marital status: Single  Spouse name: Not on file  . Number of children: Not on file  . Years of education: Not on file  . Highest education level: Not on file  Occupational History  . Not on file  Social Needs  . Financial resource strain: Not on file  . Food insecurity:    Worry: Not on file    Inability: Not on file  . Transportation needs:    Medical: Not on file    Non-medical: Not on file  Tobacco Use  . Smoking status: Current Every Day Smoker    Types: Cigars  . Smokeless tobacco: Current User    Types: Snuff  Substance and Sexual Activity  . Alcohol use: Yes    Alcohol/week: 7.2 oz    Types: 12 Cans of beer per week    Comment: drinks some weekends.   . Drug use: No  . Sexual activity: Yes    Birth control/protection: None  Lifestyle  .  Physical activity:    Days per week: Not on file    Minutes per session: Not on file  . Stress: Not on file  Relationships  . Social connections:    Talks on phone: Not on file    Gets together: Not on file    Attends religious service: Not on file    Active member of club or organization: Not on file    Attends meetings of clubs or organizations: Not on file    Relationship status: Not on file  . Intimate partner violence:    Fear of current or ex partner: Not on file    Emotionally abused: Not on file    Physically abused: Not on file    Forced sexual activity: Not on file  Other Topics Concern  . Not on file  Social History Narrative  . Not on file      ROS:  General: Negative for anorexia, weight loss, fever, chills, fatigue, weakness. Eyes: Negative for vision changes.  ENT: Negative for hoarseness, difficulty swallowing , nasal congestion. CV: Negative for chest pain, angina, palpitations, dyspnea on exertion, peripheral edema.  Respiratory: Negative for dyspnea at rest, dyspnea on exertion, cough, sputum, wheezing.  GI: See history of present illness. GU:  Negative for dysuria, hematuria, urinary incontinence, urinary frequency, nocturnal urination.  MS: Negative low back pain.  Chronic knee pain Derm: Negative for rash or itching.  Neuro: Negative for weakness, abnormal sensation, seizure, frequent headaches, memory loss, confusion.  Psych: Negative for anxiety, depression, suicidal ideation, hallucinations.  Endo: Negative for unusual weight change.  Heme: Negative for bruising or bleeding. Allergy: Negative for rash or hives.    Physical Examination:  BP (!) 149/88   Pulse (!) 55   Temp (!) 97 F (36.1 C) (Oral)   Ht 5\' 11"  (1.803 m)   Wt 227 lb 12.8 oz (103.3 kg)   BMI 31.77 kg/m    General: Well-nourished, well-developed in no acute distress.  Head: Normocephalic, atraumatic.   Eyes: Conjunctiva pink, no icterus. Mouth: Oropharyngeal mucosa moist  and pink , no lesions erythema or exudate.  Edentulous. Neck: Supple without thyromegaly, masses, or lymphadenopathy.  Lungs: Clear to auscultation bilaterally.  Heart: Regular rate and rhythm, no murmurs rubs or gallops.  Abdomen: Bowel sounds are normal,nondistended, nontender, no hepatosplenomegaly or masses, no abdominal bruits or    hernia , no rebound or guarding.   Rectal: Not performed Extremities: No lower extremity edema. No clubbing or deformities.  Neuro: Alert and oriented x 4 ,  grossly normal neurologically.  Skin: Warm and dry, no rash or jaundice.   Psych: Alert and cooperative, normal mood and affect.  Labs: Lab Results  Component Value Date   CREATININE 0.77 02/27/2018   BUN 15 02/27/2018   NA 141 02/27/2018   K 4.2 02/27/2018   CL 100 02/27/2018   CO2 24 02/27/2018   Lab Results  Component Value Date   ALT 19 02/27/2018   AST 29 02/27/2018   ALKPHOS 102 02/27/2018   BILITOT 0.4 02/27/2018   Lab Results  Component Value Date   WBC 6.6 02/27/2018   HGB 14.4 02/27/2018   HCT 39.8 02/27/2018   MCV 85 02/27/2018   PLT 191 02/27/2018   Lab Results  Component Value Date   TSH 0.893 08/17/2017     Imaging Studies: No results found.

## 2018-04-03 ENCOUNTER — Telehealth: Payer: Self-pay | Admitting: Physician Assistant

## 2018-04-03 NOTE — Telephone Encounter (Signed)
Patient states that he is supposed to get 90 of his pain medication not 30. Patient is just wanting to check on this and he is aware you are out of the office until Thursday.

## 2018-04-04 ENCOUNTER — Other Ambulatory Visit: Payer: Self-pay | Admitting: Physician Assistant

## 2018-04-04 DIAGNOSIS — M1990 Unspecified osteoarthritis, unspecified site: Secondary | ICD-10-CM

## 2018-04-04 MED ORDER — HYDROCODONE-ACETAMINOPHEN 10-325 MG PO TABS: 1.0000 | ORAL_TABLET | Freq: Three times a day (TID) | ORAL | 0 refills | Status: AC | PRN

## 2018-04-04 MED ORDER — HYDROCODONE-ACETAMINOPHEN 10-325 MG PO TABS: 1.0000 | ORAL_TABLET | Freq: Three times a day (TID) | ORAL | 0 refills | Status: DC | PRN

## 2018-04-04 NOTE — Telephone Encounter (Signed)
Should have been 81, sent replacement scripts

## 2018-04-07 ENCOUNTER — Telehealth: Payer: Self-pay | Admitting: Physician Assistant

## 2018-04-07 NOTE — Telephone Encounter (Signed)
Patient aware.

## 2018-04-07 NOTE — Telephone Encounter (Signed)
See Call from 04/03/2018

## 2018-04-30 ENCOUNTER — Other Ambulatory Visit: Payer: Self-pay | Admitting: Physician Assistant

## 2018-04-30 DIAGNOSIS — M545 Low back pain: Secondary | ICD-10-CM

## 2018-04-30 DIAGNOSIS — G8929 Other chronic pain: Secondary | ICD-10-CM

## 2018-04-30 DIAGNOSIS — M159 Polyosteoarthritis, unspecified: Secondary | ICD-10-CM

## 2018-05-01 NOTE — Telephone Encounter (Signed)
Last seen 02/27/18  Bradley Fisher 

## 2018-05-08 ENCOUNTER — Other Ambulatory Visit: Payer: Self-pay | Admitting: Physician Assistant

## 2018-05-08 DIAGNOSIS — F339 Major depressive disorder, recurrent, unspecified: Secondary | ICD-10-CM

## 2018-05-18 NOTE — Patient Instructions (Signed)
Bradley Fisher  05/18/2018     @PREFPERIOPPHARMACY @   Your procedure is scheduled on  05/29/2018   Report to [facility] at  745   A.M.  Call this number if you have problems the morning of surgery:  [phone]   Remember:  No food after midnight.  You may drink clear liquids until (follow the instructions given to you by Dr Roseanne Kaufman office) .  Clear liquids allowed are:                    Water, Juice (non-citric and without pulp), Carbonated beverages, Clear Tea, Black Coffee only, Plain Jell-O only, Gatorade and Plain Popsicles only    Take these medicines the morning of surgery with A SIP OF WATER  Hydrocodone, lisinopril, claritin, mobic, prilosec, zoloft. Use your inhaler before you come.    Do not wear jewelry, make-up or nail polish.  Do not wear lotions, powders, or perfumes, or deodorant.  Do not shave 48 hours prior to surgery.  Men may shave face and neck.  Do not bring valuables to the hospital.  [facility] is not responsible for any belongings or valuables.  Contacts, dentures or bridgework may not be worn into surgery.  Leave your suitcase in the car.  After surgery it may be brought to your room.  For patients admitted to the hospital, discharge time will be determined by your treatment team.  Patients discharged the day of surgery will not be allowed to drive home.   Name and phone number of your driver:   family Special instructions:  Follow the diet and prep instructions given to you by Dr Roseanne Kaufman office.  Please read over the following fact sheets that you were given. Anesthesia Post-op Instructions and Care and Recovery After Surgery       Colonoscopy, Adult A colonoscopy is an exam to look at the large intestine. It is done to check for problems, such as:  Lumps (tumors).  Growths (polyps).  Swelling (inflammation).  Bleeding.  What happens before the procedure? Eating and drinking Follow instructions from your doctor about  eating and drinking. These instructions may include:  A few days before the procedure - follow a low-fiber diet. ? Avoid nuts. ? Avoid seeds. ? Avoid dried fruit. ? Avoid raw fruits. ? Avoid vegetables.  1-3 days before the procedure - follow a clear liquid diet. Avoid liquids that have red or purple dye. Drink only clear liquids, such as: ? Clear broth or bouillon. ? Black coffee or tea. ? Clear juice. ? Clear soft drinks or sports drinks. ? Gelatin dessert. ? Popsicles.  On the day of the procedure - do not eat or drink anything during the 2 hours before the procedure.  Bowel prep If you were prescribed an oral bowel prep:  Take it as told by your doctor. Starting the day before your procedure, you will need to drink a lot of liquid. The liquid will cause you to poop (have bowel movements) until your poop is almost clear or light green.  If your skin or butt gets irritated from diarrhea, you may: ? Wipe the area with wipes that have medicine in them, such as adult wet wipes with aloe and vitamin E. ? Put something on your skin that soothes the area, such as petroleum jelly.  If you throw up (vomit) while drinking the bowel prep, take a break for up to 60 minutes. Then  begin the bowel prep again. If you keep throwing up and you cannot take the bowel prep without throwing up, call your doctor.  General instructions  Ask your doctor about changing or stopping your normal medicines. This is important if you take diabetes medicines or blood thinners.  Plan to have someone take you home from the hospital or clinic. What happens during the procedure?  An IV tube may be put into one of your veins.  You will be given medicine to help you relax (sedative).  To reduce your risk of infection: ? Your doctors will wash their hands. ? Your anal area will be washed with soap.  You will be asked to lie on your side with your knees bent.  Your doctor will get a long, thin, flexible  tube ready. The tube will have a camera and a light on the end.  The tube will be put into your anus.  The tube will be gently put into your large intestine.  Air will be delivered into your large intestine to keep it open. You may feel some pressure or cramping.  The camera will be used to take photos.  A small tissue sample may be removed from your body to be looked at under a microscope (biopsy). If any possible problems are found, the tissue will be sent to a lab for testing.  If small growths are found, your doctor may remove them and have them checked for cancer.  The tube that was put into your anus will be slowly removed. The procedure may vary among doctors and hospitals. What happens after the procedure?  Your doctor will check on you often until the medicines you were given have worn off.  Do not drive for 24 hours after the procedure.  You may have a small amount of blood in your poop.  You may pass gas.  You may have mild cramps or bloating in your belly (abdomen).  It is up to you to get the results of your procedure. Ask your doctor, or the department performing the procedure, when your results will be ready. This information is not intended to replace advice given to you by your health care provider. Make sure you discuss any questions you have with your health care provider. Document Released: 01/01/2011 Document Revised: 09/29/2016 Document Reviewed: 02/10/2016 Elsevier Interactive Patient Education  2017 [employer]  Colonoscopy, Adult, Care After This sheet gives you information about how to care for yourself after your procedure. Your health care provider may also give you more specific instructions. If you have problems or questions, contact your health care provider. What can I expect after the procedure? After the procedure, it is common to have:  A small amount of blood in your stool for 24 hours after the procedure.  Some gas.  Mild abdominal  cramping or bloating.  Follow these instructions at home: General instructions   For the first 24 hours after the procedure: ? Do not drive or use machinery. ? Do not sign important documents. ? Do not drink alcohol. ? Do your regular daily activities at a slower pace than normal. ? Eat soft, easy-to-digest foods. ? Rest often.  Take over-the-counter or prescription medicines only as told by your health care provider.  It is up to you to get the results of your procedure. Ask your health care provider, or the department performing the procedure, when your results will be ready. Relieving cramping and bloating  Try walking around when you have cramps  or feel bloated.  Apply heat to your abdomen as told by your health care provider. Use a heat source that your health care provider recommends, such as a moist heat pack or a heating pad. ? Place a towel between your skin and the heat source. ? Leave the heat on for 20-30 minutes. ? Remove the heat if your skin turns bright red. This is especially important if you are unable to feel pain, heat, or cold. You may have a greater risk of getting burned. Eating and drinking  Drink enough fluid to keep your urine clear or pale yellow.  Resume your normal diet as instructed by your health care provider. Avoid heavy or fried foods that are hard to digest.  Avoid drinking alcohol for as long as instructed by your health care provider. Contact a health care provider if:  You have blood in your stool 2-3 days after the procedure. Get help right away if:  You have more than a small spotting of blood in your stool.  You pass large blood clots in your stool.  Your abdomen is swollen.  You have nausea or vomiting.  You have a fever.  You have increasing abdominal pain that is not relieved with medicine. This information is not intended to replace advice given to you by your health care provider. Make sure you discuss any questions you  have with your health care provider. Document Released: 07/13/2004 Document Revised: 08/23/2016 Document Reviewed: 02/10/2016 Elsevier Interactive Patient Education  2018 [place] Anesthesia is a term that refers to techniques, procedures, and medicines that help a person stay safe and comfortable during a medical procedure. Monitored anesthesia care, or sedation, is one type of anesthesia. Your anesthesia specialist may recommend sedation if you will be having a procedure that does not require you to be unconscious, such as:  Cataract surgery.  A dental procedure.  A biopsy.  A colonoscopy.  During the procedure, you may receive a medicine to help you relax (sedative). There are three levels of sedation:  Mild sedation. At this level, you may feel awake and relaxed. You will be able to follow directions.  Moderate sedation. At this level, you will be sleepy. You may not remember the procedure.  Deep sedation. At this level, you will be asleep. You will not remember the procedure.  The more medicine you are given, the deeper your level of sedation will be. Depending on how you respond to the procedure, the anesthesia specialist may change your level of sedation or the type of anesthesia to fit your needs. An anesthesia specialist will monitor you closely during the procedure. Let your health care provider know about:  Any allergies you have.  All medicines you are taking, including vitamins, herbs, eye drops, creams, and over-the-counter medicines.  Any use of steroids (by mouth or as a cream).  Any problems you or family members have had with sedatives and anesthetic medicines.  Any blood disorders you have.  Any surgeries you have had.  Any medical conditions you have, such as sleep apnea.  Whether you are pregnant or may be pregnant.  Any use of cigarettes, alcohol, or street drugs. What are the risks? Generally, this is a safe  procedure. However, problems may occur, including:  Getting too much medicine (oversedation).  Nausea.  Allergic reaction to medicines.  Trouble breathing. If this happens, a breathing tube may be used to help with breathing. It will be removed when you are awake and  breathing on your own.  Heart trouble.  Lung trouble.  Before the procedure Staying hydrated Follow instructions from your health care provider about hydration, which may include:  Up to 2 hours before the procedure - you may continue to drink clear liquids, such as water, clear fruit juice, black coffee, and plain tea.  Eating and drinking restrictions Follow instructions from your health care provider about eating and drinking, which may include:  8 hours before the procedure - stop eating heavy meals or foods such as meat, fried foods, or fatty foods.  6 hours before the procedure - stop eating light meals or foods, such as toast or cereal.  6 hours before the procedure - stop drinking milk or drinks that contain milk.  2 hours before the procedure - stop drinking clear liquids.  Medicines Ask your health care provider about:  Changing or stopping your regular medicines. This is especially important if you are taking diabetes medicines or blood thinners.  Taking medicines such as aspirin and ibuprofen. These medicines can thin your blood. Do not take these medicines before your procedure if your health care provider instructs you not to.  Tests and exams  You will have a physical exam.  You may have blood tests done to show: ? How well your kidneys and liver are working. ? How well your blood can clot.  General instructions  Plan to have someone take you home from the hospital or clinic.  If you will be going home right after the procedure, plan to have someone with you for 24 hours.  What happens during the procedure?  Your blood pressure, heart rate, breathing, level of pain and overall  condition will be monitored.  An IV tube will be inserted into one of your veins.  Your anesthesia specialist will give you medicines as needed to keep you comfortable during the procedure. This may mean changing the level of sedation.  The procedure will be performed. After the procedure  Your blood pressure, heart rate, breathing rate, and blood oxygen level will be monitored until the medicines you were given have worn off.  Do not drive for 24 hours if you received a sedative.  You may: ? Feel sleepy, clumsy, or nauseous. ? Feel forgetful about what happened after the procedure. ? Have a sore throat if you had a breathing tube during the procedure. ? Vomit. This information is not intended to replace advice given to you by your health care provider. Make sure you discuss any questions you have with your health care provider. Document Released: 08/25/2005 Document Revised: 05/07/2016 Document Reviewed: 03/21/2016 Elsevier Interactive Patient Education  2018 [place], Care After These instructions provide you with information about caring for yourself after your procedure. Your health care provider may also give you more specific instructions. Your treatment has been planned according to current medical practices, but problems sometimes occur. Call your health care provider if you have any problems or questions after your procedure. What can I expect after the procedure? After your procedure, it is common to:  Feel sleepy for several hours.  Feel clumsy and have poor balance for several hours.  Feel forgetful about what happened after the procedure.  Have poor judgment for several hours.  Feel nauseous or vomit.  Have a sore throat if you had a breathing tube during the procedure.  Follow these instructions at home: For at least 24 hours after the procedure:   Do not: ? Participate in activities  in which you could fall or become  injured. ? Drive. ? Use heavy machinery. ? Drink alcohol. ? Take sleeping pills or medicines that cause drowsiness. ? Make important decisions or sign legal documents. ? Take care of children on your own.  Rest. Eating and drinking  Follow the diet that is recommended by your health care provider.  If you vomit, drink water, juice, or soup when you can drink without vomiting.  Make sure you have little or no nausea before eating solid foods. General instructions  Have a responsible adult stay with you until you are awake and alert.  Take over-the-counter and prescription medicines only as told by your health care provider.  If you smoke, do not smoke without supervision.  Keep all follow-up visits as told by your health care provider. This is important. Contact a health care provider if:  You keep feeling nauseous or you keep vomiting.  You feel light-headed.  You develop a rash.  You have a fever. Get help right away if:  You have trouble breathing. This information is not intended to replace advice given to you by your health care provider. Make sure you discuss any questions you have with your health care provider. Document Released: 03/21/2016 Document Revised: 07/21/2016 Document Reviewed: 03/21/2016 Elsevier Interactive Patient Education  [employer].

## 2018-05-22 ENCOUNTER — Ambulatory Visit (INDEPENDENT_AMBULATORY_CARE_PROVIDER_SITE_OTHER): Payer: Medicare Other | Admitting: Orthopedic Surgery

## 2018-05-22 ENCOUNTER — Ambulatory Visit (INDEPENDENT_AMBULATORY_CARE_PROVIDER_SITE_OTHER): Payer: Medicare Other

## 2018-05-22 ENCOUNTER — Encounter: Payer: Self-pay | Admitting: Orthopedic Surgery

## 2018-05-22 ENCOUNTER — Ambulatory Visit: Payer: Medicare Other

## 2018-05-22 VITALS — BP 166/87 | HR 51 | Ht 72.0 in | Wt 216.0 lb

## 2018-05-22 DIAGNOSIS — M25572 Pain in left ankle and joints of left foot: Secondary | ICD-10-CM | POA: Diagnosis not present

## 2018-05-22 DIAGNOSIS — S93602A Unspecified sprain of left foot, initial encounter: Secondary | ICD-10-CM

## 2018-05-22 DIAGNOSIS — M79671 Pain in right foot: Secondary | ICD-10-CM | POA: Diagnosis not present

## 2018-05-22 DIAGNOSIS — M79672 Pain in left foot: Secondary | ICD-10-CM | POA: Diagnosis not present

## 2018-05-22 DIAGNOSIS — Z9889 Other specified postprocedural states: Secondary | ICD-10-CM | POA: Diagnosis not present

## 2018-05-22 NOTE — Progress Notes (Signed)
Progress Note   Patient ID: Bradley Fisher, male   DOB: 1966-12-10, 52 y.o.   MRN: [id number] Chief Complaint  Patient presents with  . Ankle Injury    stepped in a hole  . Foot Pain    left ankle and left foot pain     Medical decision-making  Imaging:  Xrays ordered: y/n ? YES  See the dictated report x-ray of the ankle x-ray of the foot x-ray of the ankle normal with some unexplained obliquity of the ankle joint chronic and then midfoot show some mild arthritis as is the great toe but no fracture no acute injury in either's place.  Encounter Diagnoses  Name Primary?  . S/P arthroscopy of left knee 11/11/2016 Yes  . Acute left ankle pain   . Acute foot pain, right   . Acute foot pain, left   . Sprain of left foot, initial encounter      PLAN:  This is self resolving patient will follow-up with [place] as needed should resolve in 5-6 more weeks  FURTHER WORKUP: NO  No orders of the defined types were placed in this encounter.   Chief Complaint  Patient presents with  . Ankle Injury    stepped in a hole  . Foot Pain    left ankle and left foot pain    52 year old male had a knee scope presents with pain in his foot and ankle after stepping in a hole 3 weeks ago.  Complains of dull mild dorsal foot pain associated with some swelling and painful weightbearing, no instability    Review of Systems  Musculoskeletal: Positive for falls and joint pain.  Skin:       Abrasion over the area of pain left foot  Neurological: Negative for tingling.   Current Meds  Medication Sig  . atorvastatin (LIPITOR) 10 MG tablet Take 1 tablet (10 mg total) daily by mouth.  Marland Kitchen CLENPIQ 10-3.5-12 MG-GM -GM/160ML SOLN Take 1 kit by mouth once. Prior to colonoscopy.  Marland Kitchen HYDROcodone-acetaminophen (NORCO) 10-325 MG tablet Take 1 tablet by mouth every 8 (eight) hours as needed. (Patient taking differently: Take 1 tablet by mouth every 8 (eight) hours as needed for moderate pain. )  . linaclotide  (LINZESS) 145 MCG CAPS capsule Take 145 mcg by mouth daily before breakfast.  . lisinopril (PRINIVIL,ZESTRIL) 10 MG tablet Take 1 tablet (10 mg total) by mouth daily.  Marland Kitchen loratadine (CLARITIN) 10 MG tablet Take 1 tablet (10 mg total) by mouth daily.  . meloxicam (MOBIC) 15 MG tablet TAKE 1 TABLET BY MOUTH ONCE DAILY  . omeprazole (PRILOSEC) 20 MG capsule Take 1 Capsule by mouth 2 times a day BEFORE meals (Patient taking differently: Take 20 mg by mouth See admin instructions. Takes 55m every morning. Takes 241min the evening only if needed for heartburn.)  . PROAIR HFA 108 (90 Base) MCG/ACT inhaler Inhale 2 puffs into the lungs every 6 (six) hours as needed for wheezing or shortness of breath.   . sertraline (ZOLOFT) 100 MG tablet TAKE 1 TABLET BY MOUTH ONCE DAILY    Past Medical History:  Diagnosis Date  . Arthritis   . Asthma   . Depression   . GERD (gastroesophageal reflux disease)   . Hyperlipidemia   . Hypertension   . [place]; on meds for this; no seizures since on meds 10 years ago     No Known Allergies  BP (!) 166/87   Pulse (!Marland Kitchen  51   Ht 6' (1.829 m)   Wt 216 lb (98 kg)   BMI 29.29 kg/m    Physical Exam  Constitutional: He is oriented to person, place, and time. He appears well-developed and well-nourished.  Vital signs have been reviewed and are stable. Gen. appearance the patient is well-developed and well-nourished with normal grooming and hygiene.   Neurological: He is alert and oriented to person, place, and time.  Skin: Skin is warm and dry. No erythema.  Psychiatric: He has a normal mood and affect.  Vitals reviewed.   Ortho Exam  There is no appreciable limp when he is walking  His left foot alignment looks normal.  He has tenderness at the joint between the talus and midfoot there is an abrasion of the skin in that area there is no instability on stress testing is a good pulse normal sensation  His ankle joint is stable he has  normal range of motion of the ankle  In comparison the opposite foot and ankle exam shows normal alignment range of motion stability strength without atrophy skin normal pulse and perfusion normal.   Arther Abbott, MD  10:31 AM 05/22/2018

## 2018-05-23 ENCOUNTER — Other Ambulatory Visit: Payer: Self-pay

## 2018-05-23 ENCOUNTER — Encounter (HOSPITAL_COMMUNITY): Payer: Self-pay

## 2018-05-23 ENCOUNTER — Encounter (HOSPITAL_COMMUNITY)
Admission: RE | Admit: 2018-05-23 | Discharge: 2018-05-23 | Disposition: A | Discharge status: Home / Self Care | Payer: Medicare Other | Source: Ambulatory Visit | Attending: Internal Medicine | Admitting: Internal Medicine

## 2018-05-23 DIAGNOSIS — Z01810 Encounter for preprocedural cardiovascular examination: Secondary | ICD-10-CM | POA: Diagnosis present

## 2018-05-24 ENCOUNTER — Ambulatory Visit (INDEPENDENT_AMBULATORY_CARE_PROVIDER_SITE_OTHER): Payer: Medicare Other | Admitting: Physician Assistant

## 2018-05-24 ENCOUNTER — Encounter: Payer: Self-pay | Admitting: Physician Assistant

## 2018-05-24 VITALS — BP 140/81 | HR 51 | Temp 96.9°F | Ht 72.0 in | Wt 216.2 lb

## 2018-05-24 DIAGNOSIS — M1990 Unspecified osteoarthritis, unspecified site: Secondary | ICD-10-CM

## 2018-05-24 DIAGNOSIS — M159 Polyosteoarthritis, unspecified: Secondary | ICD-10-CM | POA: Diagnosis not present

## 2018-05-24 DIAGNOSIS — I10 Essential (primary) hypertension: Secondary | ICD-10-CM | POA: Diagnosis not present

## 2018-05-24 MED ORDER — HYDROCODONE-ACETAMINOPHEN 10-325 MG PO TABS: 1.0000 | ORAL_TABLET | Freq: Three times a day (TID) | ORAL | 0 refills | Status: AC | PRN

## 2018-05-24 MED ORDER — HYDROCODONE-ACETAMINOPHEN 10-325 MG PO TABS: 1.0000 | ORAL_TABLET | Freq: Three times a day (TID) | ORAL | 0 refills | Status: DC | PRN

## 2018-05-26 NOTE — Progress Notes (Signed)
BP 140/81   Pulse (!) 51   Temp (!) 96.9 F (36.1 C) (Oral)   Ht 6' (1.829 m)   Wt 216 lb 3.2 oz (98.1 kg)   BMI 29.32 kg/m    Subjective:    Patient ID: Bradley Fisher, male    DOB: 02-Aug-1966, 52 y.o.   MRN: [id number]  HPI: Bradley Fisher is a 52 y.o. male presenting on 05/24/2018 for Hypertension (three month recheck); Gastroesophageal Reflux; Hyperlipidemia; and pain management (refill)  This patient comes in for periodic recheck on medications and conditions including arthritis, hypertension, generalized OA. Marland Kitchen He reports that he has been doing very well the last few months.  He is seeing an orthopedist.  They are discussing possible surgery for his knee.  He is trying to hold out as long as possible.  Otherwise he is doing well with his medications and labs will be performed.  Patient  All medications are reviewed today. There are no reports of any problems with the medications. All of the medical conditions are reviewed and updated.  Lab work is reviewed and will be ordered as medically necessary. There are no new problems reported with today's visit.   Past Medical History:  Diagnosis Date  . Arthritis   . Asthma   . Depression   . GERD (gastroesophageal reflux disease)   . Hyperlipidemia   . Hypertension   . Seizures ([place])    unknow etiology; on meds for this; no seizures since on meds 10 years ago   Relevant past medical, surgical, family and social history reviewed and updated as indicated. Interim medical history since our last visit reviewed. Allergies and medications reviewed and updated. DATA REVIEWED: CHART IN EPIC  Family History reviewed for pertinent findings.  Review of Systems  Constitutional: Negative.  Negative for appetite change and fatigue.  Eyes: Negative for pain and visual disturbance.  Respiratory: Negative.  Negative for cough, chest tightness, shortness of breath and wheezing.   Cardiovascular: Negative.  Negative for chest pain,  palpitations and leg swelling.  Gastrointestinal: Negative.  Negative for abdominal pain, diarrhea, nausea and vomiting.  Genitourinary: Negative.   Musculoskeletal: Positive for arthralgias, back pain and joint swelling.  Skin: Negative.  Negative for color change and rash.  Neurological: Negative.  Negative for weakness, numbness and headaches.  Psychiatric/Behavioral: Negative.     Allergies as of 05/24/2018   No Known Allergies     Medication List        Accurate as of 05/24/18 11:59 PM. Always use your most recent med list.          atorvastatin 10 MG tablet Commonly known as:  LIPITOR Take 1 tablet (10 mg total) daily by mouth.   CLENPIQ 10-3.5-12 MG-GM -GM/160ML Soln Generic drug:  Sod Picosulfate-Mag Ox-Cit Acd Take 1 kit by mouth once. Prior to colonoscopy.   HYDROcodone-acetaminophen 10-325 MG tablet Commonly known as:  NORCO Take 1 tablet by mouth every 8 (eight) hours as needed for moderate pain.   HYDROcodone-acetaminophen 10-325 MG tablet Commonly known as:  NORCO Take 1 tablet by mouth every 8 (eight) hours as needed.   HYDROcodone-acetaminophen 10-325 MG tablet Commonly known as:  NORCO Take 1 tablet by mouth every 8 (eight) hours as needed.   linaclotide 145 MCG Caps capsule Commonly known as:  LINZESS Take 145 mcg by mouth daily before breakfast.   lisinopril 10 MG tablet Commonly known as:  PRINIVIL,ZESTRIL Take 1 tablet (10 mg total) by mouth daily.  loratadine 10 MG tablet Commonly known as:  CLARITIN Take 1 tablet (10 mg total) by mouth daily.   meloxicam 15 MG tablet Commonly known as:  MOBIC TAKE 1 TABLET BY MOUTH ONCE DAILY   omeprazole 20 MG capsule Commonly known as:  PRILOSEC Take 1 Capsule by mouth 2 times a day BEFORE meals   PROAIR HFA 108 (90 Base) MCG/ACT inhaler Generic drug:  albuterol Inhale 2 puffs into the lungs every 6 (six) hours as needed for wheezing or shortness of breath.   sertraline 100 MG tablet Commonly  known as:  ZOLOFT TAKE 1 TABLET BY MOUTH ONCE DAILY          Objective:    BP 140/81   Pulse (!) 51   Temp (!) 96.9 F (36.1 C) (Oral)   Ht 6' (1.829 m)   Wt 216 lb 3.2 oz (98.1 kg)   BMI 29.32 kg/m   No Known Allergies  Wt Readings from Last 3 Encounters:  05/24/18 216 lb 3.2 oz (98.1 kg)  05/23/18 217 lb (98.4 kg)  05/22/18 216 lb (98 kg)    Physical Exam  Constitutional: He appears well-developed and well-nourished. No distress.  HENT:  Head: Normocephalic and atraumatic.  Eyes: Pupils are equal, round, and reactive to light. Conjunctivae and EOM are normal.  Cardiovascular: Normal rate, regular rhythm and normal heart sounds.  Pulmonary/Chest: Effort normal and breath sounds normal. No respiratory distress.  Musculoskeletal:       Right knee: He exhibits decreased range of motion and swelling.       Left knee: He exhibits decreased range of motion and swelling.  Skin: Skin is warm and dry.  Psychiatric: He has a normal mood and affect. His behavior is normal.  Nursing note and vitals reviewed.   Results for orders placed or performed in visit on 02/27/18  CBC with Differential/Platelet  Result Value Ref Range   WBC 6.6 3.4 - 10.8 x10E3/uL   RBC 4.66 4.14 - 5.80 x10E6/uL   Hemoglobin 14.4 13.0 - 17.7 g/dL   Hematocrit 39.8 37.5 - 51.0 %   MCV 85 79 - 97 fL   MCH 30.9 26.6 - 33.0 pg   MCHC 36.2 (H) 31.5 - 35.7 g/dL   RDW 13.5 12.3 - 15.4 %   Platelets 191 150 - 379 x10E3/uL   Neutrophils 60 Not Estab. %   Lymphs 27 Not Estab. %   Monocytes 7 Not Estab. %   Eos 5 Not Estab. %   Basos 1 Not Estab. %   Neutrophils Absolute 4.0 1.4 - 7.0 x10E3/uL   Lymphocytes Absolute 1.8 0.7 - 3.1 x10E3/uL   Monocytes Absolute 0.4 0.1 - 0.9 x10E3/uL   EOS (ABSOLUTE) 0.3 0.0 - 0.4 x10E3/uL   Basophils Absolute 0.1 0.0 - 0.2 x10E3/uL   Immature Granulocytes 0 Not Estab. %   Immature Grans (Abs) 0.0 0.0 - 0.1 x10E3/uL  CMP14+EGFR  Result Value Ref Range   Glucose 95 65 -  99 mg/dL   BUN 15 6 - 24 mg/dL   Creatinine, Ser 0.77 0.76 - 1.27 mg/dL   GFR calc non Af Amer 104 >59 mL/min/1.73   GFR calc Af Amer 121 >59 mL/min/1.73   BUN/Creatinine Ratio 19 9 - 20   Sodium 141 134 - 144 mmol/L   Potassium 4.2 3.5 - 5.2 mmol/L   Chloride 100 96 - 106 mmol/L   CO2 24 20 - 29 mmol/L   Calcium 9.7 8.7 - 10.2 mg/dL  Total Protein 7.1 6.0 - 8.5 g/dL   Albumin 4.6 3.5 - 5.5 g/dL   Globulin, Total 2.5 1.5 - 4.5 g/dL   Albumin/Globulin Ratio 1.8 1.2 - 2.2   Bilirubin Total 0.4 0.0 - 1.2 mg/dL   Alkaline Phosphatase 102 39 - 117 IU/L   AST 29 0 - 40 IU/L   ALT 19 0 - 44 IU/L  Lipid panel  Result Value Ref Range   Cholesterol, Total 150 100 - 199 mg/dL   Triglycerides 158 (H) 0 - 149 mg/dL   HDL 44 >39 mg/dL   VLDL Cholesterol Cal 32 5 - 40 mg/dL   LDL Calculated 74 0 - 99 mg/dL   Chol/HDL Ratio 3.4 0.0 - 5.0 ratio  PSA  Result Value Ref Range   Prostate Specific Ag, Serum 0.2 0.0 - 4.0 ng/mL      Assessment & Plan:   1. Arthritis - HYDROcodone-acetaminophen (NORCO) 10-325 MG tablet; Take 1 tablet by mouth every 8 (eight) hours as needed for moderate pain.  Dispense: 90 tablet; Refill: 0 - HYDROcodone-acetaminophen (NORCO) 10-325 MG tablet; Take 1 tablet by mouth every 8 (eight) hours as needed.  Dispense: 90 tablet; Refill: 0 - HYDROcodone-acetaminophen (NORCO) 10-325 MG tablet; Take 1 tablet by mouth every 8 (eight) hours as needed.  Dispense: 90 tablet; Refill: 0  2. Hypertension, essential Continue lisinopril   3. Generalized OA Next no fevers no chills no chest pain continue Mobic   Continue all other maintenance medications as listed above.  Follow up plan: Return in about 3 months (around 08/24/2018) for recheck.  Educational handout given for [place] PA-C [place] [address]  [place], [place] [address] [phone]   05/26/2018, 11:33 AM

## 2018-05-28 ENCOUNTER — Other Ambulatory Visit: Payer: Self-pay | Admitting: Physician Assistant

## 2018-05-28 DIAGNOSIS — J301 Allergic rhinitis due to pollen: Secondary | ICD-10-CM

## 2018-05-29 ENCOUNTER — Ambulatory Visit (HOSPITAL_COMMUNITY): Payer: Medicare Other | Admitting: Anesthesiology

## 2018-05-29 ENCOUNTER — Encounter (HOSPITAL_COMMUNITY): Payer: Self-pay

## 2018-05-29 ENCOUNTER — Encounter (HOSPITAL_COMMUNITY)
Admission: RE | Disposition: A | Discharge status: Home / Self Care | Payer: Self-pay | Source: Ambulatory Visit | Attending: Internal Medicine

## 2018-05-29 ENCOUNTER — Ambulatory Visit (HOSPITAL_COMMUNITY)
Admission: RE | Admit: 2018-05-29 | Discharge: 2018-05-29 | Disposition: A | Discharge status: Home / Self Care | Payer: Medicare Other | Source: Ambulatory Visit | Attending: Internal Medicine | Admitting: Internal Medicine

## 2018-05-29 DIAGNOSIS — K640 First degree hemorrhoids: Secondary | ICD-10-CM | POA: Insufficient documentation

## 2018-05-29 DIAGNOSIS — Z791 Long term (current) use of non-steroidal anti-inflammatories (NSAID): Secondary | ICD-10-CM | POA: Diagnosis not present

## 2018-05-29 DIAGNOSIS — Z1211 Encounter for screening for malignant neoplasm of colon: Secondary | ICD-10-CM | POA: Diagnosis not present

## 2018-05-29 DIAGNOSIS — I10 Essential (primary) hypertension: Secondary | ICD-10-CM | POA: Diagnosis not present

## 2018-05-29 DIAGNOSIS — F17290 Nicotine dependence, other tobacco product, uncomplicated: Secondary | ICD-10-CM | POA: Diagnosis not present

## 2018-05-29 DIAGNOSIS — F329 Major depressive disorder, single episode, unspecified: Secondary | ICD-10-CM | POA: Insufficient documentation

## 2018-05-29 DIAGNOSIS — J45909 Unspecified asthma, uncomplicated: Secondary | ICD-10-CM | POA: Diagnosis not present

## 2018-05-29 DIAGNOSIS — K219 Gastro-esophageal reflux disease without esophagitis: Secondary | ICD-10-CM | POA: Insufficient documentation

## 2018-05-29 DIAGNOSIS — E785 Hyperlipidemia, unspecified: Secondary | ICD-10-CM | POA: Insufficient documentation

## 2018-05-29 DIAGNOSIS — Z79899 Other long term (current) drug therapy: Secondary | ICD-10-CM | POA: Diagnosis not present

## 2018-05-29 DIAGNOSIS — D122 Benign neoplasm of ascending colon: Secondary | ICD-10-CM | POA: Insufficient documentation

## 2018-05-29 HISTORY — PX: COLONOSCOPY WITH PROPOFOL: SHX5780

## 2018-05-29 SURGERY — COLONOSCOPY WITH PROPOFOL
Anesthesia: General

## 2018-05-29 MED ORDER — LACTATED RINGERS IV SOLN
INTRAVENOUS | Status: DC
  Administered 2018-05-29: 09:00:00 via INTRAVENOUS

## 2018-05-29 MED ORDER — CHLORHEXIDINE GLUCONATE CLOTH 2 % EX PADS: 6.0000 | MEDICATED_PAD | Freq: Once | CUTANEOUS | Status: DC

## 2018-05-29 MED ORDER — FENTANYL CITRATE (PF) 100 MCG/2ML IJ SOLN: 25.0000 ug | INTRAMUSCULAR | Status: DC | PRN

## 2018-05-29 MED ORDER — MIDAZOLAM HCL 2 MG/2ML IJ SOLN
INTRAMUSCULAR | Status: AC
  Filled 2018-05-29: qty 2

## 2018-05-29 MED ORDER — MIDAZOLAM HCL 5 MG/5ML IJ SOLN
INTRAMUSCULAR | Status: DC | PRN
  Administered 2018-05-29: 2 mg via INTRAVENOUS

## 2018-05-29 MED ORDER — GLYCOPYRROLATE 0.2 MG/ML IJ SOLN
INTRAMUSCULAR | Status: DC | PRN
  Administered 2018-05-29 (×2): 0.2 mg via INTRAVENOUS

## 2018-05-29 MED ORDER — HYDROCODONE-ACETAMINOPHEN 7.5-325 MG PO TABS: 1.0000 | ORAL_TABLET | Freq: Once | ORAL | Status: DC | PRN

## 2018-05-29 MED ORDER — PROPOFOL 500 MG/50ML IV EMUL
INTRAVENOUS | Status: DC | PRN
  Administered 2018-05-29: 150 ug/kg/min via INTRAVENOUS
  Administered 2018-05-29: 10:00:00 via INTRAVENOUS

## 2018-05-29 MED ORDER — LIDOCAINE HCL (CARDIAC) PF 50 MG/5ML IV SOSY
PREFILLED_SYRINGE | INTRAVENOUS | Status: DC | PRN
  Administered 2018-05-29: 30 mg via INTRAVENOUS

## 2018-05-29 NOTE — Anesthesia Procedure Notes (Signed)
Procedure Name: MAC Date/Time: 05/29/2018 9:32 AM Performed by: Andree Elk Jakki Doughty A, CRNA Pre-anesthesia Checklist: Emergency Drugs available, Patient identified, Suction available, Patient being monitored and Timeout performed Oxygen Delivery Method: Simple face mask

## 2018-05-29 NOTE — Discharge Instructions (Signed)
Colon Polyps °Polyps are tissue growths inside the body. Polyps can grow in many places, including the large intestine (colon). A polyp may be a round bump or a mushroom-shaped growth. You could have one polyp or several. °Most colon polyps are noncancerous (benign). However, some colon polyps can become cancerous over time. °What are the causes? °The exact cause of colon polyps is not known. °What increases the risk? °This condition is more likely to develop in people who: °· Have a family history of colon cancer or colon polyps. °· Are older than 50 or older than 45 if they are African American. °· Have inflammatory bowel disease, such as ulcerative colitis or Crohn disease. °· Are overweight. °· Smoke cigarettes. °· Do not get enough exercise. °· Drink too much alcohol. °· Eat a diet that is: °? High in fat and red meat. °? Low in fiber. °· Had childhood cancer that was treated with abdominal radiation. ° °What are the signs or symptoms? °Most polyps do not cause symptoms. If you have symptoms, they may include: °· Blood coming from your rectum when having a bowel movement. °· Blood in your stool. The stool may look dark red or black. °· A change in bowel habits, such as constipation or diarrhea. ° °How is this diagnosed? °This condition is diagnosed with a colonoscopy. This is a procedure that uses a lighted, flexible scope to look at the inside of your colon. °How is this treated? °Treatment for this condition involves removing any polyps that are found. Those polyps will then be tested for cancer. If cancer is found, your health care provider will talk to you about options for colon cancer treatment. °Follow these instructions at home: °Diet °· Eat plenty of fiber, such as fruits, vegetables, and whole grains. °· Eat foods that are high in calcium and vitamin D, such as milk, cheese, yogurt, eggs, liver, fish, and broccoli. °· Limit foods high in fat, red meats, and processed meats, such as hot dogs, sausage,  bacon, and lunch meats. °· Maintain a healthy weight, or lose weight if recommended by your health care provider. °General instructions °· Do not smoke cigarettes. °· Do not drink alcohol excessively. °· Keep all follow-up visits as told by your health care provider. This is important. This includes keeping regularly scheduled colonoscopies. Talk to your health care provider about when you need a colonoscopy. °· Exercise every day or as told by your health care provider. °Contact a health care provider if: °· You have new or worsening bleeding during a bowel movement. °· You have new or increased blood in your stool. °· You have a change in bowel habits. °· You unexpectedly lose weight. °This information is not intended to replace advice given to you by your health care provider. Make sure you discuss any questions you have with your health care provider. °Document Released: 08/25/2004 Document Revised: 05/06/2016 Document Reviewed: 10/20/2015 °Elsevier Interactive Patient Education © 2018 [employer] ° °Colonoscopy °Discharge Instructions ° °Read the instructions outlined below and refer to this sheet in the next few weeks. These discharge instructions provide you with general information on caring for yourself after you leave the hospital. Your doctor may also give you specific instructions. While your treatment has been planned according to the most current medical practices available, unavoidable complications occasionally occur. If you have any problems or questions after discharge, call Dr. Rourk at [phone]. °ACTIVITY °· You may resume your regular activity, but move at a slower pace for the next 24   hours.  °· Take frequent rest periods for the next 24 hours.  °· Walking will help get rid of the air and reduce the bloated feeling in your belly (abdomen).  °· No driving for 24 hours (because of the medicine (anesthesia) used during the test).   °· Do not sign any important legal documents or operate any  machinery for 24 hours (because of the anesthesia used during the test).  °NUTRITION °· Drink plenty of fluids.  °· You may resume your normal diet as instructed by your doctor.  °· Begin with a light meal and progress to your normal diet. Heavy or fried foods are harder to digest and may make you feel sick to your stomach (nauseated).  °· Avoid alcoholic beverages for 24 hours or as instructed.  °MEDICATIONS °· You may resume your normal medications unless your doctor tells you otherwise.  °WHAT YOU CAN EXPECT TODAY °· Some feelings of bloating in the abdomen.  °· Passage of more gas than usual.  °· Spotting of blood in your stool or on the toilet paper.  °IF YOU HAD POLYPS REMOVED DURING THE COLONOSCOPY: °· No aspirin products for 7 days or as instructed.  °· No alcohol for 7 days or as instructed.  °· Eat a soft diet for the next 24 hours.  °FINDING OUT THE RESULTS OF YOUR TEST °Not all test results are available during your visit. If your test results are not back during the visit, make an appointment with your caregiver to find out the results. Do not assume everything is normal if you have not heard from your caregiver or the medical facility. It is important for you to follow up on all of your test results.  °SEEK IMMEDIATE MEDICAL ATTENTION IF: °· You have more than a spotting of blood in your stool.  °· Your belly is swollen (abdominal distention).  °· You are nauseated or vomiting.  °· You have a temperature over 101.  °· You have abdominal pain or discomfort that is severe or gets worse throughout the day.  ° ° ° °Colon polyp information provided ° °Further recommendations to follow pending review of pathology report °

## 2018-05-29 NOTE — Op Note (Signed)
[facility] Patient Name: Bradley Fisher Procedure Date: 05/29/2018 9:18 AM MRN: [id number] Date of Birth: [birth year] Attending MD: Norvel Richards , MD CSN: [id number] Age: 52 Admit Type: Outpatient Procedure:                Colonoscopy Indications:              Screening for colorectal malignant neoplasm Providers:                Norvel Richards, MD, Hinton Rao, RN, Aram Candela Referring MD:             Terald Sleeper Medicines:                Propofol per Anesthesia Complications:            No immediate complications. Estimated Blood Loss:     Estimated blood loss was minimal. Procedure:                Pre-Anesthesia Assessment:                           - Prior to the procedure, a History and Physical                            was performed, and patient medications and                            allergies were reviewed. The patient's tolerance of                            previous anesthesia was also reviewed. The risks                            and benefits of the procedure and the sedation                            options and risks were discussed with the patient.                            All questions were answered, and informed consent                            was obtained. Prior Anticoagulants: The patient has                            taken no previous anticoagulant or antiplatelet                            agents. After reviewing the risks and benefits, the                            patient was deemed in satisfactory condition to  undergo the procedure.                           After obtaining informed consent, the colonoscope                            was passed under direct vision. Throughout the                            procedure, the patient's blood pressure, pulse, and                            oxygen saturations were monitored continuously. The                             EC-[id number] ([id number]) scope was introduced through                            the and advanced to the the cecum, identified by                            appendiceal orifice and ileocecal valve. The                            colonoscopy was performed without difficulty. The                            patient tolerated the procedure well. The quality                            of the bowel preparation was adequate. The                            ileocecal valve, appendiceal orifice, and rectum                            were photographed. The colonoscopy was performed                            without difficulty. The patient tolerated the                            procedure well. The quality of the bowel                            preparation was adequate. Scope In: 9:40:46 AM Scope Out: 9:53:38 AM Scope Withdrawal Time: 0 hours 8 minutes 22 seconds  Total Procedure Duration: 0 hours 12 minutes 52 seconds  Findings:      The perianal and digital rectal examinations were normal.      A 7 mm polyp was found in the ascending colon. The polyp was       semi-pedunculated. The polyp was removed with a cold snare. Resection       and retrieval were complete. Estimated blood loss was minimal.      The  exam was otherwise without abnormality on direct and retroflexion       views.      Internal hemorrhoids were found during retroflexion. The hemorrhoids       were moderate, medium-sized and Grade I (internal hemorrhoids that do       not prolapse). Impression:               - One 7 mm polyp in the ascending colon, removed                            with a cold snare. Resected and retrieved.                           - The examination was otherwise normal on direct                            and retroflexion views.                           - Internal hemorrhoids. Moderate Sedation:      Moderate (conscious) sedation was personally administered by an       anesthesia professional. The following  parameters were monitored: oxygen       saturation, heart rate, blood pressure, respiratory rate, EKG, adequacy       of pulmonary ventilation, and response to care. Total physician       intraservice time was 18 minutes. Recommendation:           - Patient has a contact number available for                            emergencies. The signs and symptoms of potential                            delayed complications were discussed with the                            patient. Return to normal activities tomorrow.                            Written discharge instructions were provided to the                            patient.                           - Resume previous diet.                           - Continue present medications.                           - Repeat colonoscopy date to be determined after                            pending pathology results are reviewed for  surveillance based on pathology results.                           - Return to GI office (date not yet determined). Procedure Code(s):        --- Professional ---                           [phone], Colonoscopy, flexible; with removal of                            tumor(s), polyp(s), or other lesion(s) by snare                            technique Diagnosis Code(s):        --- Professional ---                           Z12.11, Encounter for screening for malignant                            neoplasm of colon                           D12.2, Benign neoplasm of ascending colon                           K64.0, First degree hemorrhoids CPT copyright 2017 [employer]. All rights reserved. The codes documented in this report are preliminary and upon coder review may  be revised to meet current compliance requirements. Cristopher Estimable. Hosteen Kienast, MD Norvel Richards, MD 05/29/2018 10:01:03 AM This report has been signed electronically. Number of Addenda: 0

## 2018-05-29 NOTE — H&P (Signed)
_0 @   Primary Care Physician:  Terald Sleeper, PA-C Primary Gastroenterologist:  Dr. Gala Romney  Pre-Procedure History & Physical: HPI:  Bradley Fisher is a 52 y.o. male here for first ever screening colonoscopy. Currently, no bowel symptoms.  No family history of colon cancer.  Past Medical History:  Diagnosis Date  . Arthritis   . Asthma   . Depression   . GERD (gastroesophageal reflux disease)   . Hyperlipidemia   . Hypertension   . Seizures ([place])    unknow etiology; on meds for this; no seizures since on meds 10 years ago    Past Surgical History:  Procedure Laterality Date  . KNEE ARTHROSCOPY WITH MEDIAL MENISECTOMY Left 11/11/2016   Procedure: LEFT KNEE ARTHROSCOPY WITH MEDIAL MENISECTOMY;  Surgeon: Carole Civil, MD;  Location: [facility];  Service: Orthopedics;  Laterality: Left;    Prior to Admission medications   Medication Sig Start Date End Date Taking? Authorizing Provider  atorvastatin (LIPITOR) 10 MG tablet Take 1 tablet (10 mg total) daily by mouth. 10/25/17  Yes [name] S, PA-C  CLENPIQ 10-3.5-12 MG-GM -GM/160ML SOLN Take 1 kit by mouth once. Prior to colonoscopy. 03/28/18  Yes [provider]  HYDROcodone-acetaminophen (NORCO) 10-325 MG tablet Take 1 tablet by mouth every 8 (eight) hours as needed for moderate pain. 05/24/18 06/22/18 Yes Terald Sleeper, PA-C  linaclotide (LINZESS) 145 MCG CAPS capsule Take 145 mcg by mouth daily before breakfast.   Yes [provider]  lisinopril (PRINIVIL,ZESTRIL) 10 MG tablet Take 1 tablet (10 mg total) by mouth daily. 08/17/17  Yes Terald Sleeper, PA-C  loratadine (CLARITIN) 10 MG tablet Take 1 tablet (10 mg total) by mouth daily. 11/16/17  Yes Terald Sleeper, PA-C  meloxicam (MOBIC) 15 MG tablet TAKE 1 TABLET BY MOUTH ONCE DAILY 05/01/18  Yes Terald Sleeper, PA-C  omeprazole (PRILOSEC) 20 MG capsule Take 1 Capsule by mouth 2 times a day BEFORE meals Patient taking differently: Take 20 mg by mouth See admin  instructions. Takes 23m every morning. Takes 260min the evening only if needed for heartburn. 08/17/17  Yes JoTerald SleeperPA-C  PROAIR HFA 108 ([id number]) MCG/ACT inhaler Inhale 2 puffs into the lungs every 6 (six) hours as needed for wheezing or shortness of breath.  09/06/16  Yes [provider]  sertraline (ZOLOFT) 100 MG tablet TAKE 1 TABLET BY MOUTH ONCE DAILY 05/09/18  Yes JoTerald SleeperPA-C  HYDROcodone-acetaminophen (NORCO) 10-325 MG tablet Take 1 tablet by mouth every 8 (eight) hours as needed. 05/24/18   JoTerald SleeperPA-C  HYDROcodone-acetaminophen (NORCO) 10-325 MG tablet Take 1 tablet by mouth every 8 (eight) hours as needed. 05/24/18   JoTerald SleeperPA-C    Allergies as of 03/27/2018  . (No Known Allergies)    Family History  Problem Relation Age of Onset  . Hypertension Mother   . Diabetes Mother   . Hypertension Father   . Diabetes Father   . Diabetes Sister   . Colon cancer Neg Hx     Social History   Socioeconomic History  . Marital status: Single    Spouse name: Not on file  . Number of children: Not on file  . Years of education: Not on file  . Highest education level: Not on file  Occupational History  . Not on file  Social Needs  . Financial resource strain: Not on file  . Food insecurity:    Worry: Not  on file    Inability: Not on file  . Transportation needs:    Medical: Not on file    Non-medical: Not on file  Tobacco Use  . Smoking status: Current Some Day Smoker    Types: Cigars  . Smokeless tobacco: Current User    Types: Snuff  . Tobacco comment: cigars uses 2 per month  Substance and Sexual Activity  . Alcohol use: Yes    Alcohol/week: 7.2 oz    Types: 12 Cans of beer per week    Comment: drinks some weekends.   . Drug use: No  . Sexual activity: Yes    Birth control/protection: None  Lifestyle  . Physical activity:    Days per week: Not on file    Minutes per session: Not on file  . Stress: Not on file  Relationships   . Social connections:    Talks on phone: Not on file    Gets together: Not on file    Attends religious service: Not on file    Active member of club or organization: Not on file    Attends meetings of clubs or organizations: Not on file    Relationship status: Not on file  . Intimate partner violence:    Fear of current or ex partner: Not on file    Emotionally abused: Not on file    Physically abused: Not on file    Forced sexual activity: Not on file  Other Topics Concern  . Not on file  Social History Narrative  . Not on file    Review of Systems: See HPI, otherwise negative ROS  Physical Exam: BP (!) 164/86   Pulse (!) 59   Temp 97.7 F (36.5 C) (Oral)   Resp 16   SpO2 95%  General:   Alert,   pleasant and cooperative in NAD SNeck:  Supple; no masses or thyromegaly. No significant cervical adenopathy. Lungs:  Clear throughout to auscultation.   No wheezes, crackles, or rhonchi. No acute distress. Heart:  Regular rate and rhythm; no murmurs, clicks, rubs,  or gallops. Abdomen: Non-distended, normal bowel sounds.  Soft and nontender without appreciable mass or hepatosplenomegaly.  Pulses:  Normal pulses noted. Extremities:  Without clubbing or edema.  Impression: 52 year old gentleman here for first ever screening colonoscopy.  Recommendations:  I have offered the patient a screening colonoscopy today per plan..  The risks, benefits, limitations, alternatives and imponderables have been reviewed with the patient. Questions have been answered. All parties are agreeable.    Notice: This dictation was prepared with Dragon dictation along with smaller phrase technology. Any transcriptional errors that result from this process are unintentional and may not be corrected upon review.

## 2018-05-29 NOTE — Anesthesia Postprocedure Evaluation (Signed)
Anesthesia Post Note  Patient: Bradley Fisher  Procedure(s) Performed: COLONOSCOPY WITH PROPOFOL (N/A )  Patient location during evaluation: PACU Anesthesia Type: MAC Level of consciousness: awake and alert and oriented Pain management: pain level controlled Vital Signs Assessment: post-procedure vital signs reviewed and stable Respiratory status: spontaneous breathing Cardiovascular status: stable Postop Assessment: no apparent nausea or vomiting Anesthetic complications: no     Last Vitals:  Vitals:   05/29/18 0928 05/29/18 1002  BP: (!) 165/98 (!) (P) 108/94  Pulse: (!) 55   Resp: 14 13  Temp:    SpO2: 99%     Last Pain:  Vitals:   05/29/18 0803  TempSrc: Oral  PainSc: 0-No pain                 Gwynne Kemnitz A

## 2018-05-29 NOTE — Anesthesia Preprocedure Evaluation (Signed)
Anesthesia Evaluation  Patient identified by MRN, date of birth, ID band Patient awake    Reviewed: Allergy & Precautions, NPO status , Patient's Chart, lab work & pertinent test results  Airway Mallampati: II  TM Distance: >3 FB Neck ROM: Full    Dental no notable dental hx. (+) Edentulous Upper, Edentulous Lower   Pulmonary neg pulmonary ROS, asthma , Current Smoker,  Denies current Meds or cigarretes  States does dip  Last about 6am today   Pulmonary exam normal breath sounds clear to auscultation       Cardiovascular Exercise Tolerance: Good hypertension, negative cardio ROS Normal cardiovascular examI Rhythm:Regular Rate:Normal     Neuro/Psych Seizures -, Well Controlled,  PSYCHIATRIC DISORDERS Depression No Sz activity in about 10 years  Off Sz meds for 3 months  negative neurological ROS  negative psych ROS   GI/Hepatic negative GI ROS, Neg liver ROS, GERD  Medicated and Controlled,Denies any GERD SX today   Endo/Other  negative endocrine ROS  Renal/GU negative Renal ROS  negative genitourinary   Musculoskeletal negative musculoskeletal ROS (+) Arthritis , Osteoarthritis,    Abdominal   Peds negative pediatric ROS (+)  Hematology negative hematology ROS (+)   Anesthesia Other Findings   Reproductive/Obstetrics negative OB ROS                             Anesthesia Physical Anesthesia Plan  ASA: II  Anesthesia Plan: General   Post-op Pain Management:    Induction: Intravenous  PONV Risk Score and Plan:   Airway Management Planned: Simple Face Mask  Additional Equipment:   Intra-op Plan:   Post-operative Plan:   Informed Consent: I have reviewed the patients History and Physical, chart, labs and discussed the procedure including the risks, benefits and alternatives for the proposed anesthesia with the patient or authorized representative who has indicated his/her  understanding and acceptance.     Plan Discussed with: CRNA  Anesthesia Plan Comments:         Anesthesia Quick Evaluation

## 2018-05-29 NOTE — Transfer of Care (Signed)
Immediate Anesthesia Transfer of Care Note  Patient: Bradley Fisher  Procedure(s) Performed: COLONOSCOPY WITH PROPOFOL (N/A )  Patient Location: PACU  Anesthesia Type:MAC  Level of Consciousness: awake, alert , oriented and patient cooperative  Airway & Oxygen Therapy: Patient Spontanous Breathing  Post-op Assessment: Report given to RN and Post -op Vital signs reviewed and stable  Post vital signs: Reviewed and stable  Last Vitals:  Vitals Value Taken Time  BP    Temp    Pulse    Resp 13 05/29/2018 10:01 AM  SpO2    Vitals shown include unvalidated device data.  Last Pain:  Vitals:   05/29/18 0803  TempSrc: Oral  PainSc: 0-No pain      Patients Stated Pain Goal: 6 (53/66/44 0347)  Complications: No apparent anesthesia complications

## 2018-05-31 ENCOUNTER — Telehealth: Payer: Self-pay | Admitting: Physician Assistant

## 2018-05-31 NOTE — Telephone Encounter (Signed)
Patient was told his blood pressure was elevated when he had went for his colonoscopy.  Advised patient to begin taking his pressures at home . Sitting for 10 minutes and elevating his arm at chest level, take readings several times daily and call if he has elevated numbers to schedule an appointment with his PCP.

## 2018-06-01 ENCOUNTER — Encounter: Payer: Self-pay | Admitting: Internal Medicine

## 2018-06-02 ENCOUNTER — Encounter (HOSPITAL_COMMUNITY): Payer: Self-pay | Admitting: Internal Medicine

## 2018-06-26 ENCOUNTER — Other Ambulatory Visit: Payer: Self-pay | Admitting: Physician Assistant

## 2018-06-27 ENCOUNTER — Other Ambulatory Visit: Payer: Self-pay | Admitting: Physician Assistant

## 2018-06-27 DIAGNOSIS — J301 Allergic rhinitis due to pollen: Secondary | ICD-10-CM

## 2018-06-28 ENCOUNTER — Other Ambulatory Visit: Payer: Self-pay | Admitting: Physician Assistant

## 2018-06-28 ENCOUNTER — Telehealth: Payer: Self-pay | Admitting: Physician Assistant

## 2018-06-28 MED ORDER — CETIRIZINE HCL 10 MG PO TABS: 10.0000 mg | ORAL_TABLET | Freq: Every day | ORAL | 11 refills | Status: DC

## 2018-06-28 NOTE — Telephone Encounter (Signed)
sent 

## 2018-06-28 NOTE — Telephone Encounter (Signed)
Can he find out which allergy pill they DO cover?

## 2018-06-28 NOTE — Telephone Encounter (Signed)
Patient would like for you to send in zyrtec

## 2018-06-28 NOTE — Telephone Encounter (Signed)
Patient aware.

## 2018-07-07 ENCOUNTER — Other Ambulatory Visit: Payer: Self-pay

## 2018-07-10 ENCOUNTER — Other Ambulatory Visit: Payer: Self-pay | Admitting: Physician Assistant

## 2018-07-10 ENCOUNTER — Ambulatory Visit (INDEPENDENT_AMBULATORY_CARE_PROVIDER_SITE_OTHER): Payer: Medicare Other | Admitting: Physician Assistant

## 2018-07-10 ENCOUNTER — Encounter: Payer: Self-pay | Admitting: Physician Assistant

## 2018-07-10 DIAGNOSIS — M159 Polyosteoarthritis, unspecified: Secondary | ICD-10-CM | POA: Diagnosis not present

## 2018-07-10 DIAGNOSIS — M1990 Unspecified osteoarthritis, unspecified site: Secondary | ICD-10-CM | POA: Diagnosis not present

## 2018-07-10 DIAGNOSIS — I10 Essential (primary) hypertension: Secondary | ICD-10-CM | POA: Diagnosis not present

## 2018-07-10 DIAGNOSIS — R0600 Dyspnea, unspecified: Secondary | ICD-10-CM

## 2018-07-10 DIAGNOSIS — G8929 Other chronic pain: Secondary | ICD-10-CM

## 2018-07-10 DIAGNOSIS — M545 Low back pain: Secondary | ICD-10-CM

## 2018-07-10 DIAGNOSIS — F339 Major depressive disorder, recurrent, unspecified: Secondary | ICD-10-CM

## 2018-07-10 MED ORDER — SERTRALINE HCL 100 MG PO TABS: 100.0000 mg | ORAL_TABLET | Freq: Every day | ORAL | 3 refills | Status: DC

## 2018-07-10 MED ORDER — MELOXICAM 15 MG PO TABS: 15.0000 mg | ORAL_TABLET | Freq: Every day | ORAL | 3 refills | Status: DC

## 2018-07-10 MED ORDER — HYDROCODONE-ACETAMINOPHEN 10-325 MG PO TABS: 1.0000 | ORAL_TABLET | Freq: Three times a day (TID) | ORAL | 0 refills | Status: DC | PRN

## 2018-07-10 MED ORDER — ATORVASTATIN CALCIUM 10 MG PO TABS: 10.0000 mg | ORAL_TABLET | Freq: Every day | ORAL | 1 refills | Status: DC

## 2018-07-10 MED ORDER — LISINOPRIL 10 MG PO TABS: 10.0000 mg | ORAL_TABLET | Freq: Every day | ORAL | 3 refills | Status: DC

## 2018-07-10 MED ORDER — CETIRIZINE HCL 10 MG PO TABS: 10.0000 mg | ORAL_TABLET | Freq: Every day | ORAL | 3 refills | Status: DC

## 2018-07-10 MED ORDER — PROAIR HFA 108 (90 BASE) MCG/ACT IN AERS: 2.0000 | INHALATION_SPRAY | Freq: Four times a day (QID) | RESPIRATORY_TRACT | 1 refills | Status: DC | PRN

## 2018-07-10 MED ORDER — OMEPRAZOLE 20 MG PO CPDR: DELAYED_RELEASE_CAPSULE | ORAL | 3 refills | Status: DC

## 2018-07-10 NOTE — Patient Instructions (Signed)
In a few days you may receive a survey in the mail or online from [employer] regarding your visit with us today. Please take a moment to fill this out. Your feedback is very important to our whole office. It can help us better understand your needs as well as improve your experience and satisfaction. Thank you for taking your time to complete it. We care about you.  Johnella Crumm, PA-C  

## 2018-07-11 NOTE — Progress Notes (Signed)
BP 139/80   Pulse (!) 52   Temp 98 F (36.7 C) (Oral)   Ht 6' (1.829 m)   Wt 218 lb 12.8 oz (99.2 kg)   BMI 29.67 kg/m    Subjective:    Patient ID: Bradley Fisher, male    DOB: 05-15-1966, 52 y.o.   MRN: [id number]  HPI: Bradley Fisher is a 52 y.o. male presenting on 07/10/2018 for Allergies (Started back on zyrtec seems to be helping )  This patient comes in for a 31-month recheck on his chronic medical conditions.  They do include allergic rhinitis, hypertension, osteoarthritis, hyperlipidemia.  He does need refills on his medications.  He has been taking loratadine is not been working well so we have switched to Zyrtec.  And he does feel that this has improved his allergies.  .  And he does feel his back and joints are fairly stable.  He is doing well with his depression also.  Past Medical History:  Diagnosis Date  . Arthritis   . Asthma   . Depression   . GERD (gastroesophageal reflux disease)   . Hyperlipidemia   . Hypertension   . Seizures ([place])    unknow etiology; on meds for this; no seizures since on meds 10 years ago   Relevant past medical, surgical, family and social history reviewed and updated as indicated. Interim medical history since our last visit reviewed. Allergies and medications reviewed and updated. DATA REVIEWED: CHART IN EPIC  Family History reviewed for pertinent findings.  Review of Systems  Constitutional: Negative.  Negative for appetite change and fatigue.  HENT: Negative.   Eyes: Negative.  Negative for pain and visual disturbance.  Respiratory: Negative.  Negative for cough, chest tightness, shortness of breath and wheezing.   Cardiovascular: Negative.  Negative for chest pain, palpitations and leg swelling.  Gastrointestinal: Negative.  Negative for abdominal pain, diarrhea, nausea and vomiting.  Endocrine: Negative.   Genitourinary: Negative.   Musculoskeletal: Negative.   Skin: Negative.  Negative for color change and rash.    Neurological: Negative.  Negative for weakness, numbness and headaches.  Psychiatric/Behavioral: Negative.     Allergies as of 07/10/2018   No Known Allergies     Medication List        Accurate as of 07/10/18 11:59 PM. Always use your most recent med list.          atorvastatin 10 MG tablet Commonly known as:  LIPITOR Take 1 tablet (10 mg total) by mouth daily.   cetirizine 10 MG tablet Commonly known as:  ZYRTEC Take 1 tablet (10 mg total) by mouth daily.   HYDROcodone-acetaminophen 10-325 MG tablet Commonly known as:  NORCO Take 1 tablet by mouth every 8 (eight) hours as needed.   HYDROcodone-acetaminophen 10-325 MG tablet Commonly known as:  NORCO Take 1 tablet by mouth every 8 (eight) hours as needed.   HYDROcodone-acetaminophen 10-325 MG tablet Commonly known as:  NORCO Take 1 tablet by mouth every 8 (eight) hours as needed.   lisinopril 10 MG tablet Commonly known as:  PRINIVIL,ZESTRIL Take 1 tablet (10 mg total) by mouth daily.   meloxicam 15 MG tablet Commonly known as:  MOBIC Take 1 tablet (15 mg total) by mouth daily.   omeprazole 20 MG capsule Commonly known as:  PRILOSEC Take 1 Capsule by mouth 2 times a day BEFORE meals   PROAIR HFA 108 (90 Base) MCG/ACT inhaler Generic drug:  albuterol Inhale 2 puffs into the  lungs every 6 (six) hours as needed for wheezing or shortness of breath.   sertraline 100 MG tablet Commonly known as:  ZOLOFT Take 1 tablet (100 mg total) by mouth daily.          Objective:    BP 139/80   Pulse (!) 52   Temp 98 F (36.7 C) (Oral)   Ht 6' (1.829 m)   Wt 218 lb 12.8 oz (99.2 kg)   BMI 29.67 kg/m   No Known Allergies  Wt Readings from Last 3 Encounters:  07/10/18 218 lb 12.8 oz (99.2 kg)  05/24/18 216 lb 3.2 oz (98.1 kg)  05/23/18 217 lb (98.4 kg)    Physical Exam  Constitutional: He appears well-developed and well-nourished. No distress.  HENT:  Head: Normocephalic and atraumatic.  Eyes: Pupils are  equal, round, and reactive to light. Conjunctivae and EOM are normal.  Cardiovascular: Normal rate, regular rhythm and normal heart sounds.  Pulmonary/Chest: Effort normal and breath sounds normal. No respiratory distress.  Skin: Skin is warm and dry.  Psychiatric: He has a normal mood and affect. His behavior is normal.  Nursing note and vitals reviewed.       Assessment & Plan:   1. Arthritis - HYDROcodone-acetaminophen (NORCO) 10-325 MG tablet; Take 1 tablet by mouth every 8 (eight) hours as needed.  Dispense: 90 tablet; Refill: 0 - HYDROcodone-acetaminophen (NORCO) 10-325 MG tablet; Take 1 tablet by mouth every 8 (eight) hours as needed.  Dispense: 90 tablet; Refill: 0  2. Hypertension, essential - lisinopril (PRINIVIL,ZESTRIL) 10 MG tablet; Take 1 tablet (10 mg total) by mouth daily.  Dispense: 90 tablet; Refill: 3  3. Generalized OA - meloxicam (MOBIC) 15 MG tablet; Take 1 tablet (15 mg total) by mouth daily.  Dispense: 90 tablet; Refill: 3  4. Chronic midline low back pain without sciatica - meloxicam (MOBIC) 15 MG tablet; Take 1 tablet (15 mg total) by mouth daily.  Dispense: 90 tablet; Refill: 3  5. Dyspnea, unspecified type - PROAIR HFA 108 (90 Base) MCG/ACT inhaler; Inhale 2 puffs into the lungs every 6 (six) hours as needed for wheezing or shortness of breath.  Dispense: 18 g; Refill: 1  6. Depression, recurrent ([place]) - sertraline (ZOLOFT) 100 MG tablet; Take 1 tablet (100 mg total) by mouth daily.  Dispense: 90 tablet; Refill: 3   Continue all other maintenance medications as listed above.  Follow up plan: Return in about 3 months (around 10/10/2018) for recheck meds.  Educational handout given for [place] PA-C [facility] [address]  [place], [place] [address] [phone]   07/11/2018, 8:03 AM

## 2018-07-31 ENCOUNTER — Other Ambulatory Visit: Payer: Self-pay | Admitting: Physician Assistant

## 2018-08-24 ENCOUNTER — Ambulatory Visit: Payer: Medicare Other | Admitting: Physician Assistant

## 2018-08-28 ENCOUNTER — Encounter: Payer: Self-pay | Admitting: Physician Assistant

## 2018-09-19 ENCOUNTER — Other Ambulatory Visit: Payer: Self-pay | Admitting: Physician Assistant

## 2018-09-27 ENCOUNTER — Ambulatory Visit (INDEPENDENT_AMBULATORY_CARE_PROVIDER_SITE_OTHER): Payer: Medicare Other

## 2018-09-27 DIAGNOSIS — Z23 Encounter for immunization: Secondary | ICD-10-CM

## 2018-10-10 ENCOUNTER — Ambulatory Visit (INDEPENDENT_AMBULATORY_CARE_PROVIDER_SITE_OTHER): Payer: Medicare Other | Admitting: Physician Assistant

## 2018-10-10 ENCOUNTER — Encounter: Payer: Self-pay | Admitting: Physician Assistant

## 2018-10-10 VITALS — BP 148/90 | HR 82 | Temp 96.9°F | Ht 72.0 in | Wt 218.6 lb

## 2018-10-10 DIAGNOSIS — M1990 Unspecified osteoarthritis, unspecified site: Secondary | ICD-10-CM | POA: Diagnosis not present

## 2018-10-10 DIAGNOSIS — I10 Essential (primary) hypertension: Secondary | ICD-10-CM | POA: Diagnosis not present

## 2018-10-10 DIAGNOSIS — M159 Polyosteoarthritis, unspecified: Secondary | ICD-10-CM | POA: Diagnosis not present

## 2018-10-10 MED ORDER — OMEPRAZOLE 20 MG PO CPDR: DELAYED_RELEASE_CAPSULE | ORAL | 3 refills | Status: DC

## 2018-10-10 MED ORDER — ATORVASTATIN CALCIUM 10 MG PO TABS: 10.0000 mg | ORAL_TABLET | Freq: Every day | ORAL | 3 refills | Status: DC

## 2018-10-10 MED ORDER — HYDROCODONE-ACETAMINOPHEN 10-325 MG PO TABS: 1.0000 | ORAL_TABLET | Freq: Three times a day (TID) | ORAL | 0 refills | Status: DC | PRN

## 2018-10-12 NOTE — Progress Notes (Signed)
BP (!) 148/90   Pulse 82   Temp (!) 96.9 F (36.1 C) (Oral)   Ht 6' (1.829 m)   Wt 218 lb 9.6 oz (99.2 kg)   BMI 29.65 kg/m    Subjective:    Patient ID: Bradley Fisher, male    DOB: 1966-06-05, 52 y.o.   MRN: [id number]  HPI: Bradley Fisher is a 52 y.o. male presenting on 10/10/2018 for Hypertension (3 month follow up )  This patient comes in for periodic recheck on medications and conditions including OA, hypertension.   All medications are reviewed today. There are no reports of any problems with the medications. All of the medical conditions are reviewed and updated.  Lab work is reviewed and will be ordered as medically necessary. There are no new problems reported with today's visit.   Past Medical History:  Diagnosis Date  . Arthritis   . Asthma   . Depression   . GERD (gastroesophageal reflux disease)   . Hyperlipidemia   . Hypertension   . Seizures ([place])    unknow etiology; on meds for this; no seizures since on meds 10 years ago   Relevant past medical, surgical, family and social history reviewed and updated as indicated. Interim medical history since our last visit reviewed. Allergies and medications reviewed and updated. DATA REVIEWED: CHART IN EPIC  Family History reviewed for pertinent findings.  Review of Systems  Constitutional: Negative.  Negative for appetite change and fatigue.  Eyes: Negative for pain and visual disturbance.  Respiratory: Negative.  Negative for cough, chest tightness, shortness of breath and wheezing.   Cardiovascular: Negative.  Negative for chest pain, palpitations and leg swelling.  Gastrointestinal: Negative.  Negative for abdominal pain, diarrhea, nausea and vomiting.  Genitourinary: Negative.   Musculoskeletal: Positive for arthralgias, joint swelling and myalgias.  Skin: Negative.  Negative for color change and rash.  Neurological: Negative.  Negative for weakness, numbness and headaches.  Psychiatric/Behavioral:  Negative.     Allergies as of 10/10/2018   No Known Allergies     Medication List        Accurate as of 10/10/18 11:59 PM. Always use your most recent med list.          atorvastatin 10 MG tablet Commonly known as:  LIPITOR Take 1 tablet (10 mg total) by mouth daily.   cetirizine 10 MG tablet Commonly known as:  ZYRTEC Take 1 tablet (10 mg total) by mouth daily.   HYDROcodone-acetaminophen 10-325 MG tablet Commonly known as:  NORCO Take 1 tablet by mouth every 8 (eight) hours as needed.   HYDROcodone-acetaminophen 10-325 MG tablet Commonly known as:  NORCO Take 1 tablet by mouth every 8 (eight) hours as needed.   HYDROcodone-acetaminophen 10-325 MG tablet Commonly known as:  NORCO Take 1 tablet by mouth every 8 (eight) hours as needed.   lisinopril 10 MG tablet Commonly known as:  PRINIVIL,ZESTRIL Take 1 tablet (10 mg total) by mouth daily.   meloxicam 15 MG tablet Commonly known as:  MOBIC Take 1 tablet (15 mg total) by mouth daily.   omeprazole 20 MG capsule Commonly known as:  PRILOSEC TAKE 1 CAPSULE BY MOUTH TWICE DAILY BEFORE MEALS   PROAIR HFA 108 (90 Base) MCG/ACT inhaler Generic drug:  albuterol Inhale 2 puffs into the lungs every 6 (six) hours as needed for wheezing or shortness of breath.   sertraline 100 MG tablet Commonly known as:  ZOLOFT Take 1 tablet (100 mg total) by mouth  daily.          Objective:    BP (!) 148/90   Pulse 82   Temp (!) 96.9 F (36.1 C) (Oral)   Ht 6' (1.829 m)   Wt 218 lb 9.6 oz (99.2 kg)   BMI 29.65 kg/m   No Known Allergies  Wt Readings from Last 3 Encounters:  10/10/18 218 lb 9.6 oz (99.2 kg)  07/10/18 218 lb 12.8 oz (99.2 kg)  05/24/18 216 lb 3.2 oz (98.1 kg)    Physical Exam  Constitutional: He appears well-developed and well-nourished. No distress.  HENT:  Head: Normocephalic and atraumatic.  Eyes: Pupils are equal, round, and reactive to light. Conjunctivae and EOM are normal.  Cardiovascular:  Normal rate, regular rhythm and normal heart sounds.  Pulmonary/Chest: Effort normal and breath sounds normal. No respiratory distress.  Skin: Skin is warm and dry.  Psychiatric: He has a normal mood and affect. His behavior is normal.  Nursing note and vitals reviewed.   Results for orders placed or performed in visit on 02/27/18  CBC with Differential/Platelet  Result Value Ref Range   WBC 6.6 3.4 - 10.8 x10E3/uL   RBC 4.66 4.14 - 5.80 x10E6/uL   Hemoglobin 14.4 13.0 - 17.7 g/dL   Hematocrit 39.8 37.5 - 51.0 %   MCV 85 79 - 97 fL   MCH 30.9 26.6 - 33.0 pg   MCHC 36.2 (H) 31.5 - 35.7 g/dL   RDW 13.5 12.3 - 15.4 %   Platelets 191 150 - 379 x10E3/uL   Neutrophils 60 Not Estab. %   Lymphs 27 Not Estab. %   Monocytes 7 Not Estab. %   Eos 5 Not Estab. %   Basos 1 Not Estab. %   Neutrophils Absolute 4.0 1.4 - 7.0 x10E3/uL   Lymphocytes Absolute 1.8 0.7 - 3.1 x10E3/uL   Monocytes Absolute 0.4 0.1 - 0.9 x10E3/uL   EOS (ABSOLUTE) 0.3 0.0 - 0.4 x10E3/uL   Basophils Absolute 0.1 0.0 - 0.2 x10E3/uL   Immature Granulocytes 0 Not Estab. %   Immature Grans (Abs) 0.0 0.0 - 0.1 x10E3/uL  CMP14+EGFR  Result Value Ref Range   Glucose 95 65 - 99 mg/dL   BUN 15 6 - 24 mg/dL   Creatinine, Ser 0.77 0.76 - 1.27 mg/dL   GFR calc non Af Amer 104 >59 mL/min/1.73   GFR calc Af Amer 121 >59 mL/min/1.73   BUN/Creatinine Ratio 19 9 - 20   Sodium 141 134 - 144 mmol/L   Potassium 4.2 3.5 - 5.2 mmol/L   Chloride 100 96 - 106 mmol/L   CO2 24 20 - 29 mmol/L   Calcium 9.7 8.7 - 10.2 mg/dL   Total Protein 7.1 6.0 - 8.5 g/dL   Albumin 4.6 3.5 - 5.5 g/dL   Globulin, Total 2.5 1.5 - 4.5 g/dL   Albumin/Globulin Ratio 1.8 1.2 - 2.2   Bilirubin Total 0.4 0.0 - 1.2 mg/dL   Alkaline Phosphatase 102 39 - 117 IU/L   AST 29 0 - 40 IU/L   ALT 19 0 - 44 IU/L  Lipid panel  Result Value Ref Range   Cholesterol, Total 150 100 - 199 mg/dL   Triglycerides 158 (H) 0 - 149 mg/dL   HDL 44 >39 mg/dL   VLDL Cholesterol  Cal 32 5 - 40 mg/dL   LDL Calculated 74 0 - 99 mg/dL   Chol/HDL Ratio 3.4 0.0 - 5.0 ratio  PSA  Result Value Ref Range   Prostate  Specific Ag, Serum 0.2 0.0 - 4.0 ng/mL      Assessment & Plan:   1. Arthritis - HYDROcodone-acetaminophen (NORCO) 10-325 MG tablet; Take 1 tablet by mouth every 8 (eight) hours as needed.  Dispense: 90 tablet; Refill: 0 - HYDROcodone-acetaminophen (NORCO) 10-325 MG tablet; Take 1 tablet by mouth every 8 (eight) hours as needed.  Dispense: 90 tablet; Refill: 0  2. Hypertension, essential Continue medications  3. Generalized OA Continue walking   Continue all other maintenance medications as listed above.  Follow up plan: Return in about 3 months (around 01/10/2019) for recheck and labs.  Educational handout given for [place] PA-C [facility] [address]  [place], [place] [address] [phone]   10/12/2018, 10:11 PM

## 2018-11-24 ENCOUNTER — Other Ambulatory Visit: Payer: Self-pay | Admitting: Physician Assistant

## 2018-11-24 DIAGNOSIS — M545 Low back pain: Secondary | ICD-10-CM

## 2018-11-24 DIAGNOSIS — M159 Polyosteoarthritis, unspecified: Secondary | ICD-10-CM

## 2018-11-24 DIAGNOSIS — G8929 Other chronic pain: Secondary | ICD-10-CM

## 2019-01-01 ENCOUNTER — Encounter: Payer: Self-pay | Admitting: Physician Assistant

## 2019-01-01 ENCOUNTER — Ambulatory Visit (INDEPENDENT_AMBULATORY_CARE_PROVIDER_SITE_OTHER): Payer: Medicare Other | Admitting: Physician Assistant

## 2019-01-01 DIAGNOSIS — I10 Essential (primary) hypertension: Secondary | ICD-10-CM

## 2019-01-01 MED ORDER — LISINOPRIL 20 MG PO TABS: 20.0000 mg | ORAL_TABLET | Freq: Every day | ORAL | 1 refills | Status: DC

## 2019-01-01 NOTE — Patient Instructions (Signed)
DASH Eating Plan  DASH stands for "Dietary Approaches to Stop Hypertension." The DASH eating plan is a healthy eating plan that has been shown to reduce high blood pressure (hypertension). It may also reduce your risk for type 2 diabetes, heart disease, and stroke. The DASH eating plan may also help with weight loss.  What are tips for following this plan?    General guidelines   Avoid eating more than 2,300 mg (milligrams) of salt (sodium) a day. If you have hypertension, you may need to reduce your sodium intake to 1,500 mg a day.   Limit alcohol intake to no more than 1 drink a day for nonpregnant women and 2 drinks a day for men. One drink equals 12 oz of beer, 5 oz of wine, or 1 oz of hard liquor.   Work with your health care provider to maintain a healthy body weight or to lose weight. Ask what an ideal weight is for you.   Get at least 30 minutes of exercise that causes your heart to beat faster (aerobic exercise) most days of the week. Activities may include walking, swimming, or biking.   Work with your health care provider or diet and nutrition specialist (dietitian) to adjust your eating plan to your individual calorie needs.  Reading food labels     Check food labels for the amount of sodium per serving. Choose foods with less than 5 percent of the Daily Value of sodium. Generally, foods with less than 300 mg of sodium per serving fit into this eating plan.   To find whole grains, look for the word "whole" as the first word in the ingredient list.  Shopping   Buy products labeled as "low-sodium" or "no salt added."   Buy fresh foods. Avoid canned foods and premade or frozen meals.  Cooking   Avoid adding salt when cooking. Use salt-free seasonings or herbs instead of table salt or sea salt. Check with your health care provider or pharmacist before using salt substitutes.   Do not fry foods. Cook foods using healthy methods such as baking, boiling, grilling, and broiling instead.   Cook with  heart-healthy oils, such as olive, canola, soybean, or sunflower oil.  Meal planning   Eat a balanced diet that includes:  ? 5 or more servings of fruits and vegetables each day. At each meal, try to fill half of your plate with fruits and vegetables.  ? Up to 6-8 servings of whole grains each day.  ? Less than 6 oz of lean meat, poultry, or fish each day. A 3-oz serving of meat is about the same size as a deck of cards. One egg equals 1 oz.  ? 2 servings of low-fat dairy each day.  ? A serving of nuts, seeds, or beans 5 times each week.  ? Heart-healthy fats. Healthy fats called Omega-3 fatty acids are found in foods such as flaxseeds and coldwater fish, like sardines, salmon, and mackerel.   Limit how much you eat of the following:  ? Canned or prepackaged foods.  ? Food that is high in trans fat, such as fried foods.  ? Food that is high in saturated fat, such as fatty meat.  ? Sweets, desserts, sugary drinks, and other foods with added sugar.  ? Full-fat dairy products.   Do not salt foods before eating.   Try to eat at least 2 vegetarian meals each week.   Eat more home-cooked food and less restaurant, buffet, and fast food.     When eating at a restaurant, ask that your food be prepared with less salt or no salt, if possible.  What foods are recommended?  The items listed may not be a complete list. Talk with your dietitian about what dietary choices are best for you.  Grains  Whole-grain or whole-wheat bread. Whole-grain or whole-wheat pasta. Brown rice. Oatmeal. Quinoa. Bulgur. Whole-grain and low-sodium cereals. Pita bread. Low-fat, low-sodium crackers. Whole-wheat flour tortillas.  Vegetables  Fresh or frozen vegetables (raw, steamed, roasted, or grilled). Low-sodium or reduced-sodium tomato and vegetable juice. Low-sodium or reduced-sodium tomato sauce and tomato paste. Low-sodium or reduced-sodium canned vegetables.  Fruits  All fresh, dried, or frozen fruit. Canned fruit in natural juice (without  added sugar).  Meat and other protein foods  Skinless chicken or turkey. Ground chicken or turkey. Pork with fat trimmed off. Fish and seafood. Egg whites. Dried beans, peas, or lentils. Unsalted nuts, nut butters, and seeds. Unsalted canned beans. Lean cuts of beef with fat trimmed off. Low-sodium, lean deli meat.  Dairy  Low-fat (1%) or fat-free (skim) milk. Fat-free, low-fat, or reduced-fat cheeses. Nonfat, low-sodium ricotta or cottage cheese. Low-fat or nonfat yogurt. Low-fat, low-sodium cheese.  Fats and oils  Soft margarine without trans fats. Vegetable oil. Low-fat, reduced-fat, or light mayonnaise and salad dressings (reduced-sodium). Canola, safflower, olive, soybean, and sunflower oils. Avocado.  Seasoning and other foods  Herbs. Spices. Seasoning mixes without salt. Unsalted popcorn and pretzels. Fat-free sweets.  What foods are not recommended?  The items listed may not be a complete list. Talk with your dietitian about what dietary choices are best for you.  Grains  Baked goods made with fat, such as croissants, muffins, or some breads. Dry pasta or rice meal packs.  Vegetables  Creamed or fried vegetables. Vegetables in a cheese sauce. Regular canned vegetables (not low-sodium or reduced-sodium). Regular canned tomato sauce and paste (not low-sodium or reduced-sodium). Regular tomato and vegetable juice (not low-sodium or reduced-sodium). Pickles. Olives.  Fruits  Canned fruit in a light or heavy syrup. Fried fruit. Fruit in cream or butter sauce.  Meat and other protein foods  Fatty cuts of meat. Ribs. Fried meat. Bacon. Sausage. Bologna and other processed lunch meats. Salami. Fatback. Hotdogs. Bratwurst. Salted nuts and seeds. Canned beans with added salt. Canned or smoked fish. Whole eggs or egg yolks. Chicken or turkey with skin.  Dairy  Whole or 2% milk, cream, and half-and-half. Whole or full-fat cream cheese. Whole-fat or sweetened yogurt. Full-fat cheese. Nondairy creamers. Whipped toppings.  Processed cheese and cheese spreads.  Fats and oils  Butter. Stick margarine. Lard. Shortening. Ghee. Bacon fat. Tropical oils, such as coconut, palm kernel, or palm oil.  Seasoning and other foods  Salted popcorn and pretzels. Onion salt, garlic salt, seasoned salt, table salt, and sea salt. Worcestershire sauce. Tartar sauce. Barbecue sauce. Teriyaki sauce. Soy sauce, including reduced-sodium. Steak sauce. Canned and packaged gravies. Fish sauce. Oyster sauce. Cocktail sauce. Horseradish that you find on the shelf. Ketchup. Mustard. Meat flavorings and tenderizers. Bouillon cubes. Hot sauce and Tabasco sauce. Premade or packaged marinades. Premade or packaged taco seasonings. Relishes. Regular salad dressings.  Where to find more information:   National Heart, Lung, and Blood Institute: www.nhlbi.nih.gov   American Heart Association: www.heart.org  Summary   The DASH eating plan is a healthy eating plan that has been shown to reduce high blood pressure (hypertension). It may also reduce your risk for type 2 diabetes, heart disease, and stroke.   With the   DASH eating plan, you should limit salt (sodium) intake to 2,300 mg a day. If you have hypertension, you may need to reduce your sodium intake to 1,500 mg a day.   When on the DASH eating plan, aim to eat more fresh fruits and vegetables, whole grains, lean proteins, low-fat dairy, and heart-healthy fats.   Work with your health care provider or diet and nutrition specialist (dietitian) to adjust your eating plan to your individual calorie needs.  This information is not intended to replace advice given to you by your health care provider. Make sure you discuss any questions you have with your health care provider.  Document Released: 11/18/2011 Document Revised: 11/22/2016 Document Reviewed: 11/22/2016  Elsevier Interactive Patient Education  2019 [employer] Inc.

## 2019-01-03 NOTE — Progress Notes (Signed)
   BP (!) 151/96   Pulse 62   Temp (!) 96.8 F (36 C) (Oral)   Ht 6' (1.829 m)   Wt 227 lb (103 kg)   BMI 30.79 kg/m    Subjective:    Patient ID: Bradley Fisher, male    DOB: 05/30/1966, 53 y.o.   MRN: [id number]  HPI: Bradley Fisher is a 53 y.o. male presenting on 01/01/2019 for Dizziness (happened Thursday)  Patient had an episode where he was dizzy on Thursday after working outside and doing a lot of work.  His blood pressure readings have been more elevated so he thinks that it probably was elevated that day also.  And admits that he was not drinking very much water during this time and probably was a little dehydrated.  He has had no episode ever since then.  Past Medical History:  Diagnosis Date  . Arthritis   . Asthma   . Depression   . GERD (gastroesophageal reflux disease)   . Hyperlipidemia   . Hypertension   . Seizures ([facility])    unknow etiology; on meds for this; no seizures since on meds 10 years ago   Relevant past medical, surgical, family and social history reviewed and updated as indicated. Interim medical history since our last visit reviewed. Allergies and medications reviewed and updated. DATA REVIEWED: CHART IN EPIC  Family History reviewed for pertinent findings.  Review of Systems  Constitutional: Negative.  Negative for appetite change and fatigue.  Eyes: Negative for pain and visual disturbance.  Respiratory: Negative.  Negative for cough, chest tightness, shortness of breath and wheezing.   Cardiovascular: Negative.  Negative for chest pain, palpitations and leg swelling.  Gastrointestinal: Negative.  Negative for abdominal pain, diarrhea, nausea and vomiting.  Genitourinary: Negative.   Skin: Negative.  Negative for color change and rash.  Neurological: Negative.  Negative for weakness, numbness and headaches.  Psychiatric/Behavioral: Negative.     Allergies as of 01/01/2019   No Known Allergies     Medication List       Accurate  as of January 01, 2019 11:59 PM. Always use your most recent med list.        atorvastatin 10 MG tablet Commonly known as:  LIPITOR Take 1 tablet (10 mg total) by mouth daily.   cetirizine 10 MG tablet Commonly known as:  ZYRTEC Take 1 tablet (10 mg total) by mouth daily.   HYDROcodone-acetaminophen 10-325 MG tablet Commonly known as:  NORCO Take 1 tablet by mouth every 8 (eight) hours as needed.   HYDROcodone-acetaminophen 10-325 MG tablet Commonly known as:  NORCO Take 1 tablet by mouth every 8 (eight) hours as needed.   HYDROcodone-acetaminophen 10-325 MG tablet Commonly known as:  NORCO Take 1 tablet by mouth every 8 (eight) hours as needed.   lisinopril 20 MG tablet Commonly known as:  PRINIVIL,ZESTRIL Take 1 tablet (20 mg total) by mouth daily.   meloxicam 15 MG tablet Commonly known as:  MOBIC TAKE 1 TABLET BY MOUTH EVERY DAY   omeprazole 20 MG capsule Commonly known as:  PRILOSEC TAKE 1 CAPSULE BY MOUTH TWICE DAILY BEFORE MEALS   PROAIR HFA 108 (90 Base) MCG/ACT inhaler Generic drug:  albuterol Inhale 2 puffs into the lungs every 6 (six) hours as needed for wheezing or shortness of breath.   sertraline 100 MG tablet Commonly known as:  ZOLOFT Take 1 tablet (100 mg total) by mouth daily.            Objective:    BP (!) 151/96   Pulse 62   Temp (!) 96.8 F (36 C) (Oral)   Ht 6' (1.829 m)   Wt 227 lb (103 kg)   BMI 30.79 kg/m   No Known Allergies  Wt Readings from Last 3 Encounters:  01/01/19 227 lb (103 kg)  10/10/18 218 lb 9.6 oz (99.2 kg)  07/10/18 218 lb 12.8 oz (99.2 kg)    Physical Exam Vitals signs and nursing note reviewed.  Constitutional:      General: He is not in acute distress.    Appearance: He is well-developed.  HENT:     Head: Normocephalic and atraumatic.  Eyes:     Conjunctiva/sclera: Conjunctivae normal.     Pupils: Pupils are equal, round, and reactive to light.  Cardiovascular:     Rate and Rhythm: Normal rate and  regular rhythm.     Heart sounds: Normal heart sounds.  Pulmonary:     Effort: Pulmonary effort is normal. No respiratory distress.     Breath sounds: Normal breath sounds.  Skin:    General: Skin is warm and dry.  Psychiatric:        Behavior: Behavior normal.     Results for orders placed or performed in visit on 02/27/18  CBC with Differential/Platelet  Result Value Ref Range   WBC 6.6 3.4 - 10.8 x10E3/uL   RBC 4.66 4.14 - 5.80 x10E6/uL   Hemoglobin 14.4 13.0 - 17.7 g/dL   Hematocrit 39.8 37.5 - 51.0 %   MCV 85 79 - 97 fL   MCH 30.9 26.6 - 33.0 pg   MCHC 36.2 (H) 31.5 - 35.7 g/dL   RDW 13.5 12.3 - 15.4 %   Platelets 191 150 - 379 x10E3/uL   Neutrophils 60 Not Estab. %   Lymphs 27 Not Estab. %   Monocytes 7 Not Estab. %   Eos 5 Not Estab. %   Basos 1 Not Estab. %   Neutrophils Absolute 4.0 1.4 - 7.0 x10E3/uL   Lymphocytes Absolute 1.8 0.7 - 3.1 x10E3/uL   Monocytes Absolute 0.4 0.1 - 0.9 x10E3/uL   EOS (ABSOLUTE) 0.3 0.0 - 0.4 x10E3/uL   Basophils Absolute 0.1 0.0 - 0.2 x10E3/uL   Immature Granulocytes 0 Not Estab. %   Immature Grans (Abs) 0.0 0.0 - 0.1 x10E3/uL  CMP14+EGFR  Result Value Ref Range   Glucose 95 65 - 99 mg/dL   BUN 15 6 - 24 mg/dL   Creatinine, Ser 0.77 0.76 - 1.27 mg/dL   GFR calc non Af Amer 104 >59 mL/min/1.73   GFR calc Af Amer 121 >59 mL/min/1.73   BUN/Creatinine Ratio 19 9 - 20   Sodium 141 134 - 144 mmol/L   Potassium 4.2 3.5 - 5.2 mmol/L   Chloride 100 96 - 106 mmol/L   CO2 24 20 - 29 mmol/L   Calcium 9.7 8.7 - 10.2 mg/dL   Total Protein 7.1 6.0 - 8.5 g/dL   Albumin 4.6 3.5 - 5.5 g/dL   Globulin, Total 2.5 1.5 - 4.5 g/dL   Albumin/Globulin Ratio 1.8 1.2 - 2.2   Bilirubin Total 0.4 0.0 - 1.2 mg/dL   Alkaline Phosphatase 102 39 - 117 IU/L   AST 29 0 - 40 IU/L   ALT 19 0 - 44 IU/L  Lipid panel  Result Value Ref Range   Cholesterol, Total 150 100 - 199 mg/dL   Triglycerides 158 (H) 0 - 149 mg/dL   HDL 44 >39 mg/dL  VLDL Cholesterol  Cal 32 5 - 40 mg/dL   LDL Calculated 74 0 - 99 mg/dL   Chol/HDL Ratio 3.4 0.0 - 5.0 ratio  PSA  Result Value Ref Range   Prostate Specific Ag, Serum 0.2 0.0 - 4.0 ng/mL      Assessment & Plan:   1. Hypertension, essential - lisinopril (PRINIVIL,ZESTRIL) 20 MG tablet; Take 1 tablet (20 mg total) by mouth daily.  Dispense: 30 tablet; Refill: 1   Continue all other maintenance medications as listed above.  Follow up plan: No follow-ups on file.  Educational handout given for survey  Angel S. Jones PA-C [facility] [address]  [place], [place] [address] [phone]   01/03/2019, 1:48 PM  

## 2019-01-10 ENCOUNTER — Encounter: Payer: Self-pay | Admitting: Physician Assistant

## 2019-01-10 ENCOUNTER — Ambulatory Visit (INDEPENDENT_AMBULATORY_CARE_PROVIDER_SITE_OTHER): Payer: Medicare Other | Admitting: Physician Assistant

## 2019-01-10 VITALS — BP 156/95 | HR 56 | Temp 96.8°F | Ht 72.0 in | Wt 226.0 lb

## 2019-01-10 DIAGNOSIS — M545 Low back pain: Secondary | ICD-10-CM | POA: Diagnosis not present

## 2019-01-10 DIAGNOSIS — G8929 Other chronic pain: Secondary | ICD-10-CM

## 2019-01-10 DIAGNOSIS — I10 Essential (primary) hypertension: Secondary | ICD-10-CM | POA: Diagnosis not present

## 2019-01-10 DIAGNOSIS — K219 Gastro-esophageal reflux disease without esophagitis: Secondary | ICD-10-CM

## 2019-01-10 DIAGNOSIS — Z0000 Encounter for general adult medical examination without abnormal findings: Secondary | ICD-10-CM

## 2019-01-10 DIAGNOSIS — M1990 Unspecified osteoarthritis, unspecified site: Secondary | ICD-10-CM

## 2019-01-10 DIAGNOSIS — M159 Polyosteoarthritis, unspecified: Secondary | ICD-10-CM

## 2019-01-10 MED ORDER — HYDROCODONE-ACETAMINOPHEN 10-325 MG PO TABS: 1.0000 | ORAL_TABLET | Freq: Three times a day (TID) | ORAL | 0 refills | Status: DC | PRN

## 2019-01-10 MED ORDER — AMLODIPINE BESYLATE 5 MG PO TABS: 5.0000 mg | ORAL_TABLET | Freq: Every day | ORAL | 3 refills | Status: DC

## 2019-01-10 NOTE — Progress Notes (Signed)
BP (!) 156/95   Pulse (!) 56   Temp (!) 96.8 F (36 C) (Oral)   Ht 6' (1.829 m)   Wt 226 lb (102.5 kg)   BMI 30.65 kg/m    Subjective:    Patient ID: Bradley Fisher, male    DOB: 1966-08-03, 53 y.o.   MRN: [id number]  HPI: Bradley Fisher is a 53 y.o. male presenting on 01/10/2019 for Hypertension (3 month ) and Pain  This patient comes in for periodic recheck on medications and conditions including arthritis, hypertension, sciatica, GERD. He has more issues with BP this month. There has not been much improvement in the blood pressure. Denies any chest pain.  All medications are reviewed today. There are no reports of any problems with the medications. All of the medical conditions are reviewed and updated.  Lab work is reviewed and will be ordered as medically necessary. There are no new problems reported with today's visit.   Past Medical History:  Diagnosis Date  . Arthritis   . Asthma   . Depression   . GERD (gastroesophageal reflux disease)   . Hyperlipidemia   . Hypertension   . Seizures ([place])    unknow etiology; on meds for this; no seizures since on meds 10 years ago   Relevant past medical, surgical, family and social history reviewed and updated as indicated. Interim medical history since our last visit reviewed. Allergies and medications reviewed and updated. DATA REVIEWED: CHART IN EPIC  Family History reviewed for pertinent findings.  Review of Systems  Constitutional: Negative.  Negative for appetite change and fatigue.  Eyes: Negative for pain and visual disturbance.  Respiratory: Negative.  Negative for cough, chest tightness, shortness of breath and wheezing.   Cardiovascular: Negative.  Negative for chest pain, palpitations and leg swelling.  Gastrointestinal: Negative.  Negative for abdominal pain, diarrhea, nausea and vomiting.  Genitourinary: Negative.   Musculoskeletal: Positive for arthralgias, back pain, joint swelling and myalgias.  Skin:  Negative.  Negative for color change and rash.  Neurological: Negative.  Negative for weakness, numbness and headaches.  Psychiatric/Behavioral: Negative.     Allergies as of 01/10/2019   No Known Allergies     Medication List       Accurate as of January 10, 2019  7:19 PM. Always use your most recent med list.        amLODipine 5 MG tablet Commonly known as:  NORVASC Take 1 tablet (5 mg total) by mouth daily.   atorvastatin 10 MG tablet Commonly known as:  LIPITOR Take 1 tablet (10 mg total) by mouth daily.   cetirizine 10 MG tablet Commonly known as:  ZYRTEC Take 1 tablet (10 mg total) by mouth daily.   HYDROcodone-acetaminophen 10-325 MG tablet Commonly known as:  NORCO Take 1 tablet by mouth every 8 (eight) hours as needed.   HYDROcodone-acetaminophen 10-325 MG tablet Commonly known as:  NORCO Take 1 tablet by mouth every 8 (eight) hours as needed.   HYDROcodone-acetaminophen 10-325 MG tablet Commonly known as:  NORCO Take 1 tablet by mouth every 8 (eight) hours as needed.   lisinopril 20 MG tablet Commonly known as:  PRINIVIL,ZESTRIL Take 1 tablet (20 mg total) by mouth daily.   meloxicam 15 MG tablet Commonly known as:  MOBIC TAKE 1 TABLET BY MOUTH EVERY DAY   omeprazole 20 MG capsule Commonly known as:  PRILOSEC TAKE 1 CAPSULE BY MOUTH TWICE DAILY BEFORE MEALS   PROAIR HFA 108 (90 Base) MCG/ACT  inhaler Generic drug:  albuterol Inhale 2 puffs into the lungs every 6 (six) hours as needed for wheezing or shortness of breath.   sertraline 100 MG tablet Commonly known as:  ZOLOFT Take 1 tablet (100 mg total) by mouth daily.          Objective:    BP (!) 156/95   Pulse (!) 56   Temp (!) 96.8 F (36 C) (Oral)   Ht 6' (1.829 m)   Wt 226 lb (102.5 kg)   BMI 30.65 kg/m   No Known Allergies  Wt Readings from Last 3 Encounters:  01/10/19 226 lb (102.5 kg)  01/01/19 227 lb (103 kg)  10/10/18 218 lb 9.6 oz (99.2 kg)    Physical Exam Vitals  signs and nursing note reviewed.  Constitutional:      General: He is not in acute distress.    Appearance: He is well-developed.  HENT:     Head: Normocephalic and atraumatic.  Eyes:     Conjunctiva/sclera: Conjunctivae normal.     Pupils: Pupils are equal, round, and reactive to light.  Cardiovascular:     Rate and Rhythm: Normal rate and regular rhythm.     Heart sounds: Normal heart sounds.  Pulmonary:     Effort: Pulmonary effort is normal. No respiratory distress.     Breath sounds: Normal breath sounds.  Skin:    General: Skin is warm and dry.  Psychiatric:        Behavior: Behavior normal.     Results for orders placed or performed in visit on 02/27/18  CBC with Differential/Platelet  Result Value Ref Range   WBC 6.6 3.4 - 10.8 x10E3/uL   RBC 4.66 4.14 - 5.80 x10E6/uL   Hemoglobin 14.4 13.0 - 17.7 g/dL   Hematocrit 39.8 37.5 - 51.0 %   MCV 85 79 - 97 fL   MCH 30.9 26.6 - 33.0 pg   MCHC 36.2 (H) 31.5 - 35.7 g/dL   RDW 13.5 12.3 - 15.4 %   Platelets 191 150 - 379 x10E3/uL   Neutrophils 60 Not Estab. %   Lymphs 27 Not Estab. %   Monocytes 7 Not Estab. %   Eos 5 Not Estab. %   Basos 1 Not Estab. %   Neutrophils Absolute 4.0 1.4 - 7.0 x10E3/uL   Lymphocytes Absolute 1.8 0.7 - 3.1 x10E3/uL   Monocytes Absolute 0.4 0.1 - 0.9 x10E3/uL   EOS (ABSOLUTE) 0.3 0.0 - 0.4 x10E3/uL   Basophils Absolute 0.1 0.0 - 0.2 x10E3/uL   Immature Granulocytes 0 Not Estab. %   Immature Grans (Abs) 0.0 0.0 - 0.1 x10E3/uL  CMP14+EGFR  Result Value Ref Range   Glucose 95 65 - 99 mg/dL   BUN 15 6 - 24 mg/dL   Creatinine, Ser 0.77 0.76 - 1.27 mg/dL   GFR calc non Af Amer 104 >59 mL/min/1.73   GFR calc Af Amer 121 >59 mL/min/1.73   BUN/Creatinine Ratio 19 9 - 20   Sodium 141 134 - 144 mmol/L   Potassium 4.2 3.5 - 5.2 mmol/L   Chloride 100 96 - 106 mmol/L   CO2 24 20 - 29 mmol/L   Calcium 9.7 8.7 - 10.2 mg/dL   Total Protein 7.1 6.0 - 8.5 g/dL   Albumin 4.6 3.5 - 5.5 g/dL    Globulin, Total 2.5 1.5 - 4.5 g/dL   Albumin/Globulin Ratio 1.8 1.2 - 2.2   Bilirubin Total 0.4 0.0 - 1.2 mg/dL   Alkaline Phosphatase 102 39 -  117 IU/L   AST 29 0 - 40 IU/L   ALT 19 0 - 44 IU/L  Lipid panel  Result Value Ref Range   Cholesterol, Total 150 100 - 199 mg/dL   Triglycerides 158 (H) 0 - 149 mg/dL   HDL 44 >39 mg/dL   VLDL Cholesterol Cal 32 5 - 40 mg/dL   LDL Calculated 74 0 - 99 mg/dL   Chol/HDL Ratio 3.4 0.0 - 5.0 ratio  PSA  Result Value Ref Range   Prostate Specific Ag, Serum 0.2 0.0 - 4.0 ng/mL      Assessment & Plan:   1. Arthritis - HYDROcodone-acetaminophen (NORCO) 10-325 MG tablet; Take 1 tablet by mouth every 8 (eight) hours as needed.  Dispense: 90 tablet; Refill: 0 - HYDROcodone-acetaminophen (NORCO) 10-325 MG tablet; Take 1 tablet by mouth every 8 (eight) hours as needed.  Dispense: 90 tablet; Refill: 0 - HYDROcodone-acetaminophen (NORCO) 10-325 MG tablet; Take 1 tablet by mouth every 8 (eight) hours as needed.  Dispense: 90 tablet; Refill: 0  2. Generalized OA - HYDROcodone-acetaminophen (NORCO) 10-325 MG tablet; Take 1 tablet by mouth every 8 (eight) hours as needed.  Dispense: 90 tablet; Refill: 0 - HYDROcodone-acetaminophen (NORCO) 10-325 MG tablet; Take 1 tablet by mouth every 8 (eight) hours as needed.  Dispense: 90 tablet; Refill: 0 - HYDROcodone-acetaminophen (NORCO) 10-325 MG tablet; Take 1 tablet by mouth every 8 (eight) hours as needed.  Dispense: 90 tablet; Refill: 0  3. Hypertension, essential - amLODipine (NORVASC) 5 MG tablet; Take 1 tablet (5 mg total) by mouth daily.  Dispense: 90 tablet; Refill: 3 - CBC with Differential/Platelet - CMP14+EGFR - Lipid panel - TSH  4. Chronic midline low back pain without sciatica - CBC with Differential/Platelet - CMP14+EGFR - Lipid panel - TSH  5. Gastroesophageal reflux disease without esophagitis - CBC with Differential/Platelet - CMP14+EGFR  6. Well adult exam - CBC with  Differential/Platelet - CMP14+EGFR - Lipid panel - TSH   Continue all other maintenance medications as listed above.  Follow up plan: Return in about 4 weeks (around 02/07/2019).  Educational handout given for [place] PA-C [facility] [address]  [place], [place] [address] [phone]   01/10/2019, 7:19 PM

## 2019-01-11 LAB — LIPID PANEL
CHOL/HDL RATIO: 3 ratio (ref 0.0–5.0)
CHOLESTEROL TOTAL: 130 mg/dL (ref 100–199)
HDL: 43 mg/dL (ref >39)
LDL Calculated: 62 mg/dL (ref 0–99)
TRIGLYCERIDES: 125 mg/dL (ref 0–149)
VLDL Cholesterol Cal: 25 mg/dL (ref 5–40)

## 2019-01-11 LAB — CBC WITH DIFFERENTIAL/PLATELET
BASOS ABS: 0.1 10*3/uL (ref 0.0–0.2)
Basos: 1 %
EOS (ABSOLUTE): 0.3 10*3/uL (ref 0.0–0.4)
Eos: 4 %
Hematocrit: 39.3 % (ref 37.5–51.0)
Hemoglobin: 13.8 g/dL (ref 13.0–17.7)
IMMATURE GRANULOCYTES: 0 %
Immature Grans (Abs): 0 10*3/uL (ref 0.0–0.1)
Lymphocytes Absolute: 1.8 10*3/uL (ref 0.7–3.1)
Lymphs: 29 %
MCH: 30.3 pg (ref 26.6–33.0)
MCHC: 35.1 g/dL (ref 31.5–35.7)
MCV: 86 fL (ref 79–97)
MONOS ABS: 0.5 10*3/uL (ref 0.1–0.9)
Monocytes: 8 %
NEUTROS PCT: 58 %
Neutrophils Absolute: 3.6 10*3/uL (ref 1.4–7.0)
PLATELETS: 194 10*3/uL (ref 150–450)
RBC: 4.56 x10E6/uL (ref 4.14–5.80)
RDW: 13 % (ref 11.6–15.4)
WBC: 6.3 10*3/uL (ref 3.4–10.8)

## 2019-01-11 LAB — CMP14+EGFR
A/G RATIO: 2 (ref 1.2–2.2)
ALT: 16 IU/L (ref 0–44)
AST: 24 IU/L (ref 0–40)
Albumin: 4.8 g/dL (ref 3.8–4.9)
Alkaline Phosphatase: 93 IU/L (ref 39–117)
BUN/Creatinine Ratio: 20 (ref 9–20)
BUN: 17 mg/dL (ref 6–24)
Bilirubin Total: 0.4 mg/dL (ref 0.0–1.2)
CALCIUM: 9.6 mg/dL (ref 8.7–10.2)
CO2: 19 mmol/L — AB (ref 20–29)
CREATININE: 0.85 mg/dL (ref 0.76–1.27)
Chloride: 100 mmol/L (ref 96–106)
GFR calc Af Amer: 115 mL/min/{1.73_m2} (ref >59)
GFR, EST NON AFRICAN AMERICAN: 99 mL/min/{1.73_m2} (ref >59)
Globulin, Total: 2.4 g/dL (ref 1.5–4.5)
Glucose: 80 mg/dL (ref 65–99)
POTASSIUM: 4.3 mmol/L (ref 3.5–5.2)
Sodium: 139 mmol/L (ref 134–144)
Total Protein: 7.2 g/dL (ref 6.0–8.5)

## 2019-01-11 LAB — TSH: TSH: 1.36 u[IU]/mL (ref 0.450–4.500)

## 2019-02-12 ENCOUNTER — Ambulatory Visit (INDEPENDENT_AMBULATORY_CARE_PROVIDER_SITE_OTHER): Payer: Medicare Other | Admitting: Physician Assistant

## 2019-02-12 ENCOUNTER — Encounter: Payer: Self-pay | Admitting: *Deleted

## 2019-02-12 ENCOUNTER — Encounter: Payer: Self-pay | Admitting: Physician Assistant

## 2019-02-12 ENCOUNTER — Ambulatory Visit (INDEPENDENT_AMBULATORY_CARE_PROVIDER_SITE_OTHER): Payer: Medicare Other | Admitting: *Deleted

## 2019-02-12 VITALS — BP 137/88 | HR 62 | Temp 97.9°F | Ht 72.0 in | Wt 223.4 lb

## 2019-02-12 VITALS — BP 137/88 | HR 62 | Ht 72.0 in | Wt 223.0 lb

## 2019-02-12 DIAGNOSIS — I10 Essential (primary) hypertension: Secondary | ICD-10-CM | POA: Diagnosis not present

## 2019-02-12 DIAGNOSIS — G8929 Other chronic pain: Secondary | ICD-10-CM

## 2019-02-12 DIAGNOSIS — Z0000 Encounter for general adult medical examination without abnormal findings: Secondary | ICD-10-CM | POA: Diagnosis not present

## 2019-02-12 DIAGNOSIS — K219 Gastro-esophageal reflux disease without esophagitis: Secondary | ICD-10-CM

## 2019-02-12 DIAGNOSIS — M545 Low back pain, unspecified: Secondary | ICD-10-CM

## 2019-02-12 DIAGNOSIS — R52 Pain, unspecified: Secondary | ICD-10-CM

## 2019-02-12 MED ORDER — NALOXONE HCL 4 MG/0.1ML NA LIQD: 1.0000 | Freq: Once | NASAL | 1 refills | Status: AC

## 2019-02-12 MED ORDER — ESOMEPRAZOLE MAGNESIUM 40 MG PO CPDR: 40.0000 mg | DELAYED_RELEASE_CAPSULE | Freq: Every day | ORAL | 5 refills | Status: DC

## 2019-02-12 MED ORDER — AMLODIPINE BESYLATE 10 MG PO TABS: 10.0000 mg | ORAL_TABLET | Freq: Every day | ORAL | 1 refills | Status: DC

## 2019-02-12 NOTE — Progress Notes (Signed)
BP 137/88   Pulse 62   Temp 97.9 F (36.6 C) (Oral)   Ht 6' (1.829 m)   Wt 223 lb 6.4 oz (101.3 kg)   BMI 30.30 kg/m    Subjective:    Patient ID: Bradley Fisher, male    DOB: Jan 31, 1966, 53 y.o.   MRN: [id number]  HPI: Bradley Fisher is a 53 y.o. male presenting on 02/12/2019 for Hypertension (3 month ) and Medical Management of Chronic Issues His BP has been elevated and we will make adjustment to norvasc. He is tolerating it well.  His GERD is also doing worse. He has burning of the esophagus and is worse at night. He has been on prilosec for a while.  Even at BID she has not has any improvement in the reflux.  Has not been to gastroenterology.  PAIN ASSESSMENT: Cause of pain- DDD and DJD  This patient returns for a 3 month recheck on narcotic use for the above named conditions  Patient currently taking norco 10/325 1 TID for pain. mobic 7.5 mg one daily Behavior- normal Medication side effects- no Any concerns- no  Pain on scale of 1-10- 5 Frequency- daily What increases pain- walking, working What makes pain Better- rest Effects on ADL - mild Any change in general medical condition- no  Effectiveness of current meds- good Adverse reactions form pain meds-no PMP AWARE website reviewed: Yes Any suspicious activity on PMP Aware: No MME daily dose: *30 MME Contract on file Last UDS  01/15/2019  History of overdose or risk of abuse none  Evidence: Lumbar MRI 2007 1.  Left paracentral disc herniation at L4-5 generates stenosis in the left lateral recess and exit foramen at this level.   2. Mild multifactorial canal stenosis at L2-3 and L3-4.  3.  Left foraminal disc herniation at L1-2 without evidence for central canal stenosis.   At his next visit we are going to try Adding gabapentin to help with chronic pain and try to reduce his pain medication to 2 pills a day at max.  We will discuss this at the next visit.    Past Medical History:  Diagnosis Date    . Arthritis   . Asthma   . Depression   . GERD (gastroesophageal reflux disease)   . Hyperlipidemia   . Hypertension   . Seizures ([place])    unknow etiology; on meds for this; no seizures since on meds 10 years ago   Relevant past medical, surgical, family and social history reviewed and updated as indicated. Interim medical history since our last visit reviewed. Allergies and medications reviewed and updated. DATA REVIEWED: CHART IN EPIC  Family History reviewed for pertinent findings.  Review of Systems  Constitutional: Negative.  Negative for appetite change and fatigue.  HENT: Negative.   Eyes: Negative.  Negative for pain and visual disturbance.  Respiratory: Negative.  Negative for cough, chest tightness, shortness of breath and wheezing.   Cardiovascular: Negative.  Negative for chest pain, palpitations and leg swelling.  Gastrointestinal: Negative.  Negative for abdominal pain, diarrhea, nausea and vomiting.  Endocrine: Negative.   Genitourinary: Negative.   Musculoskeletal: Positive for arthralgias, back pain and myalgias.  Skin: Negative.  Negative for color change and rash.  Neurological: Negative.  Negative for weakness, numbness and headaches.  Psychiatric/Behavioral: Negative.     Allergies as of 02/12/2019   No Known Allergies     Medication List       Accurate as of  February 12, 2019  8:57 PM. Always use your most recent med list.        amLODipine 10 MG tablet Commonly known as:  NORVASC Take 1 tablet (10 mg total) by mouth daily.   atorvastatin 10 MG tablet Commonly known as:  LIPITOR Take 1 tablet (10 mg total) by mouth daily.   cetirizine 10 MG tablet Commonly known as:  ZYRTEC Take 1 tablet (10 mg total) by mouth daily.   esomeprazole 40 MG capsule Commonly known as:  NEXIUM Take 1 capsule (40 mg total) by mouth daily at 12 noon.   HYDROcodone-acetaminophen 10-325 MG tablet Commonly known as:  NORCO Take 1 tablet by mouth every 8 (eight) hours  as needed.   HYDROcodone-acetaminophen 10-325 MG tablet Commonly known as:  NORCO Take 1 tablet by mouth every 8 (eight) hours as needed.   HYDROcodone-acetaminophen 10-325 MG tablet Commonly known as:  NORCO Take 1 tablet by mouth every 8 (eight) hours as needed.   lisinopril 20 MG tablet Commonly known as:  PRINIVIL,ZESTRIL Take 1 tablet (20 mg total) by mouth daily.   meloxicam 15 MG tablet Commonly known as:  MOBIC TAKE 1 TABLET BY MOUTH EVERY DAY   naloxone 4 MG/0.1ML Liqd nasal spray kit Commonly known as:  NARCAN Place 1 spray into the nose once for 1 dose.   PROAIR HFA 108 (90 Base) MCG/ACT inhaler Generic drug:  albuterol Inhale 2 puffs into the lungs every 6 (six) hours as needed for wheezing or shortness of breath.   sertraline 100 MG tablet Commonly known as:  ZOLOFT Take 1 tablet (100 mg total) by mouth daily.          Objective:    BP 137/88   Pulse 62   Temp 97.9 F (36.6 C) (Oral)   Ht 6' (1.829 m)   Wt 223 lb 6.4 oz (101.3 kg)   BMI 30.30 kg/m   No Known Allergies  Wt Readings from Last 3 Encounters:  02/12/19 223 lb (101.2 kg)  02/12/19 223 lb 6.4 oz (101.3 kg)  01/10/19 226 lb (102.5 kg)    Physical Exam Vitals signs and nursing note reviewed.  Constitutional:      General: He is not in acute distress.    Appearance: He is well-developed.  HENT:     Head: Normocephalic and atraumatic.  Eyes:     Conjunctiva/sclera: Conjunctivae normal.     Pupils: Pupils are equal, round, and reactive to light.  Cardiovascular:     Rate and Rhythm: Normal rate and regular rhythm.     Heart sounds: Normal heart sounds.  Pulmonary:     Effort: Pulmonary effort is normal. No respiratory distress.     Breath sounds: Normal breath sounds.  Musculoskeletal:     Lumbar back: He exhibits decreased range of motion, tenderness, pain and spasm.  Skin:    General: Skin is warm and dry.  Psychiatric:        Behavior: Behavior normal.     Results for  orders placed or performed in visit on 01/10/19  CBC with Differential/Platelet  Result Value Ref Range   WBC 6.3 3.4 - 10.8 x10E3/uL   RBC 4.56 4.14 - 5.80 x10E6/uL   Hemoglobin 13.8 13.0 - 17.7 g/dL   Hematocrit 39.3 37.5 - 51.0 %   MCV 86 79 - 97 fL   MCH 30.3 26.6 - 33.0 pg   MCHC 35.1 31.5 - 35.7 g/dL   RDW 13.0 11.6 - 15.4 %  Platelets 194 150 - 450 x10E3/uL   Neutrophils 58 Not Estab. %   Lymphs 29 Not Estab. %   Monocytes 8 Not Estab. %   Eos 4 Not Estab. %   Basos 1 Not Estab. %   Neutrophils Absolute 3.6 1.4 - 7.0 x10E3/uL   Lymphocytes Absolute 1.8 0.7 - 3.1 x10E3/uL   Monocytes Absolute 0.5 0.1 - 0.9 x10E3/uL   EOS (ABSOLUTE) 0.3 0.0 - 0.4 x10E3/uL   Basophils Absolute 0.1 0.0 - 0.2 x10E3/uL   Immature Granulocytes 0 Not Estab. %   Immature Grans (Abs) 0.0 0.0 - 0.1 x10E3/uL  CMP14+EGFR  Result Value Ref Range   Glucose 80 65 - 99 mg/dL   BUN 17 6 - 24 mg/dL   Creatinine, Ser 0.85 0.76 - 1.27 mg/dL   GFR calc non Af Amer 99 >59 mL/min/1.73   GFR calc Af Amer 115 >59 mL/min/1.73   BUN/Creatinine Ratio 20 9 - 20   Sodium 139 134 - 144 mmol/L   Potassium 4.3 3.5 - 5.2 mmol/L   Chloride 100 96 - 106 mmol/L   CO2 19 (L) 20 - 29 mmol/L   Calcium 9.6 8.7 - 10.2 mg/dL   Total Protein 7.2 6.0 - 8.5 g/dL   Albumin 4.8 3.8 - 4.9 g/dL   Globulin, Total 2.4 1.5 - 4.5 g/dL   Albumin/Globulin Ratio 2.0 1.2 - 2.2   Bilirubin Total 0.4 0.0 - 1.2 mg/dL   Alkaline Phosphatase 93 39 - 117 IU/L   AST 24 0 - 40 IU/L   ALT 16 0 - 44 IU/L  Lipid panel  Result Value Ref Range   Cholesterol, Total 130 100 - 199 mg/dL   Triglycerides 125 0 - 149 mg/dL   HDL 43 >39 mg/dL   VLDL Cholesterol Cal 25 5 - 40 mg/dL   LDL Calculated 62 0 - 99 mg/dL   Chol/HDL Ratio 3.0 0.0 - 5.0 ratio  TSH  Result Value Ref Range   TSH 1.360 0.450 - 4.500 uIU/mL      Assessment & Plan:   1. Hypertension, essential - amLODipine (NORVASC) 10 MG tablet; Take 1 tablet (10 mg total) by mouth daily.   Dispense: 90 tablet; Refill: 1  2. Gastroesophageal reflux disease without esophagitis - esomeprazole (NEXIUM) 40 MG capsule; Take 1 capsule (40 mg total) by mouth daily at 12 noon.  Dispense: 30 capsule; Refill: 5  3. Chronic midline low back pain without sciatica - ToxASSURE Select 13 (MW), Urine - naloxone (NARCAN) nasal spray 4 mg/0.1 mL; Place 1 spray into the nose once for 1 dose.  Dispense: 1 kit; Refill: 1  4. Encounter for pain management - naloxone (NARCAN) nasal spray 4 mg/0.1 mL; Place 1 spray into the nose once for 1 dose.  Dispense: 1 kit; Refill: 1   Continue all other maintenance medications as listed above.  Follow up plan: Return in about 4 weeks (around 03/12/2019) for recheck BP.  Educational handout given for [place] PA-C [place] [address]  [place], [place] [address] [phone]   02/12/2019, 8:57 PM

## 2019-02-12 NOTE — Progress Notes (Signed)
Subjective:   Bradley Fisher is a 53 y.o. male who presents for a subsequent Medicare Annual Wellness Visit.  Bradley Fisher is disabled.  He enjoys helping friends with home improvement projects and working outside.  He lives at home with his wife, and has no biological children.  He has 2 step sons.  He and his wife have 2 indoor dogs.  His wife provides transporation for him to appointments and to run errands as he does not drive due to history of epilepsy.     Patient Care Team: Theodoro Clock as PCP - General (Physician Assistant) Daneil Dolin, MD as Consulting Physician (Gastroenterology) Carole Civil, MD as Consulting Physician (Orthopedic Surgery)  Hospitalizations, surgeries, and ER visits in previous 12 months No hospitalizations, ER visits, or surgeries this past year.   Review of Systems    Patient reports that his overall health is better compared to last year.  Cardiac Risk Factors include: dyslipidemia;hypertension;male gender;sedentary lifestyle;smoking/ tobacco exposure  All other systems negative       Current Medications (verified) Outpatient Encounter Medications as of 02/12/2019  Medication Sig  . amLODipine (NORVASC) 10 MG tablet Take 1 tablet (10 mg total) by mouth daily.  Marland Kitchen atorvastatin (LIPITOR) 10 MG tablet Take 1 tablet (10 mg total) by mouth daily.  . cetirizine (ZYRTEC) 10 MG tablet Take 1 tablet (10 mg total) by mouth daily.  Marland Kitchen esomeprazole (NEXIUM) 40 MG capsule Take 1 capsule (40 mg total) by mouth daily at 12 noon.  Marland Kitchen HYDROcodone-acetaminophen (NORCO) 10-325 MG tablet Take 1 tablet by mouth every 8 (eight) hours as needed.  Marland Kitchen HYDROcodone-acetaminophen (NORCO) 10-325 MG tablet Take 1 tablet by mouth every 8 (eight) hours as needed.  Marland Kitchen HYDROcodone-acetaminophen (NORCO) 10-325 MG tablet Take 1 tablet by mouth every 8 (eight) hours as needed.  Marland Kitchen lisinopril (PRINIVIL,ZESTRIL) 20 MG tablet Take 1 tablet (20 mg total) by mouth daily.  .  meloxicam (MOBIC) 15 MG tablet TAKE 1 TABLET BY MOUTH EVERY DAY  . PROAIR HFA 108 (90 Base) MCG/ACT inhaler Inhale 2 puffs into the lungs every 6 (six) hours as needed for wheezing or shortness of breath.  . sertraline (ZOLOFT) 100 MG tablet Take 1 tablet (100 mg total) by mouth daily.   No facility-administered encounter medications on file as of 02/12/2019.     Allergies (verified) Patient has no known allergies.   History: Past Medical History:  Diagnosis Date  . Arthritis   . Asthma   . Depression   . GERD (gastroesophageal reflux disease)   . Hyperlipidemia   . Hypertension   . Seizures ([place])    unknow etiology; on meds for this; no seizures since on meds 10 years ago   Past Surgical History:  Procedure Laterality Date  . COLONOSCOPY WITH PROPOFOL N/A 05/29/2018   Procedure: COLONOSCOPY WITH PROPOFOL;  Surgeon: Daneil Dolin, MD;  Location: [facility];  Service: Endoscopy;  Laterality: N/A;  9:45am  . KNEE ARTHROSCOPY WITH MEDIAL MENISECTOMY Left 11/11/2016   Procedure: LEFT KNEE ARTHROSCOPY WITH MEDIAL MENISECTOMY;  Surgeon: Carole Civil, MD;  Location: [facility];  Service: Orthopedics;  Laterality: Left;   Family History  Problem Relation Age of Onset  . Hypertension Mother   . Diabetes Mother   . Hypertension Father   . Diabetes Father   . Diabetes Brother   . Colon cancer Neg Hx    Social History   Socioeconomic History  . Marital status: Married  Spouse name: Not on file  . Number of children: 0  . Years of education: Not on file  . Highest education level: 7th grade  Occupational History  . Not on file  Social Needs  . Financial resource strain: Not hard at all  . Food insecurity:    Worry: Never true    Inability: Never true  . Transportation needs:    Medical: No    Non-medical: No  Tobacco Use  . Smoking status: Current Every Day Smoker    Types: Cigars  . Smokeless tobacco: Current User    Types: Snuff  . Tobacco comment: cigars uses  2 per month  Substance and Sexual Activity  . Alcohol use: Yes    Alcohol/week: 12.0 standard drinks    Types: 12 Cans of beer per week    Comment: drinks some weekends.   . Drug use: No  . Sexual activity: Yes    Birth control/protection: None  Lifestyle  . Physical activity:    Days per week: 0 days    Minutes per session: 0 min  . Stress: Not at all  Relationships  . Social connections:    Talks on phone: Twice a week    Gets together: More than three times a week    Attends religious service: More than 4 times per year    Active member of club or organization: No    Attends meetings of clubs or organizations: Never    Relationship status: Married  Other Topics Concern  . Not on file  Social History Narrative  . Not on file     Clinical Intake:     Pain Score: 0-No pain                  Activities of Daily Living In your present state of health, do you have any difficulty performing the following activities: 02/12/2019 05/23/2018  Hearing? N N  Vision? Y N  Comment Due for eye exam.  Plans to change eye doctors this year and go to [place]  -  Difficulty concentrating or making decisions? Y N  Comment Trouble remember things at times. -  Walking or climbing stairs? N N  Dressing or bathing? N N  Doing errands, shopping? N N  Preparing Food and eating ? N -  Using the Toilet? N -  In the past six months, have you accidently leaked urine? N -  Do you have problems with loss of bowel control? N -  Managing your Medications? N -  Managing your Finances? N -  Housekeeping or managing your Housekeeping? N -  Some recent data might be hidden     Exercise Current Exercise Habits: The patient does not participate in regular exercise at present, Exercise limited by: orthopedic condition(s)  Diet Consumes 3 meals a day and 2 snacks a day.  The patient feels that he mostly follow a Regular diet.  Diet History  Eats mostly home cooked  meals.  Recommended diet of mostly non-starchy vegetables, fruits, lean proteins, and whole grains.  Depression Screen PHQ 2/9 Scores 02/12/2019 02/12/2019 01/10/2019 10/10/2018 07/10/2018 05/24/2018 02/27/2018  PHQ - 2 Score 0 0 0 0 0 0 0  PHQ- 9 Score - - - - - - -     Fall Risk Fall Risk  02/12/2019 02/27/2018 02/10/2017  Falls in the past year? 0 No No     Objective:    Today's Vitals   02/12/19 1003  BP:  137/88  Pulse: 62  Weight: 223 lb (101.2 kg)  Height: 6' (1.829 m)  PainSc: 0-No pain   Body mass index is 30.24 kg/m.  Advanced Directives 02/12/2019 05/29/2018 05/23/2018 11/11/2016 11/08/2016  Does Patient Have a Medical Advance Directive? No No No No No  Does patient want to make changes to medical advance directive? Yes (MAU/Ambulatory/Procedural Areas - Information given) - - - -  Would patient like information on creating a medical advance directive? - No - Patient declined No - Patient declined No - Patient declined No - Patient declined    Hearing/Vision  No hearing or vision deficits noted during visit.  Cognitive Function: MMSE - Mini Mental State Exam 02/12/2019 10/05/2017  Not completed: Unable to complete Unable to complete  Orientation to time - 4  Orientation to Place - 2  Registration - 3  Attention/ Calculation - (No Data)  Attention/Calculation-comments - not attempted  Recall - 2     6CIT Screen 02/12/2019  What Year? 0 points  What month? 3 points  What time? 0 points  Count back from 20 0 points  Repeat phrase 8 points       Immunizations and Health Maintenance Immunization History  Administered Date(s) Administered  . Influenza,inj,Quad PF,6+ Mos 10/27/2016, 09/21/2017, 09/27/2018   Health Maintenance Due  Topic Date Due  . HIV Screening  09/23/1980  . TETANUS/TDAP  09/23/1984   Health Maintenance  Topic Date Due  . HIV Screening  09/23/1980  . TETANUS/TDAP  09/23/1984  . COLONOSCOPY  05/29/2028  . INFLUENZA VACCINE  Completed    Recommend HIV screening at next visit with [name], PA.  Tdap declined today.      Assessment:   This is a routine wellness examination for [place].    Plan:    Goals    . Exercise 150 minutes per week (moderate activity)     Walking is a great option.    . Plan meals     Try to plan meals instead of "grazing" all day        Health Maintenance & Additional Screening Recommendations: Td vaccine Advanced directives: has NO advanced directive  - add't info requested. Referral to SW: no  Lung: Low Dose CT Chest recommended if Age 74-80 years, 30 pack-year currently smoking OR have quit w/in 15years. Patient does not qualify. Hepatitis C Screening recommended: not applicable HIV screening recommended: yes   Keep f/u with Terald Sleeper, PA-C and any other specialty appointments you may have Continue current medications Move carefully to avoid falls. Aim for at least 150 minutes of moderate activity a week.  Read or work on puzzles daily Stay connected with friends and family  I have personally reviewed and noted the following in the patient's chart:   . Medical and social history . Use of alcohol, tobacco or illicit drugs  . Current medications and supplements . Functional ability and status . Nutritional status . Physical activity . Advanced directives . List of other physicians . Hospitalizations, surgeries, and ER visits in previous 12 months . Vitals . Screenings to include cognitive, depression, and falls . Referrals and appointments  In addition, I have reviewed and discussed with patient certain preventive protocols, quality metrics, and best practice recommendations. A written personalized care plan for preventive services as well as general preventive health recommendations were provided to patient.     Nolberto Hanlon, RN  02/12/2019

## 2019-02-12 NOTE — Patient Instructions (Signed)
Please work on your goal of walking 5 times per week for 30 minutes each session.   Please review the information given on Advance Directives.  If you complete the paperwork, please bring a copy to our office to be filed in your medical record.   Please follow up with Angel Jones, PA and any specialist doctors as scheduled.  Thank you for coming in for your Annual Wellness Visit today!   Preventive Care 40-64 Years, Male Preventive care refers to lifestyle choices and visits with your health care provider that can promote health and wellness. What does preventive care include?   A yearly physical exam. This is also called an annual well check.  Dental exams once or twice a year.  Routine eye exams. Ask your health care provider how often you should have your eyes checked.  Personal lifestyle choices, including: ? Daily care of your teeth and gums. ? Regular physical activity. ? Eating a healthy diet. ? Avoiding tobacco and drug use. ? Limiting alcohol use. ? Practicing safe sex. ? Taking low-dose aspirin every day starting at age 50. What happens during an annual well check? The services and screenings done by your health care provider during your annual well check will depend on your age, overall health, lifestyle risk factors, and family history of disease. Counseling Your health care provider may ask you questions about your:  Alcohol use.  Tobacco use.  Drug use.  Emotional well-being.  Home and relationship well-being.  Sexual activity.  Eating habits.  Work and work environment. Screening You may have the following tests or measurements:  Height, weight, and BMI.  Blood pressure.  Lipid and cholesterol levels. These may be checked every 5 years, or more frequently if you are over 50 years old.  Skin check.  Lung cancer screening. You may have this screening every year starting at age 55 if you have a 30-pack-year history of smoking and currently smoke  or have quit within the past 15 years.  Colorectal cancer screening. All adults should have this screening starting at age 50 and continuing until age 75. Your health care provider may recommend screening at age 45. You will have tests every 1-10 years, depending on your results and the type of screening test. People at increased risk should start screening at an earlier age. Screening tests may include: ? Guaiac-based fecal occult blood testing. ? Fecal immunochemical test (FIT). ? Stool DNA test. ? Virtual colonoscopy. ? Sigmoidoscopy. During this test, a flexible tube with a tiny camera (sigmoidoscope) is used to examine your rectum and lower colon. The sigmoidoscope is inserted through your anus into your rectum and lower colon. ? Colonoscopy. During this test, a long, thin, flexible tube with a tiny camera (colonoscope) is used to examine your entire colon and rectum.  Prostate cancer screening. Recommendations will vary depending on your family history and other risks.  Hepatitis C blood test.  Hepatitis B blood test.  Sexually transmitted disease (STD) testing.  Diabetes screening. This is done by checking your blood sugar (glucose) after you have not eaten for a while (fasting). You may have this done every 1-3 years. Discuss your test results, treatment options, and if necessary, the need for more tests with your health care provider. Vaccines Your health care provider may recommend certain vaccines, such as:  Influenza vaccine. This is recommended every year.  Tetanus, diphtheria, and acellular pertussis (Tdap, Td) vaccine. You may need a Td booster every 10 years.  Varicella vaccine. You   may need this if you have not been vaccinated.  Zoster vaccine. You may need this after age 53.  Measles, mumps, and rubella (MMR) vaccine. You may need at least one dose of MMR if you were born in 1957 or later. You may also need a second dose.  Pneumococcal 13-valent conjugate (PCV13)  vaccine. You may need this if you have certain conditions and have not been vaccinated.  Pneumococcal polysaccharide (PPSV23) vaccine. You may need one or two doses if you smoke cigarettes or if you have certain conditions.  Meningococcal vaccine. You may need this if you have certain conditions.  Hepatitis A vaccine. You may need this if you have certain conditions or if you travel or work in places where you may be exposed to hepatitis A.  Hepatitis B vaccine. You may need this if you have certain conditions or if you travel or work in places where you may be exposed to hepatitis B.  Haemophilus influenzae type b (Hib) vaccine. You may need this if you have certain risk factors. Talk to your health care provider about which screenings and vaccines you need and how often you need them. This information is not intended to replace advice given to you by your health care provider. Make sure you discuss any questions you have with your health care provider. Document Released: 12/26/2015 Document Revised: 01/19/2018 Document Reviewed: 09/30/2015 Elsevier Interactive Patient Education  2019 [employer].

## 2019-02-15 LAB — TOXASSURE SELECT 13 (MW), URINE

## 2019-02-26 ENCOUNTER — Other Ambulatory Visit: Payer: Self-pay | Admitting: Physician Assistant

## 2019-02-26 DIAGNOSIS — I10 Essential (primary) hypertension: Secondary | ICD-10-CM

## 2019-03-12 ENCOUNTER — Ambulatory Visit (INDEPENDENT_AMBULATORY_CARE_PROVIDER_SITE_OTHER): Payer: Medicare Other | Admitting: Physician Assistant

## 2019-03-12 ENCOUNTER — Other Ambulatory Visit: Payer: Self-pay

## 2019-03-12 ENCOUNTER — Encounter: Payer: Self-pay | Admitting: Physician Assistant

## 2019-03-12 DIAGNOSIS — M1990 Unspecified osteoarthritis, unspecified site: Secondary | ICD-10-CM | POA: Diagnosis not present

## 2019-03-12 DIAGNOSIS — K219 Gastro-esophageal reflux disease without esophagitis: Secondary | ICD-10-CM | POA: Diagnosis not present

## 2019-03-12 DIAGNOSIS — M159 Polyosteoarthritis, unspecified: Secondary | ICD-10-CM

## 2019-03-12 MED ORDER — HYDROCODONE-ACETAMINOPHEN 10-325 MG PO TABS: 1.0000 | ORAL_TABLET | Freq: Three times a day (TID) | ORAL | 0 refills | Status: DC | PRN

## 2019-03-12 MED ORDER — ESOMEPRAZOLE MAGNESIUM 40 MG PO CPDR: 40.0000 mg | DELAYED_RELEASE_CAPSULE | Freq: Every day | ORAL | 3 refills | Status: DC

## 2019-03-12 NOTE — Progress Notes (Signed)
BP 132/75   Pulse 78   Temp (!) 97.2 F (36.2 C) (Oral)   Ht 6\' 1"  (1.854 m)   Wt 220 lb 9.6 oz (100.1 kg)   BMI 29.10 kg/m    Subjective:    Patient ID: Bradley Fisher, male    DOB: [birth year], 53 y.o.   MRN: [id number]  HPI: Bradley Fisher is a 53 y.o. male presenting on 03/12/2019 for Hypertension (4 week re check) This patient comes in for 4-week recheck on his blood pressure.  He has excellent reading today.  He states he is tolerating his medication well not having any difficulty with the medication.  He has no other complaints at this time.   PAIN ASSESSMENT: Cause of pain- OA, DDD Lumbar MRI 2007 1.  Left paracentral disc herniation at L4-5 generates stenosis in the left lateral recess and exit foramen at this level.   2. Mild multifactorial canal stenosis at L2-3 and L3-4.  3.  Left foraminal disc herniation at L1-2 without evidence for central canal stenosis.   This patient returns for a 3 month recheck on narcotic use for the above named conditions  Current medications-hydrocodone 10/325 1 tablet 3 times a day as needed for severe pain.  He states that some days he does pretty well.  But with being more active he has had to take the medicine pretty consistently over the past couple weeks. Medication side effects-none Any concerns-none  Pain on scale of 1-10-7-8 Frequency-Daily What increases pain-bending and stooping What makes pain Better-rest Effects on ADL -minimal Any change in general medical condition-no  Effectiveness of current meds-good Adverse reactions form pain meds-no PMP AWARE website reviewed: Yes Any suspicious activity on PMP Aware: No MME daily dose: 30  Contract on file Last UDS  02/12/19  History of overdose or risk of abuse no    Past Medical History:  Diagnosis Date  . Arthritis   . Asthma   . Depression   . GERD (gastroesophageal reflux disease)   . Hyperlipidemia   . Hypertension   . Seizures ([place])    unknow etiology;  on meds for this; no seizures since on meds 10 years ago   Relevant past medical, surgical, family and social history reviewed and updated as indicated. Interim medical history since our last visit reviewed. Allergies and medications reviewed and updated. DATA REVIEWED: CHART IN EPIC  Family History reviewed for pertinent findings.  Review of Systems  Constitutional: Negative.  Negative for appetite change and fatigue.  HENT: Negative.   Eyes: Negative.  Negative for pain and visual disturbance.  Respiratory: Negative.  Negative for cough, chest tightness, shortness of breath and wheezing.   Cardiovascular: Negative.  Negative for chest pain, palpitations and leg swelling.  Gastrointestinal: Negative.  Negative for abdominal pain, diarrhea, nausea and vomiting.  Endocrine: Negative.   Genitourinary: Negative.   Musculoskeletal: Positive for arthralgias, back pain, joint swelling and myalgias.  Skin: Negative.  Negative for color change and rash.  Neurological: Negative.  Negative for weakness, numbness and headaches.  Psychiatric/Behavioral: Negative.     Allergies as of 03/12/2019   No Known Allergies     Medication List       Accurate as of March 12, 2019  8:59 AM. Always use your most recent med list.        amLODipine 10 MG tablet Commonly known as:  NORVASC Take 1 tablet (10 mg total) by mouth daily.   atorvastatin 10 MG tablet Commonly known  as:  LIPITOR Take 1 tablet (10 mg total) by mouth daily.   cetirizine 10 MG tablet Commonly known as:  ZYRTEC Take 1 tablet (10 mg total) by mouth daily.   esomeprazole 40 MG capsule Commonly known as:  NEXIUM Take 1 capsule (40 mg total) by mouth daily.   HYDROcodone-acetaminophen 10-325 MG tablet Commonly known as:  NORCO Take 1 tablet by mouth every 8 (eight) hours as needed.   HYDROcodone-acetaminophen 10-325 MG tablet Commonly known as:  NORCO Take 1 tablet by mouth every 8 (eight) hours as needed.    HYDROcodone-acetaminophen 10-325 MG tablet Commonly known as:  NORCO Take 1 tablet by mouth every 8 (eight) hours as needed.   lisinopril 20 MG tablet Commonly known as:  PRINIVIL,ZESTRIL TAKE 1 TABLET DAILY   meloxicam 15 MG tablet Commonly known as:  MOBIC TAKE 1 TABLET BY MOUTH EVERY DAY   ProAir HFA 108 (90 Base) MCG/ACT inhaler Generic drug:  albuterol Inhale 2 puffs into the lungs every 6 (six) hours as needed for wheezing or shortness of breath.   sertraline 100 MG tablet Commonly known as:  ZOLOFT Take 1 tablet (100 mg total) by mouth daily.          Objective:    BP 132/75   Pulse 78   Temp (!) 97.2 F (36.2 C) (Oral)   Ht 6\' 1"  (1.854 m)   Wt 220 lb 9.6 oz (100.1 kg)   BMI 29.10 kg/m   No Known Allergies  Wt Readings from Last 3 Encounters:  03/12/19 220 lb 9.6 oz (100.1 kg)  02/12/19 223 lb (101.2 kg)  02/12/19 223 lb 6.4 oz (101.3 kg)    Physical Exam Vitals signs and nursing note reviewed.  Constitutional:      General: He is not in acute distress.    Appearance: He is well-developed.  HENT:     Head: Normocephalic and atraumatic.  Eyes:     Conjunctiva/sclera: Conjunctivae normal.     Pupils: Pupils are equal, round, and reactive to light.  Cardiovascular:     Rate and Rhythm: Normal rate and regular rhythm.     Heart sounds: Normal heart sounds.  Pulmonary:     Effort: Pulmonary effort is normal. No respiratory distress.     Breath sounds: Normal breath sounds.  Skin:    General: Skin is warm and dry.  Psychiatric:        Behavior: Behavior normal.     Results for orders placed or performed in visit on 02/12/19  ToxASSURE Select 13 (MW), Urine  Result Value Ref Range   Summary FINAL       Assessment & Plan:   1. Gastroesophageal reflux disease without esophagitis - esomeprazole (NEXIUM) 40 MG capsule; Take 1 capsule (40 mg total) by mouth daily.  Dispense: 90 capsule; Refill: 3  2. Arthritis - HYDROcodone-acetaminophen  (NORCO) 10-325 MG tablet; Take 1 tablet by mouth every 8 (eight) hours as needed.  Dispense: 90 tablet; Refill: 0 - HYDROcodone-acetaminophen (NORCO) 10-325 MG tablet; Take 1 tablet by mouth every 8 (eight) hours as needed.  Dispense: 90 tablet; Refill: 0 - HYDROcodone-acetaminophen (NORCO) 10-325 MG tablet; Take 1 tablet by mouth every 8 (eight) hours as needed.  Dispense: 90 tablet; Refill: 0  3. Generalized OA - HYDROcodone-acetaminophen (NORCO) 10-325 MG tablet; Take 1 tablet by mouth every 8 (eight) hours as needed.  Dispense: 90 tablet; Refill: 0 - HYDROcodone-acetaminophen (NORCO) 10-325 MG tablet; Take 1 tablet by mouth every 8 (eight)  hours as needed.  Dispense: 90 tablet; Refill: 0 - HYDROcodone-acetaminophen (NORCO) 10-325 MG tablet; Take 1 tablet by mouth every 8 (eight) hours as needed.  Dispense: 90 tablet; Refill: 0   Continue all other maintenance medications as listed above.  Follow up plan: Return in about 3 months (around 06/12/2019) for recheck.  Educational handout given for [place] PA-C [place] [address]  [place], [place] [address] [phone]   03/12/2019, 8:59 AM

## 2019-03-28 ENCOUNTER — Other Ambulatory Visit: Payer: Self-pay | Admitting: Physician Assistant

## 2019-03-28 DIAGNOSIS — I10 Essential (primary) hypertension: Secondary | ICD-10-CM

## 2019-04-23 ENCOUNTER — Other Ambulatory Visit: Payer: Self-pay | Admitting: Physician Assistant

## 2019-05-16 ENCOUNTER — Other Ambulatory Visit: Payer: Self-pay | Admitting: Physician Assistant

## 2019-05-16 DIAGNOSIS — M545 Low back pain, unspecified: Secondary | ICD-10-CM

## 2019-05-16 DIAGNOSIS — M159 Polyosteoarthritis, unspecified: Secondary | ICD-10-CM

## 2019-05-16 DIAGNOSIS — G8929 Other chronic pain: Secondary | ICD-10-CM

## 2019-05-18 ENCOUNTER — Telehealth: Payer: Self-pay | Admitting: Physician Assistant

## 2019-06-13 ENCOUNTER — Other Ambulatory Visit: Payer: Self-pay

## 2019-06-13 ENCOUNTER — Encounter: Payer: Self-pay | Admitting: Physician Assistant

## 2019-06-13 ENCOUNTER — Ambulatory Visit (INDEPENDENT_AMBULATORY_CARE_PROVIDER_SITE_OTHER): Payer: Medicare Other | Admitting: Physician Assistant

## 2019-06-13 DIAGNOSIS — M1990 Unspecified osteoarthritis, unspecified site: Secondary | ICD-10-CM

## 2019-06-13 DIAGNOSIS — M545 Low back pain: Secondary | ICD-10-CM

## 2019-06-13 DIAGNOSIS — M159 Polyosteoarthritis, unspecified: Secondary | ICD-10-CM

## 2019-06-13 DIAGNOSIS — F339 Major depressive disorder, recurrent, unspecified: Secondary | ICD-10-CM | POA: Diagnosis not present

## 2019-06-13 DIAGNOSIS — G8929 Other chronic pain: Secondary | ICD-10-CM

## 2019-06-13 MED ORDER — HYDROCODONE-ACETAMINOPHEN 10-325 MG PO TABS: 1.0000 | ORAL_TABLET | Freq: Three times a day (TID) | ORAL | 0 refills | Status: DC | PRN

## 2019-06-13 MED ORDER — CETIRIZINE HCL 10 MG PO TABS: 10.0000 mg | ORAL_TABLET | Freq: Every day | ORAL | 3 refills | Status: DC

## 2019-06-13 MED ORDER — GABAPENTIN 100 MG PO CAPS: 100.0000 mg | ORAL_CAPSULE | Freq: Every day | ORAL | 3 refills | Status: DC

## 2019-06-13 MED ORDER — SERTRALINE HCL 100 MG PO TABS: 100.0000 mg | ORAL_TABLET | Freq: Every day | ORAL | 3 refills | Status: DC

## 2019-06-13 MED ORDER — MELOXICAM 15 MG PO TABS: 15.0000 mg | ORAL_TABLET | Freq: Every day | ORAL | 3 refills | Status: DC

## 2019-06-13 NOTE — Progress Notes (Signed)
BP 134/81   Pulse (!) 59   Temp (!) 97.1 F (36.2 C) (Oral)   Ht 6\' 1"  (1.854 m)   Wt 216 lb 3.2 oz (98.1 kg)   BMI 28.52 kg/m    Subjective:    Patient ID: Bradley Fisher, male    DOB: 10-30-1966, 53 y.o.   MRN: [id number]  HPI: Bradley Fisher is a 53 y.o. male presenting on 06/13/2019 for Pain  PAIN ASSESSMENT: Cause of pain- DDD and DJD  This patient returns for a 3 month recheck on narcotic use for the above named conditions  Patient currently taking norco 10/325 1 TID for pain. mobic 7.5 mg one daily Behavior- normal Medication side effects- no Any concerns- no  Pain on scale of 1-10- 5 Frequency- daily What increases pain- walking, working What makes pain Better- rest Effects on ADL - mild Any change in general medical condition- no  Effectiveness of current meds- good Adverse reactions form pain meds-no PMP AWARE website reviewed: Yes Any suspicious activity on PMP Aware: No MME daily dose: 30 MME Contract on file 10/10/18 Last UDS  01/15/2019  History of overdose or risk of abuse none   He does need refills on Zoloft also.  He states that overall his depression has been doing very well. Depression screen [facility] 2/9 06/13/2019 03/12/2019 02/12/2019 02/12/2019 01/10/2019  Decreased Interest 0 0 0 0 0  Down, Depressed, Hopeless 0 0 0 0 0  PHQ - 2 Score 0 0 0 0 0  Altered sleeping - - - - -  Tired, decreased energy - - - - -  Change in appetite - - - - -  Feeling bad or failure about yourself  - - - - -  Trouble concentrating - - - - -  Moving slowly or fidgety/restless - - - - -  Suicidal thoughts - - - - -  PHQ-9 Score - - - - -     Past Medical History:  Diagnosis Date  . Arthritis   . Asthma   . Depression   . GERD (gastroesophageal reflux disease)   . Hyperlipidemia   . Hypertension   . Seizures ([place])    unknow etiology; on meds for this; no seizures since on meds 10 years ago   Relevant past medical, surgical, family and social  history reviewed and updated as indicated. Interim medical history since our last visit reviewed. Allergies and medications reviewed and updated. DATA REVIEWED: CHART IN EPIC  Family History reviewed for pertinent findings.  Review of Systems  Constitutional: Negative.  Negative for appetite change and fatigue.  Eyes: Negative for pain and visual disturbance.  Respiratory: Negative.  Negative for cough, chest tightness, shortness of breath and wheezing.   Cardiovascular: Negative.  Negative for chest pain, palpitations and leg swelling.  Gastrointestinal: Negative.  Negative for abdominal pain, diarrhea, nausea and vomiting.  Genitourinary: Negative.   Skin: Negative.  Negative for color change and rash.  Neurological: Negative.  Negative for weakness, numbness and headaches.  Psychiatric/Behavioral: Negative.     Allergies as of 06/13/2019   No Known Allergies     Medication List       Accurate as of June 13, 2019 11:59 PM. If you have any questions, ask your nurse or doctor.        amLODipine 10 MG tablet Commonly known as: NORVASC Take 1 tablet (10 mg total) by mouth daily.   atorvastatin 10 MG tablet Commonly known as: LIPITOR  Take 1 tablet (10 mg total) by mouth daily.   cetirizine 10 MG tablet Commonly known as: ZYRTEC Take 1 tablet (10 mg total) by mouth daily.   esomeprazole 40 MG capsule Commonly known as: NEXIUM Take 1 capsule (40 mg total) by mouth daily.   gabapentin 100 MG capsule Commonly known as: NEURONTIN Take 1-3 capsules (100-300 mg total) by mouth at bedtime. For PAIN Started by: Terald Sleeper, PA-C   HYDROcodone-acetaminophen 10-325 MG tablet Commonly known as: NORCO Take 1 tablet by mouth every 8 (eight) hours as needed.   HYDROcodone-acetaminophen 10-325 MG tablet Commonly known as: NORCO Take 1 tablet by mouth every 8 (eight) hours as needed.   HYDROcodone-acetaminophen 10-325 MG tablet Commonly known as: NORCO Take 1 tablet by mouth  every 8 (eight) hours as needed.   lisinopril 20 MG tablet Commonly known as: ZESTRIL TAKE 1 TABLET DAILY   meloxicam 15 MG tablet Commonly known as: MOBIC Take 1 tablet (15 mg total) by mouth daily.   ProAir HFA 108 (90 Base) MCG/ACT inhaler Generic drug: albuterol Inhale 2 puffs into the lungs every 6 (six) hours as needed for wheezing or shortness of breath.   sertraline 100 MG tablet Commonly known as: ZOLOFT Take 1 tablet (100 mg total) by mouth daily.          Objective:    BP 134/81   Pulse (!) 59   Temp (!) 97.1 F (36.2 C) (Oral)   Ht 6\' 1"  (1.854 m)   Wt 216 lb 3.2 oz (98.1 kg)   BMI 28.52 kg/m   No Known Allergies  Wt Readings from Last 3 Encounters:  06/13/19 216 lb 3.2 oz (98.1 kg)  03/12/19 220 lb 9.6 oz (100.1 kg)  02/12/19 223 lb (101.2 kg)    Physical Exam Vitals signs and nursing note reviewed.  Constitutional:      General: He is not in acute distress.    Appearance: He is well-developed.  HENT:     Head: Normocephalic and atraumatic.  Eyes:     Conjunctiva/sclera: Conjunctivae normal.     Pupils: Pupils are equal, round, and reactive to light.  Cardiovascular:     Rate and Rhythm: Normal rate and regular rhythm.     Heart sounds: Normal heart sounds.  Pulmonary:     Effort: Pulmonary effort is normal. No respiratory distress.     Breath sounds: Normal breath sounds.  Skin:    General: Skin is warm and dry.  Psychiatric:        Behavior: Behavior normal.     Results for orders placed or performed in visit on 02/12/19  ToxASSURE Select 13 (MW), Urine  Result Value Ref Range   Summary FINAL       Assessment & Plan:   1. Arthritis - HYDROcodone-acetaminophen (NORCO) 10-325 MG tablet; Take 1 tablet by mouth every 8 (eight) hours as needed.  Dispense: 90 tablet; Refill: 0 - HYDROcodone-acetaminophen (NORCO) 10-325 MG tablet; Take 1 tablet by mouth every 8 (eight) hours as needed.  Dispense: 90 tablet; Refill: 0 -  HYDROcodone-acetaminophen (NORCO) 10-325 MG tablet; Take 1 tablet by mouth every 8 (eight) hours as needed.  Dispense: 90 tablet; Refill: 0  2. Generalized OA - HYDROcodone-acetaminophen (NORCO) 10-325 MG tablet; Take 1 tablet by mouth every 8 (eight) hours as needed.  Dispense: 90 tablet; Refill: 0 - HYDROcodone-acetaminophen (NORCO) 10-325 MG tablet; Take 1 tablet by mouth every 8 (eight) hours as needed.  Dispense: 90 tablet; Refill: 0 -  HYDROcodone-acetaminophen (NORCO) 10-325 MG tablet; Take 1 tablet by mouth every 8 (eight) hours as needed.  Dispense: 90 tablet; Refill: 0 - meloxicam (MOBIC) 15 MG tablet; Take 1 tablet (15 mg total) by mouth daily.  Dispense: 90 tablet; Refill: 3  3. Chronic midline low back pain without sciatica - meloxicam (MOBIC) 15 MG tablet; Take 1 tablet (15 mg total) by mouth daily.  Dispense: 90 tablet; Refill: 3  4. Depression, recurrent ([place]) - sertraline (ZOLOFT) 100 MG tablet; Take 1 tablet (100 mg total) by mouth daily.  Dispense: 90 tablet; Refill: 3   Continue all other maintenance medications as listed above.  Follow up plan: Return in about 3 months (around 09/13/2019) for follow up pain meds.  Educational handout given for [place] PA-C [place] [address]  [place], [place] [address] [phone]   06/17/2019, 9:56 PM

## 2019-08-17 ENCOUNTER — Other Ambulatory Visit: Payer: Self-pay | Admitting: Physician Assistant

## 2019-08-17 DIAGNOSIS — I10 Essential (primary) hypertension: Secondary | ICD-10-CM

## 2019-09-14 ENCOUNTER — Ambulatory Visit: Payer: Medicare Other | Admitting: Physician Assistant

## 2019-09-17 ENCOUNTER — Encounter: Payer: Self-pay | Admitting: Physician Assistant

## 2019-09-17 ENCOUNTER — Other Ambulatory Visit: Payer: Self-pay

## 2019-09-17 ENCOUNTER — Ambulatory Visit (INDEPENDENT_AMBULATORY_CARE_PROVIDER_SITE_OTHER): Payer: Medicare Other | Admitting: Physician Assistant

## 2019-09-17 VITALS — BP 127/79 | HR 51 | Temp 98.4°F | Ht 73.0 in | Wt 214.0 lb

## 2019-09-17 DIAGNOSIS — M159 Polyosteoarthritis, unspecified: Secondary | ICD-10-CM

## 2019-09-17 DIAGNOSIS — I10 Essential (primary) hypertension: Secondary | ICD-10-CM

## 2019-09-17 DIAGNOSIS — M1990 Unspecified osteoarthritis, unspecified site: Secondary | ICD-10-CM | POA: Diagnosis not present

## 2019-09-17 DIAGNOSIS — Z23 Encounter for immunization: Secondary | ICD-10-CM

## 2019-09-17 DIAGNOSIS — E7800 Pure hypercholesterolemia, unspecified: Secondary | ICD-10-CM

## 2019-09-17 MED ORDER — HYDROCODONE-ACETAMINOPHEN 10-325 MG PO TABS: 1.0000 | ORAL_TABLET | Freq: Three times a day (TID) | ORAL | 0 refills | Status: DC | PRN

## 2019-09-17 MED ORDER — HYDROCORTISONE 0.5 % EX CREA: 1.0000 "application " | TOPICAL_CREAM | Freq: Two times a day (BID) | CUTANEOUS | 0 refills | Status: DC

## 2019-09-17 NOTE — Progress Notes (Signed)
BP 127/79   Pulse (!) 51   Temp 98.4 F (36.9 C) (Temporal)   Ht _0  (1.854 m)   Wt 214 lb (97.1 kg)   SpO2 99%   BMI 28.23 kg/m    Subjective:    Patient ID: Bradley Fisher, male    DOB: [birth year], 53 y.o.   MRN: [id number]  HPI: Bradley Fisher is a 53 y.o. male presenting on 09/17/2019 for Hypertension (3 month ) and Pain  PAIN ASSESSMENT: Cause of pain-DDD and DJD  This patient returns for a 3 month recheck on narcotic use for the above named conditions  Patient currently takingnorco 10/325 1 TID for pain. mobic 7.5 mg one daily Behavior-normal Medication side effects-no Any concerns-no  Pain on scale of 1-10-5 Frequency-daily What increases pain-walking, working What makes pain Better-rest Effects on ADL -mild Any change in general medical condition-no  Effectiveness of current meds-good Adverse reactions form pain meds-no PMP AWARE website reviewed:Yes Any suspicious activity on PMP Aware:No MME daily dose: 30 MME Contract on file 10/10/18 Last UDS2/02/2019  History of overdose or risk of abusenone  Past Medical History:  Diagnosis Date  . Arthritis   . Asthma   . Depression   . GERD (gastroesophageal reflux disease)   . Hyperlipidemia   . Hypertension   . Seizures ([place])    unknow etiology; on meds for this; no seizures since on meds 10 years ago   Relevant past medical, surgical, family and social history reviewed and updated as indicated. Interim medical history since our last visit reviewed. Allergies and medications reviewed and updated. DATA REVIEWED: CHART IN EPIC  Family History reviewed for pertinent findings.  Review of Systems  Constitutional: Negative.  Negative for appetite change and fatigue.  Eyes: Negative for pain and visual disturbance.  Respiratory: Negative.  Negative for cough, chest tightness, shortness of breath and wheezing.   Cardiovascular: Negative.  Negative for chest pain, palpitations and  leg swelling.  Gastrointestinal: Negative.  Negative for abdominal pain, diarrhea, nausea and vomiting.  Genitourinary: Negative.   Skin: Negative.  Negative for color change and rash.  Neurological: Negative.  Negative for weakness, numbness and headaches.  Psychiatric/Behavioral: Negative.     Allergies as of 09/17/2019   No Known Allergies     Medication List       Accurate as of September 17, 2019  8:16 AM. If you have any questions, ask your nurse or doctor.        amLODipine 10 MG tablet Commonly known as: NORVASC Take 1 tablet (10 mg total) by mouth daily.   atorvastatin 10 MG tablet Commonly known as: LIPITOR Take 1 tablet (10 mg total) by mouth daily.   cetirizine 10 MG tablet Commonly known as: ZYRTEC Take 1 tablet (10 mg total) by mouth daily.   esomeprazole 40 MG capsule Commonly known as: NEXIUM Take 1 capsule (40 mg total) by mouth daily.   gabapentin 100 MG capsule Commonly known as: NEURONTIN Take 1-3 capsules (100-300 mg total) by mouth at bedtime. For PAIN   HYDROcodone-acetaminophen 10-325 MG tablet Commonly known as: NORCO Take 1 tablet by mouth every 8 (eight) hours as needed.   HYDROcodone-acetaminophen 10-325 MG tablet Commonly known as: NORCO Take 1 tablet by mouth every 8 (eight) hours as needed.   HYDROcodone-acetaminophen 10-325 MG tablet Commonly known as: NORCO Take 1 tablet by mouth every 8 (eight) hours as needed.   lisinopril 20 MG tablet Commonly known as: ZESTRIL TAKE 1 TABLET  DAILY   meloxicam 15 MG tablet Commonly known as: MOBIC Take 1 tablet (15 mg total) by mouth daily.   ProAir HFA 108 (90 Base) MCG/ACT inhaler Generic drug: albuterol Inhale 2 puffs into the lungs every 6 (six) hours as needed for wheezing or shortness of breath.   sertraline 100 MG tablet Commonly known as: ZOLOFT Take 1 tablet (100 mg total) by mouth daily.          Objective:    BP 127/79   Pulse (!) 51   Temp 98.4 F (36.9 C)  (Temporal)   Ht _0  (1.854 m)   Wt 214 lb (97.1 kg)   SpO2 99%   BMI 28.23 kg/m   No Known Allergies  Wt Readings from Last 3 Encounters:  09/17/19 214 lb (97.1 kg)  06/13/19 216 lb 3.2 oz (98.1 kg)  03/12/19 220 lb 9.6 oz (100.1 kg)    Physical Exam Vitals signs and nursing note reviewed.  Constitutional:      General: He is not in acute distress.    Appearance: He is well-developed.  HENT:     Head: Normocephalic and atraumatic.  Eyes:     Conjunctiva/sclera: Conjunctivae normal.     Pupils: Pupils are equal, round, and reactive to light.  Cardiovascular:     Rate and Rhythm: Normal rate and regular rhythm.     Heart sounds: Normal heart sounds.  Pulmonary:     Effort: Pulmonary effort is normal. No respiratory distress.     Breath sounds: Normal breath sounds.  Skin:    General: Skin is warm and dry.  Psychiatric:        Behavior: Behavior normal.     Results for orders placed or performed in visit on 02/12/19  ToxASSURE Select 13 (MW), Urine  Result Value Ref Range   Summary FINAL       Assessment & Plan:   1. Arthritis - HYDROcodone-acetaminophen (NORCO) 10-325 MG tablet; Take 1 tablet by mouth every 8 (eight) hours as needed.  Dispense: 90 tablet; Refill: 0 - HYDROcodone-acetaminophen (NORCO) 10-325 MG tablet; Take 1 tablet by mouth every 8 (eight) hours as needed.  Dispense: 90 tablet; Refill: 0 - HYDROcodone-acetaminophen (NORCO) 10-325 MG tablet; Take 1 tablet by mouth every 8 (eight) hours as needed.  Dispense: 90 tablet; Refill: 0  2. Generalized OA - HYDROcodone-acetaminophen (NORCO) 10-325 MG tablet; Take 1 tablet by mouth every 8 (eight) hours as needed.  Dispense: 90 tablet; Refill: 0 - HYDROcodone-acetaminophen (NORCO) 10-325 MG tablet; Take 1 tablet by mouth every 8 (eight) hours as needed.  Dispense: 90 tablet; Refill: 0 - HYDROcodone-acetaminophen (NORCO) 10-325 MG tablet; Take 1 tablet by mouth every 8 (eight) hours as needed.  Dispense: 90  tablet; Refill: 0  3. Hypertension, essential - CMP14+EGFR - Lipid panel  4. Elevated cholesterol  - CMP14+EGFR - Lipid panel   Continue all other maintenance medications as listed above.  Follow up plan: No follow-ups on file.  Educational handout given for [place] PA-C [place] [address]  [place], [place] [address] [phone]   09/17/2019, 8:16 AM

## 2019-09-18 LAB — CMP14+EGFR
ALT: 19 IU/L (ref 0–44)
AST: 34 IU/L (ref 0–40)
Albumin/Globulin Ratio: 2 (ref 1.2–2.2)
Albumin: 4.9 g/dL (ref 3.8–4.9)
Alkaline Phosphatase: 116 IU/L (ref 39–117)
BUN/Creatinine Ratio: 17 (ref 9–20)
BUN: 15 mg/dL (ref 6–24)
Bilirubin Total: 0.6 mg/dL (ref 0.0–1.2)
CO2: 23 mmol/L (ref 20–29)
Calcium: 9.9 mg/dL (ref 8.7–10.2)
Chloride: 100 mmol/L (ref 96–106)
Creatinine, Ser: 0.9 mg/dL (ref 0.76–1.27)
GFR calc Af Amer: 112 mL/min/{1.73_m2} (ref >59)
GFR calc non Af Amer: 97 mL/min/{1.73_m2} (ref >59)
Globulin, Total: 2.5 g/dL (ref 1.5–4.5)
Glucose: 95 mg/dL (ref 65–99)
Potassium: 4.4 mmol/L (ref 3.5–5.2)
Sodium: 137 mmol/L (ref 134–144)
Total Protein: 7.4 g/dL (ref 6.0–8.5)

## 2019-09-18 LAB — LIPID PANEL
Chol/HDL Ratio: 3.8 ratio (ref 0.0–5.0)
Cholesterol, Total: 141 mg/dL (ref 100–199)
HDL: 37 mg/dL — ABNORMAL LOW (ref >39)
LDL Chol Calc (NIH): 77 mg/dL (ref 0–99)
Triglycerides: 156 mg/dL — ABNORMAL HIGH (ref 0–149)
VLDL Cholesterol Cal: 27 mg/dL (ref 5–40)

## 2019-11-15 ENCOUNTER — Other Ambulatory Visit: Payer: Self-pay | Admitting: Physician Assistant

## 2019-11-15 DIAGNOSIS — I10 Essential (primary) hypertension: Secondary | ICD-10-CM

## 2019-11-29 ENCOUNTER — Other Ambulatory Visit: Payer: Self-pay | Admitting: Physician Assistant

## 2019-11-29 DIAGNOSIS — I10 Essential (primary) hypertension: Secondary | ICD-10-CM

## 2019-12-17 ENCOUNTER — Other Ambulatory Visit: Payer: Self-pay

## 2019-12-17 ENCOUNTER — Other Ambulatory Visit: Payer: Self-pay | Admitting: Physician Assistant

## 2019-12-17 NOTE — Progress Notes (Signed)
Acute Office Visit  Subjective:    Patient ID: Bradley Fisher, male    DOB: [birth year], 54 y.o.   MRN: [id number]  Chief Complaint  Patient presents with  . Pain    Arthritis Presents for follow-up visit. He complains of pain, stiffness and joint swelling. The symptoms have been stable. Affected locations include the right knee, left knee and right shoulder.  Back Pain This is a chronic problem. The current episode started more than 1 year ago. The problem occurs daily. The problem has been waxing and waning since onset. The pain is present in the lumbar spine. The quality of the pain is described as burning. The pain is moderate. The pain is the same all the time. The symptoms are aggravated by bending. Stiffness is present in the morning.    PAIN ASSESSMENT: Cause of pain-DDD and DJD  This patient returns for a 3 month recheck on narcotic use for the above named conditions  Patient currently takingnorco 10/325 1 TID for pain. Gabapentin 400 mg BID pain mobic 7.5 mg one daily Behavior-normal Medication side effects-no Any concerns-no  Pain on scale of 1-10-5 Frequency-daily What increases pain-walking, working What makes pain Better-rest Effects on ADL -mild Any change in general medical condition-no  Effectiveness of current meds-good Adverse reactions form pain meds-no PMP AWARE website reviewed:Yes Any suspicious activity on PMP Aware:No MME daily dose: 30 MME Contract on file10/5/20 Last UDS2/02/2019  History of overdose or risk of abusenone Past Medical History:  Diagnosis Date  . Arthritis   . Asthma   . Depression   . GERD (gastroesophageal reflux disease)   . Hyperlipidemia   . Hypertension   . Seizures ([place])    unknow etiology; on meds for this; no seizures since on meds 10 years ago    Past Surgical History:  Procedure Laterality Date  . COLONOSCOPY WITH PROPOFOL N/A 05/29/2018   Procedure: COLONOSCOPY WITH PROPOFOL;   Surgeon: Daneil Dolin, MD;  Location: [facility];  Service: Endoscopy;  Laterality: N/A;  9:45am  . KNEE ARTHROSCOPY WITH MEDIAL MENISECTOMY Left 11/11/2016   Procedure: LEFT KNEE ARTHROSCOPY WITH MEDIAL MENISECTOMY;  Surgeon: Carole Civil, MD;  Location: [facility];  Service: Orthopedics;  Laterality: Left;    Family History  Problem Relation Age of Onset  . Hypertension Mother   . Diabetes Mother   . Hypertension Father   . Diabetes Father   . Diabetes Brother   . Colon cancer Neg Hx     Social History   Socioeconomic History  . Marital status: Married    Spouse name: Not on file  . Number of children: 0  . Years of education: Not on file  . Highest education level: 7th grade  Occupational History  . Not on file  Tobacco Use  . Smoking status: Current Some Day Smoker    Types: Cigars  . Smokeless tobacco: Current User    Types: Snuff  . Tobacco comment: cigars uses 2 per month  Substance and Sexual Activity  . Alcohol use: Yes    Alcohol/week: 12.0 standard drinks    Types: 12 Cans of beer per week    Comment: drinks some weekends.   . Drug use: No  . Sexual activity: Yes    Birth control/protection: None  Other Topics Concern  . Not on file  Social History Narrative  . Not on file   Social Determinants of Health   Financial Resource Strain: Low Risk   .  Difficulty of Paying Living Expenses: Not hard at all  Food Insecurity: No Food Insecurity  . Worried About Charity fundraiser in the Last Year: Never true  . Ran Out of Food in the Last Year: Never true  Transportation Needs: No Transportation Needs  . Lack of Transportation (Medical): No  . Lack of Transportation (Non-Medical): No  Physical Activity: Inactive  . Days of Exercise per Week: 0 days  . Minutes of Exercise per Session: 0 min  Stress: No Stress Concern Present  . Feeling of Stress : Not at all  Social Connections: Slightly Isolated  . Frequency of Communication with Friends and  Family: Twice a week  . Frequency of Social Gatherings with Friends and Family: More than three times a week  . Attends Religious Services: More than 4 times per year  . Active Member of Clubs or Organizations: No  . Attends Archivist Meetings: Never  . Marital Status: Married  Human resources officer Violence: Not At Risk  . Fear of Current or Ex-Partner: No  . Emotionally Abused: No  . Physically Abused: No  . Sexually Abused: No    Outpatient Medications Prior to Visit  Medication Sig Dispense Refill  . cetirizine (ZYRTEC) 10 MG tablet Take 1 tablet (10 mg total) by mouth daily. 90 tablet 3  . esomeprazole (NEXIUM) 40 MG capsule Take 1 capsule (40 mg total) by mouth daily. 90 capsule 3  . hydrocortisone cream 0.5 % Apply 1 application topically 2 (two) times daily. 30 g 0  . lisinopril (ZESTRIL) 20 MG tablet TAKE 1 TABLET DAILY 90 tablet 1  . meloxicam (MOBIC) 15 MG tablet Take 1 tablet (15 mg total) by mouth daily. 90 tablet 3  . PROAIR HFA 108 (90 Base) MCG/ACT inhaler Inhale 2 puffs into the lungs every 6 (six) hours as needed for wheezing or shortness of breath. 18 g 1  . sertraline (ZOLOFT) 100 MG tablet Take 1 tablet (100 mg total) by mouth daily. 90 tablet 3  . amLODipine (NORVASC) 10 MG tablet Take 1 tablet (10 mg total) by mouth daily. 90 tablet 0  . atorvastatin (LIPITOR) 10 MG tablet Take 1 tablet (10 mg total) by mouth daily. 90 tablet 3  . gabapentin (NEURONTIN) 100 MG capsule Take 1-3 capsules (100-300 mg total) by mouth at bedtime. For PAIN 90 capsule 3  . HYDROcodone-acetaminophen (NORCO) 10-325 MG tablet Take 1 tablet by mouth every 8 (eight) hours as needed. 90 tablet 0  . HYDROcodone-acetaminophen (NORCO) 10-325 MG tablet Take 1 tablet by mouth every 8 (eight) hours as needed. 90 tablet 0  . HYDROcodone-acetaminophen (NORCO) 10-325 MG tablet Take 1 tablet by mouth every 8 (eight) hours as needed. 90 tablet 0   No facility-administered medications prior to visit.     No Known Allergies  Review of Systems  Musculoskeletal: Positive for arthritis, back pain, joint swelling and stiffness.       Objective:    Physical Exam Vitals and nursing note reviewed.  Constitutional:      General: He is not in acute distress.    Appearance: He is well-developed.  HENT:     Head: Normocephalic and atraumatic.  Eyes:     Conjunctiva/sclera: Conjunctivae normal.     Pupils: Pupils are equal, round, and reactive to light.  Cardiovascular:     Rate and Rhythm: Normal rate and regular rhythm.     Heart sounds: Normal heart sounds.  Pulmonary:     Effort: Pulmonary effort  is normal. No respiratory distress.     Breath sounds: Normal breath sounds.  Skin:    General: Skin is warm and dry.  Psychiatric:        Behavior: Behavior normal.     BP 114/75   Pulse 80   Temp (!) 97.4 F (36.3 C) (Temporal)   Ht 6\' 1"  (1.854 m)   Wt 216 lb 12.8 oz (98.3 kg)   SpO2 98%   BMI 28.60 kg/m  Wt Readings from Last 3 Encounters:  12/18/19 216 lb 12.8 oz (98.3 kg)  09/17/19 214 lb (97.1 kg)  06/13/19 216 lb 3.2 oz (98.1 kg)    Health Maintenance Due  Topic Date Due  . HIV Screening  09/23/1980    There are no preventive care reminders to display for this patient.   Lab Results  Component Value Date   TSH 1.360 01/10/2019   Lab Results  Component Value Date   WBC 6.3 01/10/2019   HGB 13.8 01/10/2019   HCT 39.3 01/10/2019   MCV 86 01/10/2019   PLT 194 01/10/2019   Lab Results  Component Value Date   NA 137 09/17/2019   K 4.4 09/17/2019   CO2 23 09/17/2019   GLUCOSE 95 09/17/2019   BUN 15 09/17/2019   CREATININE 0.90 09/17/2019   BILITOT 0.6 09/17/2019   ALKPHOS 116 09/17/2019   AST 34 09/17/2019   ALT 19 09/17/2019   PROT 7.4 09/17/2019   ALBUMIN 4.9 09/17/2019   CALCIUM 9.9 09/17/2019   ANIONGAP 9 11/08/2016   Lab Results  Component Value Date   CHOL 141 09/17/2019   Lab Results  Component Value Date   HDL 37 (L) 09/17/2019     Lab Results  Component Value Date   LDLCALC 77 09/17/2019   Lab Results  Component Value Date   TRIG 156 (H) 09/17/2019   Lab Results  Component Value Date   CHOLHDL 3.8 09/17/2019   No results found for: HGBA1C     Assessment & Plan:   Problem List Items Addressed This Visit      Cardiovascular and Mediastinum   Hypertension, essential   Relevant Medications   amLODipine (NORVASC) 10 MG tablet   atorvastatin (LIPITOR) 10 MG tablet     Musculoskeletal and Integument   Generalized OA   Relevant Medications   gabapentin (NEURONTIN) 400 MG capsule   HYDROcodone-acetaminophen (NORCO) 10-325 MG tablet   HYDROcodone-acetaminophen (NORCO) 10-325 MG tablet   HYDROcodone-acetaminophen (NORCO) 10-325 MG tablet   Other Relevant Orders   DRUG SCREEN-TOXASSURE   Arthritis   Relevant Medications   gabapentin (NEURONTIN) 400 MG capsule   HYDROcodone-acetaminophen (NORCO) 10-325 MG tablet   HYDROcodone-acetaminophen (NORCO) 10-325 MG tablet   HYDROcodone-acetaminophen (NORCO) 10-325 MG tablet    Other Visit Diagnoses    Elevated cholesterol    -  Primary   Relevant Medications   amLODipine (NORVASC) 10 MG tablet   atorvastatin (LIPITOR) 10 MG tablet       Meds ordered this encounter  Medications  . amLODipine (NORVASC) 10 MG tablet    Sig: Take 1 tablet (10 mg total) by mouth daily.    Dispense:  90 tablet    Refill:  3    Order Specific Question:   Supervising Provider    Answer:   Janora Norlander [id number]  . atorvastatin (LIPITOR) 10 MG tablet    Sig: Take 1 tablet (10 mg total) by mouth daily.    Dispense:  90 tablet  Refill:  3    Order Specific Question:   Supervising Provider    Answer:   Janora Norlander [id number]  . gabapentin (NEURONTIN) 400 MG capsule    Sig: Take 1 capsule (400 mg total) by mouth at bedtime. For PAIN    Dispense:  180 capsule    Refill:  3    Order Specific Question:   Supervising Provider    Answer:   Janora Norlander  [id number]  . HYDROcodone-acetaminophen (NORCO) 10-325 MG tablet    Sig: Take 1 tablet by mouth every 8 (eight) hours as needed.    Dispense:  90 tablet    Refill:  0    Fill 60 days from original script date    Order Specific Question:   Supervising Provider    Answer:   Janora Norlander [id number]  . HYDROcodone-acetaminophen (NORCO) 10-325 MG tablet    Sig: Take 1 tablet by mouth every 8 (eight) hours as needed.    Dispense:  90 tablet    Refill:  0    Fill 30 days from original script date    Order Specific Question:   Supervising Provider    Answer:   Janora Norlander [id number]  . HYDROcodone-acetaminophen (NORCO) 10-325 MG tablet    Sig: Take 1 tablet by mouth every 8 (eight) hours as needed.    Dispense:  90 tablet    Refill:  0    Order Specific Question:   Supervising Provider    Answer:   Janora Norlander [id number]     Terald Sleeper, PA-C mobic 7.5 mg one daily Behavior-normal Medication side effects-no Any concerns-no  Pain on scale of 1-10-5 Frequency-daily What increases pain-walking, working What makes pain Better-rest Effects on ADL -mild Any change in general medical condition-no  Effectiveness of current meds-good Adverse reactions form pain meds-no PMP AWARE website reviewed:Yes Any suspicious activity on PMP Aware:No MME daily dose: 30 MME Contract on file10/5/20 Last UDS2/02/2019  History of overdose or risk of abusenone

## 2019-12-18 ENCOUNTER — Encounter: Payer: Self-pay | Admitting: Physician Assistant

## 2019-12-18 ENCOUNTER — Ambulatory Visit (INDEPENDENT_AMBULATORY_CARE_PROVIDER_SITE_OTHER): Payer: Medicare Other | Admitting: Physician Assistant

## 2019-12-18 VITALS — BP 114/75 | HR 80 | Temp 97.4°F | Ht 73.0 in | Wt 216.8 lb

## 2019-12-18 DIAGNOSIS — M159 Polyosteoarthritis, unspecified: Secondary | ICD-10-CM | POA: Diagnosis not present

## 2019-12-18 DIAGNOSIS — I10 Essential (primary) hypertension: Secondary | ICD-10-CM

## 2019-12-18 DIAGNOSIS — M1990 Unspecified osteoarthritis, unspecified site: Secondary | ICD-10-CM | POA: Diagnosis not present

## 2019-12-18 DIAGNOSIS — E7800 Pure hypercholesterolemia, unspecified: Secondary | ICD-10-CM

## 2019-12-18 MED ORDER — ATORVASTATIN CALCIUM 10 MG PO TABS: 10.0000 mg | ORAL_TABLET | Freq: Every day | ORAL | 3 refills | Status: DC

## 2019-12-18 MED ORDER — AMLODIPINE BESYLATE 10 MG PO TABS: 10.0000 mg | ORAL_TABLET | Freq: Every day | ORAL | 3 refills | Status: DC

## 2019-12-18 MED ORDER — GABAPENTIN 400 MG PO CAPS: 400.0000 mg | ORAL_CAPSULE | Freq: Every day | ORAL | 3 refills | Status: DC

## 2019-12-18 MED ORDER — HYDROCODONE-ACETAMINOPHEN 10-325 MG PO TABS: 1.0000 | ORAL_TABLET | Freq: Three times a day (TID) | ORAL | 0 refills | Status: DC | PRN

## 2019-12-21 LAB — TOXASSURE SELECT 13 (MW), URINE

## 2020-01-21 ENCOUNTER — Other Ambulatory Visit: Payer: Self-pay

## 2020-01-21 ENCOUNTER — Ambulatory Visit (INDEPENDENT_AMBULATORY_CARE_PROVIDER_SITE_OTHER): Payer: Medicare Other

## 2020-01-21 ENCOUNTER — Ambulatory Visit (INDEPENDENT_AMBULATORY_CARE_PROVIDER_SITE_OTHER): Payer: Medicare Other | Admitting: Physician Assistant

## 2020-01-21 ENCOUNTER — Ambulatory Visit: Payer: Medicare Other | Admitting: Family Medicine

## 2020-01-21 ENCOUNTER — Encounter: Payer: Self-pay | Admitting: Physician Assistant

## 2020-01-21 VITALS — BP 138/80 | HR 58 | Temp 99.1°F | Ht 73.0 in | Wt 220.8 lb

## 2020-01-21 DIAGNOSIS — M79645 Pain in left finger(s): Secondary | ICD-10-CM

## 2020-01-21 DIAGNOSIS — S63602A Unspecified sprain of left thumb, initial encounter: Secondary | ICD-10-CM

## 2020-01-21 NOTE — Patient Instructions (Signed)
Thumb Sprain  A thumb sprain is an injury to one of the bands of tissue (ligaments) that connect the bones in your thumb. The ligament may be stretched too much, or it may be torn. A tear can be either partial or complete. How bad, or severe, the sprain is depends on how much of the ligament was damaged or torn. What are the causes? A thumb sprain is often caused by a fall or an accident, such as when you hold your hands out to catch something or to protect yourself. What increases the risk? This injury is more likely to occur in people who play sports that involve:  A risk of falling, such as skiing.  Catching an object, such as basketball. What are the signs or symptoms? Symptoms of this condition include:  Not being able to move the thumb normally.  Swelling.  Tenderness.  Bruising. How is this diagnosed? This condition may be diagnosed based on:  Your symptoms and medical history. Your health care provider may ask about any recent injuries to your thumb.  A physical exam.  Imaging studies such as X-ray, ultrasound, or MRI. How is this treated? Treatment for this condition depends on how severe your sprain is.  If your ligament is overstretched or partially torn, treatment usually involves keeping your thumb in a fixed position (immobilization) for at least 4 to 6 weeks. Your health care provider will apply a bandage, cast, or splint to keep your thumb from moving until it heals.  If your ligament is fully torn, you may need surgery to reconnect the ligament to the bone. After surgery, you will need to wear a cast or splint on your thumb. Your health care provider may also recommend physical therapy to strengthen your thumb. Follow these instructions at home: If you have a splint or bandage:  Wear the splint or bandage as told by your health care provider. Remove it only as told by your health care provider.  Loosen the splint or bandage if your thumb or fingers tingle,  become numb, or turn cold and blue.  Keep the splint or bandage clean and dry. If you have a cast:  Do not stick anything inside the cast to scratch your skin. Doing that increases your risk of infection.  Check the skin around the cast every day. Tell your health care provider about any concerns.  You may put lotion on dry skin around the edges of the cast. Do not put lotion on the skin underneath the cast.  Keep the cast clean and dry. Bathing  Do not take baths, swim, or use a hot tub until your health care provider approves. Ask your health care provider if you may take showers. You may only be allowed to take sponge baths.  If your splint, bandage, or cast is not waterproof: ? Do not let it get wet. ? Cover it with a watertight covering to protect it from water when you take a bath or shower. Managing pain, stiffness, and swelling   If directed, put ice on your thumb: ? If you have a removable splint, remove it as told by your health care provider. ? Put ice in a plastic bag. ? Place a towel between your skin and the bag, or between your cast and the bag. ? Leave the ice on for 20 minutes, 2-3 times a day.  Move your fingers often to avoid stiffness and to lessen swelling.  Raise (elevate) your hand above the level of your heart  while you are sitting or lying down. Activity  Return to your normal activities as told by your health care provider. Ask your health care provider what activities are safe for you.  Do physical therapy exercises as directed. After your splint or cast is removed, your health care provider may recommend that you: ? Move your thumb in circles. ? Touch your thumb to your pinky finger. ? Do these exercises several times a day.  Ask your health care provider if you may use a hand exerciser to strengthen your muscles.  If your thumb feels stiff while you are exercising it, try doing the exercises while soaking your hand in warm water. Driving  Do  not drive until your health care provider approves.  Donot drive or use heavy machinery while taking prescription pain medicine. General instructions  Do not put pressure on any part of the cast or splint until it is fully hardened, if applicable. This may take several hours.  Take over-the-counter and prescription medicines only as told by your health care provider.  Do not use any products that contain nicotine or tobacco, such as cigarettes and e-cigarettes. These can delay healing. If you need help quitting, ask your health care provider.  Do not wear rings on your injured thumb.  Keep all follow-up visits as told by your health care provider. This is important. Contact a health care provider if you have:  Pain that gets worse or does not get better with medicine.  Bruising or swelling that gets worse.  Your cast or splint is damaged. Get help right away if:  Your thumb feels numb, tingles, turns cold, or turns blue, even after loosening your splint or bandage (if applicable). Summary  A thumb sprain is an injury to one of the bands of tissue (ligaments) that connect the bones in your thumb.  Thumb sprains are more likely to occur in people who play sports that involve a risk of falling or having to catch an object.  Treatment will depend on how severe the sprain is, but it will require keeping the thumb in a fixed position. It might require surgery.  Make sure you understand and follow all of your health care provider's instructions for home care. This information is not intended to replace advice given to you by your health care provider. Make sure you discuss any questions you have with your health care provider. Document Revised: 12/22/2017 Document Reviewed: 12/22/2017 [employer] Patient Education  2020 [employer]  

## 2020-01-24 NOTE — Progress Notes (Signed)
Acute Office Visit  Subjective:    Patient ID: Bradley Fisher, male    DOB: March 19, 1966, 54 y.o.   MRN: [id number]  Chief Complaint  Patient presents with  . thumb pain    left    Hand Pain  The incident occurred 2 days ago. The injury mechanism was a direct blow. The pain is present in the left fingers. The quality of the pain is described as aching. The pain is mild. Pertinent negatives include no chest pain or numbness. He has tried acetaminophen and ice for the symptoms. The treatment provided mild relief.   Xray negative, sprain only, recheck 2 weeks  Past Medical History:  Diagnosis Date  . Arthritis   . Asthma   . Depression   . GERD (gastroesophageal reflux disease)   . Hyperlipidemia   . Hypertension   . Seizures ([place])    unknow etiology; on meds for this; no seizures since on meds 10 years ago    Past Surgical History:  Procedure Laterality Date  . COLONOSCOPY WITH PROPOFOL N/A 05/29/2018   Procedure: COLONOSCOPY WITH PROPOFOL;  Surgeon: Daneil Dolin, MD;  Location[facility];  Service: Endoscopy;  Laterality: N/A;  9:45am  . KNEE ARTHROSCOPY WITH MEDIAL MENISECTOMY Left 11/11/2016   Procedure: LEFT KNEE ARTHROSCOPY WITH MEDIAL MENISECTOMY;  Surgeon: Carole Civil, MD;  Location: [facility];  Service: Orthopedics;  Laterality: Left;    Family History  Problem Relation Age of Onset  . Hypertension Mother   . Diabetes Mother   . Hypertension Father   . Diabetes Father   . Diabetes Brother   . Colon cancer Neg Hx     Social History   Socioeconomic History  . Marital status: Married    Spouse name: Not on file  . Number of children: 0  . Years of education: Not on file  . Highest education level: 7th grade  Occupational History  . Not on file  Tobacco Use  . Smoking status: Current Some Day Smoker    Types: Cigars  . Smokeless tobacco: Current User    Types: Snuff  . Tobacco comment: cigars uses 2 per month  Substance and Sexual Activity   . Alcohol use: Yes    Alcohol/week: 12.0 standard drinks    Types: 12 Cans of beer per week    Comment: drinks some weekends.   . Drug use: No  . Sexual activity: Yes    Birth control/protection: None  Other Topics Concern  . Not on file  Social History Narrative  . Not on file   Social Determinants of Health   Financial Resource Strain: Low Risk   . Difficulty of Paying Living Expenses: Not hard at all  Food Insecurity: No Food Insecurity  . Worried About Charity fundraiser in the Last Year: Never true  . Ran Out of Food in the Last Year: Never true  Transportation Needs: No Transportation Needs  . Lack of Transportation (Medical): No  . Lack of Transportation (Non-Medical): No  Physical Activity: Inactive  . Days of Exercise per Week: 0 days  . Minutes of Exercise per Session: 0 min  Stress: No Stress Concern Present  . Feeling of Stress : Not at all  Social Connections: Slightly Isolated  . Frequency of Communication with Friends and Family: Twice a week  . Frequency of Social Gatherings with Friends and Family: More than three times a week  . Attends Religious Services: More than 4 times per  year  . Active Member of Clubs or Organizations: No  . Attends Archivist Meetings: Never  . Marital Status: Married  Human resources officer Violence: Not At Risk  . Fear of Current or Ex-Partner: No  . Emotionally Abused: No  . Physically Abused: No  . Sexually Abused: No    Outpatient Medications Prior to Visit  Medication Sig Dispense Refill  . amLODipine (NORVASC) 10 MG tablet Take 1 tablet (10 mg total) by mouth daily. 90 tablet 3  . atorvastatin (LIPITOR) 10 MG tablet Take 1 tablet (10 mg total) by mouth daily. 90 tablet 3  . cetirizine (ZYRTEC) 10 MG tablet Take 1 tablet (10 mg total) by mouth daily. 90 tablet 3  . esomeprazole (NEXIUM) 40 MG capsule Take 1 capsule (40 mg total) by mouth daily. 90 capsule 3  . gabapentin (NEURONTIN) 400 MG capsule Take 1 capsule  (400 mg total) by mouth at bedtime. For PAIN 180 capsule 3  . HYDROcodone-acetaminophen (NORCO) 10-325 MG tablet Take 1 tablet by mouth every 8 (eight) hours as needed. 90 tablet 0  . HYDROcodone-acetaminophen (NORCO) 10-325 MG tablet Take 1 tablet by mouth every 8 (eight) hours as needed. 90 tablet 0  . HYDROcodone-acetaminophen (NORCO) 10-325 MG tablet Take 1 tablet by mouth every 8 (eight) hours as needed. 90 tablet 0  . hydrocortisone cream 0.5 % Apply 1 application topically 2 (two) times daily. 30 g 0  . lisinopril (ZESTRIL) 20 MG tablet TAKE 1 TABLET DAILY 90 tablet 1  . meloxicam (MOBIC) 15 MG tablet Take 1 tablet (15 mg total) by mouth daily. 90 tablet 3  . PROAIR HFA 108 (90 Base) MCG/ACT inhaler Inhale 2 puffs into the lungs every 6 (six) hours as needed for wheezing or shortness of breath. 18 g 1  . sertraline (ZOLOFT) 100 MG tablet Take 1 tablet (100 mg total) by mouth daily. 90 tablet 3   No facility-administered medications prior to visit.    No Known Allergies  Review of Systems  Constitutional: Negative.  Negative for appetite change and fatigue.  Eyes: Negative for pain and visual disturbance.  Respiratory: Negative.  Negative for cough, chest tightness, shortness of breath and wheezing.   Cardiovascular: Negative.  Negative for chest pain, palpitations and leg swelling.  Gastrointestinal: Negative.  Negative for abdominal pain, diarrhea, nausea and vomiting.  Genitourinary: Negative.   Musculoskeletal: Positive for arthralgias and joint swelling.  Skin: Negative.  Negative for color change and rash.  Neurological: Negative.  Negative for weakness, numbness and headaches.  Psychiatric/Behavioral: Negative.        Objective:    Physical Exam Vitals and nursing note reviewed.  Constitutional:      General: He is not in acute distress.    Appearance: He is well-developed.  HENT:     Head: Normocephalic and atraumatic.  Eyes:     Conjunctiva/sclera: Conjunctivae  normal.     Pupils: Pupils are equal, round, and reactive to light.  Cardiovascular:     Rate and Rhythm: Normal rate.  Pulmonary:     Effort: Pulmonary effort is normal. No respiratory distress.  Musculoskeletal:     Left hand: Swelling and bony tenderness present. No deformity. Decreased range of motion. Normal strength.  Skin:    General: Skin is warm and dry.  Psychiatric:        Behavior: Behavior normal.     BP 138/80   Pulse (!) 58   Temp 99.1 F (37.3 C) (Temporal)   Ht  6\' 1"  (1.854 m)   Wt 220 lb 12.8 oz (100.2 kg)   SpO2 98%   BMI 29.13 kg/m  Wt Readings from Last 3 Encounters:  01/21/20 220 lb 12.8 oz (100.2 kg)  12/18/19 216 lb 12.8 oz (98.3 kg)  09/17/19 214 lb (97.1 kg)    Health Maintenance Due  Topic Date Due  . HIV Screening  09/23/1980    There are no preventive care reminders to display for this patient.   Lab Results  Component Value Date   TSH 1.360 01/10/2019   Lab Results  Component Value Date   WBC 6.3 01/10/2019   HGB 13.8 01/10/2019   HCT 39.3 01/10/2019   MCV 86 01/10/2019   PLT 194 01/10/2019   Lab Results  Component Value Date   NA 137 09/17/2019   K 4.4 09/17/2019   CO2 23 09/17/2019   GLUCOSE 95 09/17/2019   BUN 15 09/17/2019   CREATININE 0.90 09/17/2019   BILITOT 0.6 09/17/2019   ALKPHOS 116 09/17/2019   AST 34 09/17/2019   ALT 19 09/17/2019   PROT 7.4 09/17/2019   ALBUMIN 4.9 09/17/2019   CALCIUM 9.9 09/17/2019   ANIONGAP 9 11/08/2016   Lab Results  Component Value Date   CHOL 141 09/17/2019   Lab Results  Component Value Date   HDL 37 (L) 09/17/2019   Lab Results  Component Value Date   LDLCALC 77 09/17/2019   Lab Results  Component Value Date   TRIG 156 (H) 09/17/2019   Lab Results  Component Value Date   CHOLHDL 3.8 09/17/2019   No results found for: HGBA1C     Assessment & Plan:   Problem List Items Addressed This Visit    None    Visit Diagnoses    Thumb pain, left    -  Primary    Relevant Orders   DG Finger Thumb Left (Completed)   Sprain of left thumb, unspecified site of digit, initial encounter       Relevant Orders   DG Finger Thumb Left (Completed)       No orders of the defined types were placed in this encounter.    Terald Sleeper, PA-C

## 2020-01-30 ENCOUNTER — Telehealth: Payer: Self-pay | Admitting: Physician Assistant

## 2020-01-30 ENCOUNTER — Other Ambulatory Visit: Payer: Self-pay | Admitting: Physician Assistant

## 2020-01-30 DIAGNOSIS — M79645 Pain in left finger(s): Secondary | ICD-10-CM

## 2020-01-30 NOTE — Telephone Encounter (Signed)
Sent Urgent ref to [facility] to see Dr. Amedeo Plenty :)

## 2020-01-30 NOTE — Telephone Encounter (Signed)
Use his last visit for notes about his injured thumb

## 2020-01-30 NOTE — Telephone Encounter (Signed)
Pt has been taking the thumb splint on and off. He does not take any pain relievers. Pt would like a cast. Instructed pt at this time [name] does not suggest a cast. A referral has been placed to the hand specialist today. Pt has been made aware. Suggested to pt to start taking either Tylenol or IBU for pain. Also to apply as prn.

## 2020-01-30 NOTE — Telephone Encounter (Signed)
I am going to refer him to a hand specialist/orthopedist.  The order has been placed.  Is there extra swelling?  He may need to loosen up the compression bandages that we used.

## 2020-01-30 NOTE — Telephone Encounter (Signed)
What symptoms do you have? Right thumb still hurting and it is getting numb. Seen Angel last week.  How long have you been sick? Since Feb 7th  Have you been seen for this problem? Feb 8th  If your provider decides to give you a prescription, which pharmacy would you like for it to be sent to? [facility]   Patient informed that this information will be sent to the clinical staff for review and that they should receive a follow up call.

## 2020-02-15 ENCOUNTER — Other Ambulatory Visit: Payer: Self-pay

## 2020-02-19 ENCOUNTER — Ambulatory Visit: Payer: Medicare Other | Admitting: Physician Assistant

## 2020-02-25 HISTORY — PX: OTHER SURGICAL HISTORY: SHX169

## 2020-02-28 ENCOUNTER — Other Ambulatory Visit: Payer: Self-pay | Admitting: Physician Assistant

## 2020-02-28 DIAGNOSIS — I10 Essential (primary) hypertension: Secondary | ICD-10-CM

## 2020-02-29 ENCOUNTER — Ambulatory Visit: Payer: Medicare Other | Admitting: Physician Assistant

## 2020-02-29 ENCOUNTER — Telehealth: Payer: Self-pay | Admitting: Physician Assistant

## 2020-02-29 ENCOUNTER — Ambulatory Visit: Payer: Medicare Other | Admitting: Nurse Practitioner

## 2020-02-29 NOTE — Telephone Encounter (Signed)
Aware, amlodipine was refilled.

## 2020-03-06 ENCOUNTER — Ambulatory Visit (INDEPENDENT_AMBULATORY_CARE_PROVIDER_SITE_OTHER): Payer: Medicare Other | Admitting: *Deleted

## 2020-03-06 DIAGNOSIS — Z0000 Encounter for general adult medical examination without abnormal findings: Secondary | ICD-10-CM | POA: Diagnosis not present

## 2020-03-06 NOTE — Patient Instructions (Signed)
  [facility] Maintenance Summary and Written Plan of Care  Mr. Bradley Fisher ,  Thank you for allowing me to perform your Medicare Annual Wellness Visit and for your ongoing commitment to your health.   Health Maintenance & Immunization History Health Maintenance  Topic Date Due  . HIV Screening  Never done  . TETANUS/TDAP  Never done  . COLONOSCOPY  05/29/2028  . INFLUENZA VACCINE  Completed   Immunization History  Administered Date(s) Administered  . Influenza,inj,Quad PF,6+ Mos 10/27/2016, 09/21/2017, 09/27/2018, 09/17/2019    These are the patient goals that we discussed: Goals Addressed            This Visit's Progress   . AWV       03/06/2020 AWV Goal: Exercise for General Health   Patient will verbalize understanding of the benefits of increased physical activity:  Exercising regularly is important. It will improve your overall fitness, flexibility, and endurance.  Regular exercise also will improve your overall health. It can help you control your weight, reduce stress, and improve your bone density.  Over the next year, patient will increase physical activity as tolerated with a goal of at least 150 minutes of moderate physical activity per week.   You can tell that you are exercising at a moderate intensity if your heart starts beating faster and you start breathing faster but can still hold a conversation.  Moderate-intensity exercise ideas include:  Walking 1 mile (1.6 km) in about 15 minutes  Biking  Hiking  Golfing  Dancing  Water aerobics  Patient will verbalize understanding of everyday activities that increase physical activity by providing examples like the following: ? Yard work, such as: ? Pushing a Conservation officer, nature ? Raking and bagging leaves ? Washing your car ? Pushing a stroller ? Shoveling snow ? Gardening ? Washing windows or floors  Patient will be able to explain general safety guidelines for exercising:    Before you start a new exercise program, talk with your health care provider.  Do not exercise so much that you hurt yourself, feel dizzy, or get very short of breath.  Wear comfortable clothes and wear shoes with good support.  Drink plenty of water while you exercise to prevent dehydration or heat stroke.  Work out until your breathing and your heartbeat get faster.         This is a list of Health Maintenance Items that are overdue or due now: Health Maintenance Due  Topic Date Due  . HIV Screening  Never done  . TETANUS/TDAP  Never done     Orders/Referrals Placed Today: No orders of the defined types were placed in this encounter.  (Contact our referral department at [phone] if you have not spoken with someone about your referral appointment within the next 5 days)    Follow-up Plan . Follow-up with Loman Brooklyn, FNP as planned . Schedule your yearly eye exam  . Please consider tdap at your next visit

## 2020-03-06 NOTE — Progress Notes (Addendum)
MEDICARE ANNUAL WELLNESS VISIT  03/06/2020  Telephone Visit Disclaimer This Medicare AWV was conducted by telephone due to national recommendations for restrictions regarding the COVID-19 Pandemic (e.g. social distancing).  I verified, using two identifiers, that I am speaking with Bradley Fisher or their authorized healthcare agent. I discussed the limitations, risks, security, and privacy concerns of performing an evaluation and management service by telephone and the potential availability of an in-person appointment in the future. The patient expressed understanding and agreed to proceed.   Subjective:  Bradley Fisher is a 54 y.o. male patient of Loman Brooklyn, FNP who had a Medicare Annual Wellness Visit today via telephone. Bradley Fisher is Disabled and lives with his wife. He was 2 step sons. He reports that he is socially active and does interact with friends/family regularly. He is minimally physically active and enjoys working in the yard and helping friends with home improvements.   Patient Care Team: Loman Brooklyn, FNP as PCP - General (Family Medicine) Gala Romney, Cristopher Estimable, MD as Consulting Physician (Gastroenterology) Carole Civil, MD as Consulting Physician (Orthopedic Surgery)  Advanced Directives 03/06/2020 02/12/2019 05/29/2018 05/23/2018 11/11/2016 11/08/2016  Does Patient Have a Medical Advance Directive? No No No No No No  Does patient want to make changes to medical advance directive? - Yes (MAU/Ambulatory/Procedural Areas - Information given) - - - -  Would patient like information on creating a medical advance directive? No - Patient declined - No - Patient declined No - Patient declined No - Patient declined No - Patient declined    Hospital Utilization Over the Past 12 Months: # of hospitalizations or ER visits: 0 # of surgeries: 1  Review of Systems    Patient reports that his overall health is better compared to last year.  History obtained from chart  review and the patient  Patient Reported Readings (BP, Pulse, CBG, Weight, etc) none  Pain Assessment Pain : No/denies pain     Current Medications & Allergies (verified) Allergies as of 03/06/2020   No Known Allergies      Medication List        Accurate as of March 06, 2020 11:12 AM. If you have any questions, ask your nurse or doctor.          amLODipine 10 MG tablet Commonly known as: NORVASC Take 1 tablet (10 mg total) by mouth daily. What changed: Another medication with the same name was removed. Continue taking this medication, and follow the directions you see here.   atorvastatin 10 MG tablet Commonly known as: LIPITOR Take 1 tablet (10 mg total) by mouth daily.   cetirizine 10 MG tablet Commonly known as: ZYRTEC Take 1 tablet (10 mg total) by mouth daily.   esomeprazole 40 MG capsule Commonly known as: NEXIUM Take 1 capsule (40 mg total) by mouth daily.   gabapentin 400 MG capsule Commonly known as: NEURONTIN Take 1 capsule (400 mg total) by mouth at bedtime. For PAIN   HYDROcodone-acetaminophen 10-325 MG tablet Commonly known as: NORCO Take 1 tablet by mouth every 8 (eight) hours as needed.   HYDROcodone-acetaminophen 10-325 MG tablet Commonly known as: NORCO Take 1 tablet by mouth every 8 (eight) hours as needed.   HYDROcodone-acetaminophen 10-325 MG tablet Commonly known as: NORCO Take 1 tablet by mouth every 8 (eight) hours as needed.   hydrocortisone cream 0.5 % Apply 1 application topically 2 (two) times daily.   lisinopril 20 MG tablet Commonly known as: ZESTRIL TAKE  1 TABLET DAILY   meloxicam 15 MG tablet Commonly known as: MOBIC Take 1 tablet (15 mg total) by mouth daily.   ProAir HFA 108 (90 Base) MCG/ACT inhaler Generic drug: albuterol Inhale 2 puffs into the lungs every 6 (six) hours as needed for wheezing or shortness of breath.   sertraline 100 MG tablet Commonly known as: ZOLOFT Take 1 tablet (100 mg total) by mouth  daily.        History (reviewed): Past Medical History:  Diagnosis Date   Arthritis    Asthma    Depression    GERD (gastroesophageal reflux disease)    Hyperlipidemia    Hypertension    Seizures ([place])    unknow etiology; on meds for this; no seizures since on meds 10 years ago   Past Surgical History:  Procedure Laterality Date   COLONOSCOPY WITH PROPOFOL N/A 05/29/2018   Procedure: COLONOSCOPY WITH PROPOFOL;  Surgeon: Daneil Dolin, MD;  [facility];  Service: Endoscopy;  Laterality: N/A;  9:45am   KNEE ARTHROSCOPY WITH MEDIAL MENISECTOMY Left 11/11/2016   Procedure: LEFT KNEE ARTHROSCOPY WITH MEDIAL MENISECTOMY;  Surgeon: Carole Civil, MD;  Location: [facility];  Service: Orthopedics;  Laterality: Left;   Thumb surgery-left     Family History  Problem Relation Age of Onset   Hypertension Mother    Diabetes Mother    Hypertension Father    Diabetes Father    Diabetes Brother    Colon cancer Neg Hx    Social History   Socioeconomic History   Marital status: Married    Spouse name: Not on file   Number of children: 0   Years of education: Not on file   Highest education level: 7th grade  Occupational History   Occupation: Disabled  Tobacco Use   Smoking status: Current Some Day Smoker    Types: Cigars   Smokeless tobacco: Current User    Types: Snuff   Tobacco comment: cigars uses 2 per month  Substance and Sexual Activity   Alcohol use: Yes    Alcohol/week: 12.0 standard drinks    Types: 12 Cans of beer per week    Comment: drinks some weekends.    Drug use: No   Sexual activity: Yes    Birth control/protection: None  Other Topics Concern   Not on file  Social History Narrative   Not on file   Social Determinants of Health   Financial Resource Strain:    Difficulty of Paying Living Expenses:   Food Insecurity:    Worried About Charity fundraiser in the Last Year:    Arboriculturist in the Last Year:   Transportation Needs:     Film/video editor (Medical):    Lack of Transportation (Non-Medical):   Physical Activity:    Days of Exercise per Week:    Minutes of Exercise per Session:   Stress:    Feeling of Stress :   Social Connections:    Frequency of Communication with Friends and Family:    Frequency of Social Gatherings with Friends and Family:    Attends Religious Services:    Active Member of Clubs or Organizations:    Attends Archivist Meetings:    Marital Status:     Activities of Daily Living In your present state of health, do you have any difficulty performing the following activities: 03/06/2020  Hearing? N  Vision? N  Comment Wears glasses  Difficulty concentrating or making  decisions? N  Walking or climbing stairs? N  Dressing or bathing? N  Doing errands, shopping? N  Preparing Food and eating ? N  Using the Toilet? N  In the past six months, have you accidently leaked urine? N  Do you have problems with loss of bowel control? N  Managing your Medications? N  Managing your Finances? N  Housekeeping or managing your Housekeeping? N  Some recent data might be hidden    Patient Education/ Literacy How often do you need to have someone help you when you read instructions, pamphlets, or other written materials from your doctor or pharmacy?: 5 - Always What is the last grade level you completed in school?: 7th  Exercise Current Exercise Habits: Home exercise routine, Type of exercise: walking, Time (Minutes): 15, Frequency (Times/Week): 7, Weekly Exercise (Minutes/Week): 105, Intensity: Mild  Diet Patient reports consuming 3 meals a day and 3 snack(s) a day Patient reports that his primary diet is: Regular Patient reports that she does have regular access to food.   Depression Screen PHQ 2/9 Scores 01/21/2020 12/18/2019 09/17/2019 06/13/2019 03/12/2019 02/12/2019 02/12/2019  PHQ - 2 Score 0 0 0 0 0 0 0  PHQ- 9 Score - 0 - - - - -     Fall Risk Fall Risk  02/12/2019 02/27/2018  02/10/2017  Falls in the past year? 0 No No     Objective:  Bradley Fisher seemed alert and oriented and he participated appropriately during our telephone visit.  Blood Pressure Weight BMI  BP Readings from Last 3 Encounters:  01/21/20 138/80  12/18/19 114/75  09/17/19 127/79   Wt Readings from Last 3 Encounters:  01/21/20 220 lb 12.8 oz (100.2 kg)  12/18/19 216 lb 12.8 oz (98.3 kg)  09/17/19 214 lb (97.1 kg)   BMI Readings from Last 1 Encounters:  01/21/20 29.13 kg/m    *Unable to obtain current vital signs, weight, and BMI due to telephone visit type  Hearing/Vision  Bradley Fisher did not seem to have difficulty with hearing/understanding during the telephone conversation Reports that he has not had a formal eye exam by an eye care professional within the past year Reports that he has not had a formal hearing evaluation within the past year *Unable to fully assess hearing and vision during telephone visit type  Cognitive Function: 6CIT Screen 03/06/2020 02/12/2019  What Year? 0 points 0 points  What month? 3 points 3 points  What time? 0 points 0 points  Count back from 20 0 points 0 points  Months in reverse (No Data) -  Repeat phrase 8 points 8 points   (Normal:0-7, Significant for Dysfunction: >8)  Normal Cognitive Function Screening: No: patient states he is unable to complete   Immunization & Health Maintenance Record Immunization History  Administered Date(s) Administered   Influenza,inj,Quad PF,6+ Mos 10/27/2016, 09/21/2017, 09/27/2018, 09/17/2019    Health Maintenance  Topic Date Due   HIV Screening  Never done   TETANUS/TDAP  Never done   COLONOSCOPY  05/29/2028   INFLUENZA VACCINE  Completed       Assessment  This is a routine wellness examination for Bradley Fisher.  Health Maintenance: Due or Overdue Health Maintenance Due  Topic Date Due   HIV Screening  Never done   TETANUS/TDAP  Never done    Bradley Fisher does not need a referral for  [employer] Assistance: Care Management:   no Social Work:    no Prescription Assistance:  no Nutrition/Diabetes Education:  no   Plan:  Personalized Goals Goals Addressed   None    Personalized Health Maintenance & Screening Recommendations  Td vaccine  Lung Cancer Screening Recommended: not applicable (Low Dose CT Chest recommended if Age 88-80 years, 30 pack-year currently smoking OR have quit w/in past 15 years) Hepatitis C Screening recommended: not applicable HIV Screening recommended: yes  Advanced Directives: Written information was not prepared per patient's request.  Referrals & Orders No orders of the defined types were placed in this encounter.   Follow-up Plan Follow-up with Loman Brooklyn, FNP as planned Schedule your yearly eye exam  Please consider tdap at your next visit.    I have personally reviewed and noted the following in the patient's chart:   Medical and social history Use of alcohol, tobacco or illicit drugs  Current medications and supplements Functional ability and status Nutritional status Physical activity Advanced directives List of other physicians Hospitalizations, surgeries, and ER visits in previous 12 months Vitals Screenings to include cognitive, depression, and falls Referrals and appointments  In addition, I have reviewed and discussed with Bradley Fisher certain preventive protocols, quality metrics, and best practice recommendations. A written personalized care plan for preventive services as well as general preventive health recommendations is available and can be mailed to the patient at his request.      Lynnea Ferrier, LPN [id number]    I have reviewed and agree with the above AWV documentation.   Evelina Dun, FNP

## 2020-03-17 ENCOUNTER — Encounter: Payer: Self-pay | Admitting: Family Medicine

## 2020-03-18 ENCOUNTER — Ambulatory Visit: Payer: Medicare Other | Admitting: Physician Assistant

## 2020-03-19 ENCOUNTER — Encounter: Payer: Self-pay | Admitting: *Deleted

## 2020-03-19 ENCOUNTER — Encounter: Payer: Self-pay | Admitting: Family Medicine

## 2020-03-19 ENCOUNTER — Ambulatory Visit (INDEPENDENT_AMBULATORY_CARE_PROVIDER_SITE_OTHER): Payer: Medicare Other | Admitting: Family Medicine

## 2020-03-19 ENCOUNTER — Other Ambulatory Visit: Payer: Self-pay

## 2020-03-19 VITALS — BP 119/74 | HR 58 | Temp 98.4°F | Ht 73.0 in | Wt 219.4 lb

## 2020-03-19 DIAGNOSIS — M1990 Unspecified osteoarthritis, unspecified site: Secondary | ICD-10-CM | POA: Diagnosis not present

## 2020-03-19 DIAGNOSIS — K219 Gastro-esophageal reflux disease without esophagitis: Secondary | ICD-10-CM

## 2020-03-19 DIAGNOSIS — M545 Low back pain: Secondary | ICD-10-CM | POA: Diagnosis not present

## 2020-03-19 DIAGNOSIS — F339 Major depressive disorder, recurrent, unspecified: Secondary | ICD-10-CM

## 2020-03-19 DIAGNOSIS — Z79899 Other long term (current) drug therapy: Secondary | ICD-10-CM

## 2020-03-19 DIAGNOSIS — G8929 Other chronic pain: Secondary | ICD-10-CM

## 2020-03-19 DIAGNOSIS — I10 Essential (primary) hypertension: Secondary | ICD-10-CM

## 2020-03-19 DIAGNOSIS — E7800 Pure hypercholesterolemia, unspecified: Secondary | ICD-10-CM

## 2020-03-19 MED ORDER — SERTRALINE HCL 100 MG PO TABS: 100.0000 mg | ORAL_TABLET | Freq: Every day | ORAL | 1 refills | Status: DC

## 2020-03-19 MED ORDER — MELOXICAM 15 MG PO TABS: 15.0000 mg | ORAL_TABLET | Freq: Every day | ORAL | 1 refills | Status: DC

## 2020-03-19 MED ORDER — HYDROCODONE-ACETAMINOPHEN 10-325 MG PO TABS: 1.0000 | ORAL_TABLET | Freq: Two times a day (BID) | ORAL | 0 refills | Status: DC | PRN

## 2020-03-19 MED ORDER — LISINOPRIL 20 MG PO TABS: 20.0000 mg | ORAL_TABLET | Freq: Every day | ORAL | 1 refills | Status: DC

## 2020-03-19 MED ORDER — AMLODIPINE BESYLATE 10 MG PO TABS: 10.0000 mg | ORAL_TABLET | Freq: Every day | ORAL | 1 refills | Status: DC

## 2020-03-19 MED ORDER — ATORVASTATIN CALCIUM 10 MG PO TABS: 10.0000 mg | ORAL_TABLET | Freq: Every day | ORAL | 1 refills | Status: DC

## 2020-03-19 MED ORDER — GABAPENTIN 400 MG PO CAPS: 400.0000 mg | ORAL_CAPSULE | Freq: Every day | ORAL | 1 refills | Status: DC

## 2020-03-19 MED ORDER — FLUTICASONE PROPIONATE 50 MCG/ACT NA SUSP: 2.0000 | Freq: Every day | NASAL | 6 refills | Status: AC

## 2020-03-19 MED ORDER — ESOMEPRAZOLE MAGNESIUM 40 MG PO CPDR: 40.0000 mg | DELAYED_RELEASE_CAPSULE | Freq: Every day | ORAL | 1 refills | Status: DC

## 2020-03-19 NOTE — Progress Notes (Signed)
Assessment & Plan:  1. Arthritis/Chronic midline low back pain without sciatica - Due to patient changing his answers to multiple questions and inconsistency while discussing pain medication I have advised that he have his orthopedic take over management of his pain medications.  At this is not an option for him we can refer him to pain management.  Initially when patient told me he only takes one of his pain pills a day I asked him what he does with the other 60 tablets since he consistently gets it filled monthly, he tells me he puts extra tablets in his drawer.  When I asked him about having 60 extra tablets a month was when he changed his answer to how many he takes a day from 1 up to 2.  I then asked him if he has 30 extra tablets a day.  He just stares at me without answering.  I asked him since he gets a refill monthly and he has at least 30 extra a month if he has 360 pain pills in a drawer at his house and he answered no that he does not have any. - I did go ahead and give him a 1 month supply as if he was taking it twice a day since that was his final answer to me, to allow him time to get set up elsewhere.. - gabapentin (NEURONTIN) 400 MG capsule; Take 1 capsule (400 mg total) by mouth at bedtime. For PAIN  Dispense: 90 capsule; Refill: 1 - HYDROcodone-acetaminophen (NORCO) 10-325 MG tablet; Take 1 tablet by mouth 2 (two) times daily as needed for severe pain.  Dispense: 60 tablet; Refill: 0 - Compliance Drug Analysis, Ur - meloxicam (MOBIC) 15 MG tablet; Take 1 tablet (15 mg total) by mouth daily.  Dispense: 90 tablet; Refill: 1  3. Controlled substance agreement signed - Agreement signed today since I did give him a 1 month supply.  4. Depression, recurrent (HCC) - sertraline (ZOLOFT) 100 MG tablet; Take 1 tablet (100 mg total) by mouth daily.  Dispense: 90 tablet; Refill: 1  5. Elevated cholesterol - atorvastatin (LIPITOR) 10 MG tablet; Take 1 tablet (10 mg total) by mouth daily.   Dispense: 90 tablet; Refill: 1  6. Gastroesophageal reflux disease without esophagitis - esomeprazole (NEXIUM) 40 MG capsule; Take 1 capsule (40 mg total) by mouth daily.  Dispense: 90 capsule; Refill: 1  7. Hypertension, essential - Well controlled on current regimen.  - amLODipine (NORVASC) 10 MG tablet; Take 1 tablet (10 mg total) by mouth daily.  Dispense: 90 tablet; Refill: 1 - lisinopril (ZESTRIL) 20 MG tablet; Take 1 tablet (20 mg total) by mouth daily.  Dispense: 90 tablet; Refill: 1   Return in about 3 months (around 06/18/2020) for follow-up of chronic medication conditions.  Hendricks Limes, MSN, APRN, FNP-C [facility]  Subjective:    Patient ID: Bradley Fisher, male    DOB: [birth year], 54 y.o.   MRN: [id number]  Patient Care Team: Loman Brooklyn, FNP as PCP - General (Family Medicine) Gala Romney Cristopher Estimable, MD as Consulting Physician (Gastroenterology) Carole Civil, MD as Consulting Physician (Orthopedic Surgery)   Chief Complaint:  Chief Complaint  Patient presents with  . Establish Care  . Hypertension    3 month follow up    HPI: Bradley Fisher is a 54 y.o. male presenting on 03/19/2020 for Establish Care and Hypertension (3 month follow up)  PAIN ASSESSMENT: Cause of pain-arthritis, DDD and DJD  Patient  currently has a prescription for Norco 10/325 1 TID for pain. He initially reports he only takes 1 tablet per day. Later he changed his answer to 2 tablets per day.  Gabapentin 400 mg at bedtime Mobic 15 mg once daily Behavior-normal Medication side effects-no Any concerns-no  Pain on scale of 1-10: 10/10 without medication; initially 8/10 with medication but then changed his answer to 5/10 with medication  Frequency-daily What increases pain-walking What makes pain Better-sitting down, medications Effects on ADL -none Any change in general medical condition-no  Effectiveness of current meds-good Adverse  reactions form pain meds- no PMP AWARE website reviewed:Yes Any suspicious activity on PMP Aware:No MME daily dose: 30 MME/day Contract on file- completed today Last UDS1/04/2020  History of overdose or risk of abusenone  New complaints: None  Social history:  Relevant past medical, surgical, family and social history reviewed and updated as indicated. Interim medical history since our last visit reviewed.  Allergies and medications reviewed and updated.  DATA REVIEWED: CHART IN EPIC  ROS: Negative unless specifically indicated above in HPI.    Current Outpatient Medications:  .  amLODipine (NORVASC) 10 MG tablet, Take 1 tablet (10 mg total) by mouth daily., Disp: 90 tablet, Rfl: 1 .  atorvastatin (LIPITOR) 10 MG tablet, Take 1 tablet (10 mg total) by mouth daily., Disp: 90 tablet, Rfl: 1 .  cetirizine (ZYRTEC) 10 MG tablet, Take 1 tablet (10 mg total) by mouth daily., Disp: 90 tablet, Rfl: 3 .  esomeprazole (NEXIUM) 40 MG capsule, Take 1 capsule (40 mg total) by mouth daily., Disp: 90 capsule, Rfl: 1 .  gabapentin (NEURONTIN) 400 MG capsule, Take 1 capsule (400 mg total) by mouth at bedtime. For PAIN, Disp: 90 capsule, Rfl: 1 .  [START ON 03/31/2020] HYDROcodone-acetaminophen (NORCO) 10-325 MG tablet, Take 1 tablet by mouth 2 (two) times daily as needed for severe pain., Disp: 60 tablet, Rfl: 0 .  lisinopril (ZESTRIL) 20 MG tablet, Take 1 tablet (20 mg total) by mouth daily., Disp: 90 tablet, Rfl: 1 .  meloxicam (MOBIC) 15 MG tablet, Take 1 tablet (15 mg total) by mouth daily., Disp: 90 tablet, Rfl: 1 .  PROAIR HFA 108 (90 Base) MCG/ACT inhaler, Inhale 2 puffs into the lungs every 6 (six) hours as needed for wheezing or shortness of breath., Disp: 18 g, Rfl: 1 .  sertraline (ZOLOFT) 100 MG tablet, Take 1 tablet (100 mg total) by mouth daily., Disp: 90 tablet, Rfl: 1 .  fluticasone (FLONASE) 50 MCG/ACT nasal spray, Place 2 sprays into both nostrils daily., Disp: 16 g, Rfl: 6    No Known Allergies Past Medical History:  Diagnosis Date  . Arthritis   . Asthma   . Depression   . GERD (gastroesophageal reflux disease)   . Hyperlipidemia   . Hypertension   . Seizures ([place])    unknow etiology; on meds for this; no seizures since on meds 10 years ago    Past Surgical History:  Procedure Laterality Date  . COLONOSCOPY WITH PROPOFOL N/A 05/29/2018   Procedure: COLONOSCOPY WITH PROPOFOL;  Surgeon: Daneil Dolin, MD;  Location: [facility];  Service: Endoscopy;  Laterality: N/A;  9:45am  . KNEE ARTHROSCOPY WITH MEDIAL MENISECTOMY Left 11/11/2016   Procedure: LEFT KNEE ARTHROSCOPY WITH MEDIAL MENISECTOMY;  Surgeon: Carole Civil, MD;  Location: [facility];  Service: Orthopedics;  Laterality: Left;  . Thumb surgery-left  02/25/2020    Social History   Socioeconomic History  . Marital status: Married  Spouse name: Not on file  . Number of children: 0  . Years of education: Not on file  . Highest education level: 7th grade  Occupational History  . Occupation: Disabled  Tobacco Use  . Smoking status: Current Some Day Smoker    Types: Cigars  . Smokeless tobacco: Current User    Types: Snuff  . Tobacco comment: cigars uses 2 per month  Substance and Sexual Activity  . Alcohol use: Yes    Alcohol/week: 12.0 standard drinks    Types: 12 Cans of beer per week    Comment: drinks some weekends.   . Drug use: No  . Sexual activity: Yes    Birth control/protection: None  Other Topics Concern  . Not on file  Social History Narrative  . Not on file   Social Determinants of Health   Financial Resource Strain:   . Difficulty of Paying Living Expenses:   Food Insecurity:   . Worried About Charity fundraiser in the Last Year:   . Arboriculturist in the Last Year:   Transportation Needs:   . Film/video editor (Medical):   Marland Kitchen Lack of Transportation (Non-Medical):   Physical Activity:   . Days of Exercise per Week:   . Minutes of Exercise per  Session:   Stress:   . Feeling of Stress :   Social Connections:   . Frequency of Communication with Friends and Family:   . Frequency of Social Gatherings with Friends and Family:   . Attends Religious Services:   . Active Member of Clubs or Organizations:   . Attends Archivist Meetings:   Marland Kitchen Marital Status:   Intimate Partner Violence:   . Fear of Current or Ex-Partner:   . Emotionally Abused:   Marland Kitchen Physically Abused:   . Sexually Abused:         Objective:    BP 119/74   Pulse (!) 58   Temp 98.4 F (36.9 C) (Temporal)   Ht 6\' 1"  (1.854 m)   Wt 219 lb 6.4 oz (99.5 kg)   SpO2 99%   BMI 28.95 kg/m   Wt Readings from Last 3 Encounters:  03/19/20 219 lb 6.4 oz (99.5 kg)  01/21/20 220 lb 12.8 oz (100.2 kg)  12/18/19 216 lb 12.8 oz (98.3 kg)    Physical Exam Vitals reviewed.  Constitutional:      General: He is not in acute distress.    Appearance: Normal appearance. He is overweight. He is not ill-appearing, toxic-appearing or diaphoretic.  HENT:     Head: Normocephalic and atraumatic.  Eyes:     General: No scleral icterus.       Right eye: No discharge.        Left eye: No discharge.     Conjunctiva/sclera: Conjunctivae normal.  Cardiovascular:     Rate and Rhythm: Normal rate and regular rhythm.     Heart sounds: Normal heart sounds. No murmur. No friction rub. No gallop.   Pulmonary:     Effort: Pulmonary effort is normal. No respiratory distress.     Breath sounds: Normal breath sounds. No stridor. No wheezing, rhonchi or rales.  Musculoskeletal:        General: Normal range of motion.     Cervical back: Normal range of motion.  Skin:    General: Skin is warm and dry.  Neurological:     Mental Status: He is alert and oriented to person, place, and time. Mental status  is at baseline.  Psychiatric:        Mood and Affect: Mood normal.        Behavior: Behavior normal.        Thought Content: Thought content normal.        Judgment: Judgment  normal.     Lab Results  Component Value Date   TSH 1.360 01/10/2019   Lab Results  Component Value Date   WBC 6.3 01/10/2019   HGB 13.8 01/10/2019   HCT 39.3 01/10/2019   MCV 86 01/10/2019   PLT 194 01/10/2019   Lab Results  Component Value Date   NA 137 09/17/2019   K 4.4 09/17/2019   CO2 23 09/17/2019   GLUCOSE 95 09/17/2019   BUN 15 09/17/2019   CREATININE 0.90 09/17/2019   BILITOT 0.6 09/17/2019   ALKPHOS 116 09/17/2019   AST 34 09/17/2019   ALT 19 09/17/2019   PROT 7.4 09/17/2019   ALBUMIN 4.9 09/17/2019   CALCIUM 9.9 09/17/2019   ANIONGAP 9 11/08/2016   Lab Results  Component Value Date   CHOL 141 09/17/2019   Lab Results  Component Value Date   HDL 37 (L) 09/17/2019   Lab Results  Component Value Date   LDLCALC 77 09/17/2019   Lab Results  Component Value Date   TRIG 156 (H) 09/17/2019   Lab Results  Component Value Date   CHOLHDL 3.8 09/17/2019   No results found for: HGBA1C

## 2020-03-22 LAB — COMPLIANCE DRUG ANALYSIS, UR

## 2020-04-01 ENCOUNTER — Ambulatory Visit: Payer: Medicare Other | Admitting: Nurse Practitioner

## 2020-04-30 ENCOUNTER — Other Ambulatory Visit: Payer: Self-pay | Admitting: Family Medicine

## 2020-04-30 DIAGNOSIS — M1990 Unspecified osteoarthritis, unspecified site: Secondary | ICD-10-CM

## 2020-05-06 ENCOUNTER — Other Ambulatory Visit: Payer: Self-pay

## 2020-05-06 ENCOUNTER — Encounter: Payer: Self-pay | Admitting: Family Medicine

## 2020-05-06 ENCOUNTER — Ambulatory Visit (INDEPENDENT_AMBULATORY_CARE_PROVIDER_SITE_OTHER): Payer: Medicare Other | Admitting: Family Medicine

## 2020-05-06 VITALS — BP 136/75 | HR 55 | Temp 98.1°F | Resp 20 | Ht 73.0 in | Wt 219.0 lb

## 2020-05-06 DIAGNOSIS — G8929 Other chronic pain: Secondary | ICD-10-CM

## 2020-05-06 DIAGNOSIS — M1990 Unspecified osteoarthritis, unspecified site: Secondary | ICD-10-CM

## 2020-05-06 DIAGNOSIS — M545 Low back pain, unspecified: Secondary | ICD-10-CM

## 2020-05-06 MED ORDER — HYDROCODONE-ACETAMINOPHEN 10-325 MG PO TABS: 1.0000 | ORAL_TABLET | Freq: Two times a day (BID) | ORAL | 0 refills | Status: DC | PRN

## 2020-05-06 NOTE — Addendum Note (Signed)
Addended by: Loman Brooklyn on: 05/06/2020 05:07 PM   Modules accepted: Orders

## 2020-05-06 NOTE — Progress Notes (Signed)
Assessment & Plan:  1-2. Arthritis/Chronic midline low back pain without sciatica - I did give him 30 tablets to last until he gets in to pain management but advised I would not be able to give any further prescriptions for this controlled substance.  - Ambulatory referral to Pain Clinic - HYDROcodone-acetaminophen (NORCO) 10-325 MG tablet; Take 1 tablet by mouth 2 (two) times daily as needed for severe pain.  Dispense: 30 tablet; Refill: 0   Return as scheduled.  Hendricks Limes, MSN, APRN, FNP-C [facility]  Subjective:    Patient ID: Bradley Fisher, male    DOB: 1966/08/31, 54 y.o.   MRN: [id number]  Patient Care Team: Loman Brooklyn, FNP as PCP - General (Family Medicine) Gala Romney Cristopher Estimable, MD as Consulting Physician (Gastroenterology) Carole Civil, MD as Consulting Physician (Orthopedic Surgery)   Chief Complaint:  Chief Complaint  Patient presents with  . Medical Management of Chronic Issues  . Hypertension  . Depression    HPI: Bradley Fisher is a 54 y.o. male presenting on 05/06/2020 for Medical Management of Chronic Issues, Hypertension, and Depression  Patient is here for a referral to pain management. He reports he spoke to his orthopedic about managing his pain and they would not do it. I had advised patient on 03/19/20 that I would not be prescribing his controlled substance due to his changing of answers to multiple questions and inconsistencies while discussing pain medication. He would like to go to [facility] on [address] as he has a friend that goes there and has heard good things about them.   New complaints: None  Social history:  Relevant past medical, surgical, family and social history reviewed and updated as indicated. Interim medical history since our last visit reviewed.  Allergies and medications reviewed and updated.  DATA REVIEWED: CHART IN EPIC  ROS: Negative unless specifically indicated above in HPI.     Current Outpatient Medications:  .  amLODipine (NORVASC) 10 MG tablet, Take 1 tablet (10 mg total) by mouth daily., Disp: 90 tablet, Rfl: 1 .  atorvastatin (LIPITOR) 10 MG tablet, Take 1 tablet (10 mg total) by mouth daily., Disp: 90 tablet, Rfl: 1 .  cetirizine (ZYRTEC) 10 MG tablet, Take 1 tablet (10 mg total) by mouth daily., Disp: 90 tablet, Rfl: 3 .  esomeprazole (NEXIUM) 40 MG capsule, Take 1 capsule (40 mg total) by mouth daily., Disp: 90 capsule, Rfl: 1 .  fluticasone (FLONASE) 50 MCG/ACT nasal spray, Place 2 sprays into both nostrils daily., Disp: 16 g, Rfl: 6 .  gabapentin (NEURONTIN) 400 MG capsule, Take 1 capsule (400 mg total) by mouth at bedtime. For PAIN, Disp: 90 capsule, Rfl: 1 .  HYDROcodone-acetaminophen (NORCO) 10-325 MG tablet, Take 1 tablet by mouth 2 (two) times daily as needed for severe pain., Disp: 30 tablet, Rfl: 0 .  lisinopril (ZESTRIL) 20 MG tablet, Take 1 tablet (20 mg total) by mouth daily., Disp: 90 tablet, Rfl: 1 .  meloxicam (MOBIC) 15 MG tablet, Take 1 tablet (15 mg total) by mouth daily., Disp: 90 tablet, Rfl: 1 .  PROAIR HFA 108 (90 Base) MCG/ACT inhaler, Inhale 2 puffs into the lungs every 6 (six) hours as needed for wheezing or shortness of breath., Disp: 18 g, Rfl: 1 .  sertraline (ZOLOFT) 100 MG tablet, Take 1 tablet (100 mg total) by mouth daily., Disp: 90 tablet, Rfl: 1   No Known Allergies Past Medical History:  Diagnosis Date  . Arthritis   .  Asthma   . Depression   . GERD (gastroesophageal reflux disease)   . Hyperlipidemia   . Hypertension   . Seizures ([place])    unknow etiology; on meds for this; no seizures since on meds 10 years ago    Past Surgical History:  Procedure Laterality Date  . COLONOSCOPY WITH PROPOFOL N/A 05/29/2018   Procedure: COLONOSCOPY WITH PROPOFOL;  Surgeon: Daneil Dolin, MD;  [facility];  Service: Endoscopy;  Laterality: N/A;  9:45am  . KNEE ARTHROSCOPY WITH MEDIAL MENISECTOMY Left 11/11/2016    Procedure: LEFT KNEE ARTHROSCOPY WITH MEDIAL MENISECTOMY;  Surgeon: Carole Civil, MD;  Location: [facility];  Service: Orthopedics;  Laterality: Left;  . Thumb surgery-left  02/25/2020    Social History   Socioeconomic History  . Marital status: Married    Spouse name: Not on file  . Number of children: 0  . Years of education: Not on file  . Highest education level: 7th grade  Occupational History  . Occupation: Disabled  Tobacco Use  . Smoking status: Current Some Day Smoker    Types: Cigars  . Smokeless tobacco: Current User    Types: Snuff  . Tobacco comment: cigars uses 2 per month  Substance and Sexual Activity  . Alcohol use: Yes    Alcohol/week: 12.0 standard drinks    Types: 12 Cans of beer per week    Comment: drinks some weekends.   . Drug use: No  . Sexual activity: Yes    Birth control/protection: None  Other Topics Concern  . Not on file  Social History Narrative  . Not on file   Social Determinants of Health   Financial Resource Strain:   . Difficulty of Paying Living Expenses:   Food Insecurity:   . Worried About Charity fundraiser in the Last Year:   . Arboriculturist in the Last Year:   Transportation Needs:   . Film/video editor (Medical):   Marland Kitchen Lack of Transportation (Non-Medical):   Physical Activity:   . Days of Exercise per Week:   . Minutes of Exercise per Session:   Stress:   . Feeling of Stress :   Social Connections:   . Frequency of Communication with Friends and Family:   . Frequency of Social Gatherings with Friends and Family:   . Attends Religious Services:   . Active Member of Clubs or Organizations:   . Attends Archivist Meetings:   Marland Kitchen Marital Status:   Intimate Partner Violence:   . Fear of Current or Ex-Partner:   . Emotionally Abused:   Marland Kitchen Physically Abused:   . Sexually Abused:         Objective:    BP 136/75   Pulse (!) 55   Temp 98.1 F (36.7 C)   Resp 20   Ht 6\' 1"  (1.854 m)   Wt 219 lb (99.3  kg)   SpO2 98%   BMI 28.89 kg/m   Wt Readings from Last 3 Encounters:  05/06/20 219 lb (99.3 kg)  03/19/20 219 lb 6.4 oz (99.5 kg)  01/21/20 220 lb 12.8 oz (100.2 kg)    Physical Exam Vitals reviewed.  Constitutional:      General: He is not in acute distress.    Appearance: Normal appearance. He is overweight. He is not ill-appearing, toxic-appearing or diaphoretic.  HENT:     Head: Normocephalic and atraumatic.  Eyes:     General: No scleral icterus.  Right eye: No discharge.        Left eye: No discharge.     Conjunctiva/sclera: Conjunctivae normal.  Cardiovascular:     Rate and Rhythm: Normal rate and regular rhythm.     Heart sounds: Normal heart sounds. No murmur. No friction rub. No gallop.   Pulmonary:     Effort: Pulmonary effort is normal. No respiratory distress.     Breath sounds: Normal breath sounds. No stridor. No wheezing, rhonchi or rales.  Musculoskeletal:        General: Normal range of motion.     Cervical back: Normal range of motion.  Skin:    General: Skin is warm and dry.  Neurological:     Mental Status: He is alert and oriented to person, place, and time. Mental status is at baseline.  Psychiatric:        Mood and Affect: Mood normal.        Behavior: Behavior normal.        Thought Content: Thought content normal.        Judgment: Judgment normal.     Lab Results  Component Value Date   TSH 1.360 01/10/2019   Lab Results  Component Value Date   WBC 6.3 01/10/2019   HGB 13.8 01/10/2019   HCT 39.3 01/10/2019   MCV 86 01/10/2019   PLT 194 01/10/2019   Lab Results  Component Value Date   NA 137 09/17/2019   K 4.4 09/17/2019   CO2 23 09/17/2019   GLUCOSE 95 09/17/2019   BUN 15 09/17/2019   CREATININE 0.90 09/17/2019   BILITOT 0.6 09/17/2019   ALKPHOS 116 09/17/2019   AST 34 09/17/2019   ALT 19 09/17/2019   PROT 7.4 09/17/2019   ALBUMIN 4.9 09/17/2019   CALCIUM 9.9 09/17/2019   ANIONGAP 9 11/08/2016   Lab Results    Component Value Date   CHOL 141 09/17/2019   Lab Results  Component Value Date   HDL 37 (L) 09/17/2019   Lab Results  Component Value Date   LDLCALC 77 09/17/2019   Lab Results  Component Value Date   TRIG 156 (H) 09/17/2019   Lab Results  Component Value Date   CHOLHDL 3.8 09/17/2019   No results found for: HGBA1C

## 2020-05-30 ENCOUNTER — Other Ambulatory Visit: Payer: Self-pay

## 2020-05-30 DIAGNOSIS — R0600 Dyspnea, unspecified: Secondary | ICD-10-CM

## 2020-05-30 MED ORDER — PROAIR HFA 108 (90 BASE) MCG/ACT IN AERS: 2.0000 | INHALATION_SPRAY | Freq: Four times a day (QID) | RESPIRATORY_TRACT | 2 refills | Status: DC | PRN

## 2020-06-02 ENCOUNTER — Encounter: Payer: Self-pay | Admitting: Nurse Practitioner

## 2020-06-02 ENCOUNTER — Ambulatory Visit (INDEPENDENT_AMBULATORY_CARE_PROVIDER_SITE_OTHER): Payer: Medicare Other

## 2020-06-02 ENCOUNTER — Other Ambulatory Visit: Payer: Self-pay

## 2020-06-02 ENCOUNTER — Ambulatory Visit (INDEPENDENT_AMBULATORY_CARE_PROVIDER_SITE_OTHER): Payer: Medicare Other | Admitting: Nurse Practitioner

## 2020-06-02 VITALS — BP 114/74 | HR 70 | Temp 97.5°F | Resp 20 | Ht 73.0 in | Wt 220.0 lb

## 2020-06-02 DIAGNOSIS — M25572 Pain in left ankle and joints of left foot: Secondary | ICD-10-CM

## 2020-06-02 MED ORDER — CELECOXIB 200 MG PO CAPS
200.0000 mg | ORAL_CAPSULE | Freq: Two times a day (BID) | ORAL | 3 refills | Status: DC
Start: 2020-06-02 — End: 2020-06-18

## 2020-06-02 NOTE — Patient Instructions (Signed)
Ankle Pain The ankle joint holds your body weight and allows you to move around. Ankle pain can occur on either side or the back of one ankle or both ankles. Ankle pain may be sharp and burning or dull and aching. There may be tenderness, stiffness, redness, or warmth around the ankle. Many things can cause ankle pain, including an injury to the area and overuse of the ankle. Follow these instructions at home: Activity  Rest your ankle as told by your health care provider. Avoid any activities that cause ankle pain.  Do not use the injured limb to support your body weight until your health care provider says that you can. Use crutches as told by your health care provider.  Do exercises as told by your health care provider.  Ask your health care provider when it is safe to drive if you have a brace on your ankle. If you have a brace:  Wear the brace as told by your health care provider. Remove it only as told by your health care provider.  Loosen the brace if your toes tingle, become numb, or turn cold and blue.  Keep the brace clean.  If the brace is not waterproof: ? Do not let it get wet. ? Cover it with a watertight covering when you take a bath or shower. If you were given an elastic bandage:   Remove it when you take a bath or a shower.  Try not to move your ankle very much, but wiggle your toes from time to time. This helps to prevent swelling.  Adjust the bandage to make it more comfortable if it feels too tight.  Loosen the bandage if you have numbness or tingling in your foot or if your foot turns cold and blue. Managing pain, stiffness, and swelling   If directed, put ice on the painful area. ? If you have a removable brace or elastic bandage, remove it as told by your health care provider. ? Put ice in a plastic bag. ? Place a towel between your skin and the bag. ? Leave the ice on for 20 minutes, 2-3 times a day.  Move your toes often to avoid stiffness and to  lessen swelling.  Raise (elevate) your ankle above the level of your heart while you are sitting or lying down. General instructions  Record information about your pain. Writing down the following may be helpful for you and your health care provider: ? How often you have ankle pain. ? Where the pain is located. ? What the pain feels like.  If treatment involves wearing a prescribed shoe or insole, make sure you wear it correctly and for as long as told by your health care provider.  Take over-the-counter and prescription medicines only as told by your health care provider.  Keep all follow-up visits as told by your health care provider. This is important. Contact a health care provider if:  Your pain gets worse.  Your pain is not relieved with medicines.  You have a fever or chills.  You are having more trouble with walking.  You have new symptoms. Get help right away if:  Your foot, leg, toes, or ankle: ? Tingles or becomes numb. ? Becomes swollen. ? Turns pale or blue. Summary  Ankle pain can occur on either side or the back of one ankle or both ankles.  Ankle pain may be sharp and burning or dull and aching.  Rest your ankle as told by your health care provider.   If told, apply ice to the area.  Take over-the-counter and prescription medicines only as told by your health care provider. This information is not intended to replace advice given to you by your health care provider. Make sure you discuss any questions you have with your health care provider. Document Revised: 03/20/2019 Document Reviewed: 06/07/2018 Elsevier Patient Education  2020 [employer]  

## 2020-06-02 NOTE — Progress Notes (Signed)
Subjective:    Patient ID: Bradley Fisher, male    DOB: Jul 09, 1966, 54 y.o.   MRN: [id number]   Chief Complaint: Ankle Pain (No injury for 3-4 months)   HPI Left ankle pain for ~2-3 months, denies known injury. Describes pain as sharp/shooting soreness. Takes 10-325 Norco with moderate relief. Prior hx of arthritis in left knee. States pain increases with activity and subsides when resting.    Review of Systems  Constitutional: Negative for diaphoresis.  Eyes: Negative for pain.  Respiratory: Negative for shortness of breath.   Cardiovascular: Negative for chest pain, palpitations and leg swelling.  Gastrointestinal: Negative for abdominal pain.  Endocrine: Negative for polydipsia.  Skin: Negative for rash.  Neurological: Negative for dizziness, weakness and headaches.  Hematological: Does not bruise/bleed easily.  All other systems reviewed and are negative.      Objective:   Physical Exam Constitutional:      Appearance: Normal appearance.  HENT:     Head: Normocephalic.     Right Ear: Tympanic membrane normal.     Left Ear: Tympanic membrane normal.     Nose: Nose normal.     Mouth/Throat:     Mouth: Mucous membranes are moist.  Eyes:     Extraocular Movements: Extraocular movements intact.     Conjunctiva/sclera: Conjunctivae normal.     Pupils: Pupils are equal, round, and reactive to light.  Cardiovascular:     Rate and Rhythm: Normal rate and regular rhythm.  Pulmonary:     Effort: Pulmonary effort is normal.     Breath sounds: Normal breath sounds.  Abdominal:     General: Abdomen is flat. Bowel sounds are normal.     Palpations: Abdomen is soft.  Musculoskeletal:     Cervical back: Normal range of motion and neck supple.     Right foot: Normal.     Left foot: Tenderness present. No swelling, deformity or foot drop.     Comments: FROM with pain on flexion and extension tenderenss med and lat on palpation  Skin:    General: Skin is warm and dry.    Neurological:     Mental Status: He is alert and oriented to person, place, and time.  Psychiatric:        Mood and Affect: Mood normal.        Behavior: Behavior normal.        Thought Content: Thought content normal.        Judgment: Judgment normal.     BP 114/74   Pulse 70   Temp (!) 97.5 F (36.4 C) (Temporal)   Resp 20   Ht 6\' 1"  (1.854 m)   Wt 220 lb (99.8 kg)   SpO2 95%   BMI 29.03 kg/m        Assessment & Plan:   Bradley Fisher in today with chief complaint of Ankle Pain (No injury for 3-4 months)   1. Acute left ankle pain Wear ankle brace when active. REST, ICE, wear brace for compression, Elevate foot whenever possible to decrease any swelling. Take Celebrex as prescribed for pain relief.  - DG Ankle Complete Left    The above assessment and management plan was discussed with the patient. The patient verbalized understanding of and has agreed to the management plan. Patient is aware to call the clinic if symptoms persist or worsen. Patient is aware when to return to the clinic for a follow-up visit. Patient educated on when it is appropriate to  go to the emergency department.   Rayen-Margaret Hassell Done, FNP

## 2020-06-13 DIAGNOSIS — M542 Cervicalgia: Secondary | ICD-10-CM | POA: Diagnosis not present

## 2020-06-13 DIAGNOSIS — M546 Pain in thoracic spine: Secondary | ICD-10-CM | POA: Diagnosis not present

## 2020-06-13 DIAGNOSIS — Z79899 Other long term (current) drug therapy: Secondary | ICD-10-CM | POA: Diagnosis not present

## 2020-06-13 DIAGNOSIS — M5416 Radiculopathy, lumbar region: Secondary | ICD-10-CM | POA: Diagnosis not present

## 2020-06-13 DIAGNOSIS — M62830 Muscle spasm of back: Secondary | ICD-10-CM | POA: Diagnosis not present

## 2020-06-18 ENCOUNTER — Other Ambulatory Visit: Payer: Self-pay

## 2020-06-18 ENCOUNTER — Encounter: Payer: Self-pay | Admitting: Family Medicine

## 2020-06-18 ENCOUNTER — Ambulatory Visit (INDEPENDENT_AMBULATORY_CARE_PROVIDER_SITE_OTHER): Payer: Medicare Other | Admitting: Family Medicine

## 2020-06-18 VITALS — BP 129/78 | HR 55 | Temp 97.8°F | Ht 73.0 in | Wt 226.2 lb

## 2020-06-18 DIAGNOSIS — G8929 Other chronic pain: Secondary | ICD-10-CM

## 2020-06-18 DIAGNOSIS — K219 Gastro-esophageal reflux disease without esophagitis: Secondary | ICD-10-CM

## 2020-06-18 DIAGNOSIS — M545 Low back pain: Secondary | ICD-10-CM

## 2020-06-18 DIAGNOSIS — I10 Essential (primary) hypertension: Secondary | ICD-10-CM

## 2020-06-18 DIAGNOSIS — M159 Polyosteoarthritis, unspecified: Secondary | ICD-10-CM

## 2020-06-18 DIAGNOSIS — J301 Allergic rhinitis due to pollen: Secondary | ICD-10-CM

## 2020-06-18 DIAGNOSIS — F339 Major depressive disorder, recurrent, unspecified: Secondary | ICD-10-CM

## 2020-06-18 DIAGNOSIS — Z87898 Personal history of other specified conditions: Secondary | ICD-10-CM

## 2020-06-18 NOTE — Progress Notes (Signed)
Assessment & Plan:  1. Hypertension, essential - Well controlled on current regimen.   2. Depression, recurrent (Bradley Fisher) - Well controlled on current regimen.   3. Chronic seasonal allergic rhinitis due to pollen - Well controlled on current regimen.   4. Gastroesophageal reflux disease without esophagitis - Well controlled on current regimen.   5. Generalized OA - Advised patient he cannot take both Celebrex and meloxicam. He has chosen meloxicam to continue; Celebrex D/C'd.   6. Chronic midline low back pain without sciatica - Well controlled on current regimen. Managed by [facility].   7. History of seizures - No longer on medication. No seizure activity.    Return in about 6 months (around 12/19/2020) for annual physical w. labs.  Hendricks Limes, MSN, APRN, FNP-C [facility]  Subjective:    Patient ID: Bradley Fisher, male    DOB: Aug 19, 1966, 54 y.o.   MRN: [id number]  Patient Care Team: Loman Brooklyn, FNP as PCP - General (Family Medicine) Gala Romney Cristopher Estimable, MD as Consulting Physician (Gastroenterology) Carole Civil, MD as Consulting Physician (Orthopedic Surgery)   Chief Complaint:  Chief Complaint  Patient presents with  . Arthritis    check up of chronic medical conditions    HPI: Bradley Fisher is a 54 y.o. male presenting on 06/18/2020 for Arthritis (check up of chronic medical conditions)  Blood pressure controlled with amlodipine 10 mg QD and Lisinopril 20 mg QD.   Allergies controlled with Zyrtec 10 mg QD and Flonase 2 sprays each nostril QD.   GERD controlled with Nexium 40 mg QD.  Patient has a h/o of seizures, but reports his neurologist took him off the medication years ago. He has not had any further seizures.   Patient is currently taking both meloxicam 15 mg QD and Celebrex 200 mg BID for arthritis.   Depression controlled with sertraline 100 mg QD.   Depression screen [facility] 2/9 06/18/2020 06/02/2020 05/06/2020    Decreased Interest 2 0 0  Down, Depressed, Hopeless 0 0 0  PHQ - 2 Score 2 0 0  Altered sleeping 0 - 0  Tired, decreased energy 0 - 0  Change in appetite 0 - 0  Feeling bad or failure about yourself  0 - 0  Trouble concentrating 0 - 0  Moving slowly or fidgety/restless 3 - 1  Suicidal thoughts 0 - 0  PHQ-9 Score 5 - 1  Difficult doing work/chores Not difficult at all - -  Some recent data might be hidden   Chronic pain controlled with Norco 10/325 BID PRN and Gabapentin 400 mg QHS. Patient goes to [facility] for pain management.   Taking atorvastatin for cholesterol.   New complaints: None  Social history:  Relevant past medical, surgical, family and social history reviewed and updated as indicated. Interim medical history since our last visit reviewed.  Allergies and medications reviewed and updated.  DATA REVIEWED: CHART IN EPIC  ROS: Negative unless specifically indicated above in HPI.    Current Outpatient Medications:  .  amLODipine (NORVASC) 10 MG tablet, Take 1 tablet (10 mg total) by mouth daily., Disp: 90 tablet, Rfl: 1 .  atorvastatin (LIPITOR) 10 MG tablet, Take 1 tablet (10 mg total) by mouth daily., Disp: 90 tablet, Rfl: 1 .  celecoxib (CELEBREX) 200 MG capsule, Take 1 capsule (200 mg total) by mouth 2 (two) times daily., Disp: 30 capsule, Rfl: 3 .  cetirizine (ZYRTEC) 10 MG tablet, Take 1 tablet (10 mg total) by  mouth daily., Disp: 90 tablet, Rfl: 3 .  esomeprazole (NEXIUM) 40 MG capsule, Take 1 capsule (40 mg total) by mouth daily., Disp: 90 capsule, Rfl: 1 .  fluticasone (FLONASE) 50 MCG/ACT nasal spray, Place 2 sprays into both nostrils daily., Disp: 16 g, Rfl: 6 .  gabapentin (NEURONTIN) 400 MG capsule, Take 1 capsule (400 mg total) by mouth at bedtime. For PAIN, Disp: 90 capsule, Rfl: 1 .  HYDROcodone-acetaminophen (NORCO) 10-325 MG tablet, Take 1 tablet by mouth 2 (two) times daily as needed for severe pain., Disp: 30 tablet, Rfl: 0 .  lisinopril (ZESTRIL)  20 MG tablet, Take 1 tablet (20 mg total) by mouth daily., Disp: 90 tablet, Rfl: 1 .  meloxicam (MOBIC) 15 MG tablet, Take 1 tablet (15 mg total) by mouth daily., Disp: 90 tablet, Rfl: 1 .  PROAIR HFA 108 (90 Base) MCG/ACT inhaler, Inhale 2 puffs into the lungs every 6 (six) hours as needed for wheezing or shortness of breath., Disp: 18 g, Rfl: 2 .  sertraline (ZOLOFT) 100 MG tablet, Take 1 tablet (100 mg total) by mouth daily., Disp: 90 tablet, Rfl: 1   No Known Allergies Past Medical History:  Diagnosis Date  . Arthritis   . Asthma   . Depression   . GERD (gastroesophageal reflux disease)   . Hyperlipidemia   . Hypertension   . Seizures ([place])    unknow etiology; on meds for this; no seizures since on meds 10 years ago    Past Surgical History:  Procedure Laterality Date  . COLONOSCOPY WITH PROPOFOL N/A 05/29/2018   Procedure: COLONOSCOPY WITH PROPOFOL;  Surgeon: Daneil Dolin, MD;  Location: [facility];  Service: Endoscopy;  Laterality: N/A;  9:45am  . KNEE ARTHROSCOPY WITH MEDIAL MENISECTOMY Left 11/11/2016   Procedure: LEFT KNEE ARTHROSCOPY WITH MEDIAL MENISECTOMY;  Surgeon: Carole Civil, MD;  Location: [facility];  Service: Orthopedics;  Laterality: Left;  . Thumb surgery-left  02/25/2020    Social History   Socioeconomic History  . Marital status: Married    Spouse name: Not on file  . Number of children: 0  . Years of education: Not on file  . Highest education level: 7th grade  Occupational History  . Occupation: Disabled  Tobacco Use  . Smoking status: Current Some Day Smoker    Types: Cigars  . Smokeless tobacco: Current User    Types: Snuff  . Tobacco comment: cigars uses 2 per month  Vaping Use  . Vaping Use: Never used  Substance and Sexual Activity  . Alcohol use: Yes    Alcohol/week: 12.0 standard drinks    Types: 12 Cans of beer per week    Comment: drinks some weekends.   . Drug use: No  . Sexual activity: Yes    Birth control/protection: None    Other Topics Concern  . Not on file  Social History Narrative  . Not on file   Social Determinants of Health   Financial Resource Strain:   . Difficulty of Paying Living Expenses:   Food Insecurity:   . Worried About Charity fundraiser in the Last Year:   . Arboriculturist in the Last Year:   Transportation Needs:   . Film/video editor (Medical):   Marland Kitchen Lack of Transportation (Non-Medical):   Physical Activity:   . Days of Exercise per Week:   . Minutes of Exercise per Session:   Stress:   . Feeling of Stress :   Social Connections:   .  Frequency of Communication with Friends and Family:   . Frequency of Social Gatherings with Friends and Family:   . Attends Religious Services:   . Active Member of Clubs or Organizations:   . Attends Archivist Meetings:   Marland Kitchen Marital Status:   Intimate Partner Violence:   . Fear of Current or Ex-Partner:   . Emotionally Abused:   Marland Kitchen Physically Abused:   . Sexually Abused:         Objective:    BP 129/78   Pulse (!) 55   Temp 97.8 F (36.6 C) (Temporal)   Ht 6\' 1"  (1.854 m)   Wt 226 lb 3.2 oz (102.6 kg)   SpO2 98%   BMI 29.84 kg/m   Wt Readings from Last 3 Encounters:  06/18/20 226 lb 3.2 oz (102.6 kg)  06/02/20 220 lb (99.8 kg)  05/06/20 219 lb (99.3 kg)    Physical Exam Vitals reviewed.  Constitutional:      General: He is not in acute distress.    Appearance: Normal appearance. He is overweight. He is not ill-appearing, toxic-appearing or diaphoretic.  HENT:     Head: Normocephalic and atraumatic.  Eyes:     General: No scleral icterus.       Right eye: No discharge.        Left eye: No discharge.     Conjunctiva/sclera: Conjunctivae normal.  Cardiovascular:     Rate and Rhythm: Normal rate and regular rhythm.     Heart sounds: Normal heart sounds. No murmur heard.  No friction rub. No gallop.   Pulmonary:     Effort: Pulmonary effort is normal. No respiratory distress.     Breath sounds: Normal  breath sounds. No stridor. No wheezing, rhonchi or rales.  Musculoskeletal:        General: Normal range of motion.     Cervical back: Normal range of motion.  Skin:    General: Skin is warm and dry.  Neurological:     Mental Status: He is alert and oriented to person, place, and time. Mental status is at baseline.  Psychiatric:        Mood and Affect: Mood normal.        Behavior: Behavior normal.        Thought Content: Thought content normal.        Judgment: Judgment normal.     Lab Results  Component Value Date   TSH 1.360 01/10/2019   Lab Results  Component Value Date   WBC 6.3 01/10/2019   HGB 13.8 01/10/2019   HCT 39.3 01/10/2019   MCV 86 01/10/2019   PLT 194 01/10/2019   Lab Results  Component Value Date   NA 137 09/17/2019   K 4.4 09/17/2019   CO2 23 09/17/2019   GLUCOSE 95 09/17/2019   BUN 15 09/17/2019   CREATININE 0.90 09/17/2019   BILITOT 0.6 09/17/2019   ALKPHOS 116 09/17/2019   AST 34 09/17/2019   ALT 19 09/17/2019   PROT 7.4 09/17/2019   ALBUMIN 4.9 09/17/2019   CALCIUM 9.9 09/17/2019   ANIONGAP 9 11/08/2016   Lab Results  Component Value Date   CHOL 141 09/17/2019   Lab Results  Component Value Date   HDL 37 (L) 09/17/2019   Lab Results  Component Value Date   LDLCALC 77 09/17/2019   Lab Results  Component Value Date   TRIG 156 (H) 09/17/2019   Lab Results  Component Value Date   CHOLHDL 3.8 09/17/2019  No results found for: HGBA1C

## 2020-06-18 NOTE — Patient Instructions (Signed)
Stop taking celecoxib (Celebrex).

## 2020-06-24 ENCOUNTER — Other Ambulatory Visit: Payer: Self-pay | Admitting: *Deleted

## 2020-06-24 MED ORDER — CETIRIZINE HCL 10 MG PO TABS: 10.0000 mg | ORAL_TABLET | Freq: Every day | ORAL | 3 refills | Status: AC

## 2020-07-14 DIAGNOSIS — Z79899 Other long term (current) drug therapy: Secondary | ICD-10-CM | POA: Diagnosis not present

## 2020-07-14 DIAGNOSIS — M5416 Radiculopathy, lumbar region: Secondary | ICD-10-CM | POA: Diagnosis not present

## 2020-07-14 DIAGNOSIS — M62830 Muscle spasm of back: Secondary | ICD-10-CM | POA: Diagnosis not present

## 2020-07-14 DIAGNOSIS — M546 Pain in thoracic spine: Secondary | ICD-10-CM | POA: Diagnosis not present

## 2020-07-14 DIAGNOSIS — M542 Cervicalgia: Secondary | ICD-10-CM | POA: Diagnosis not present

## 2020-07-14 DIAGNOSIS — F17210 Nicotine dependence, cigarettes, uncomplicated: Secondary | ICD-10-CM | POA: Diagnosis not present

## 2020-08-14 DIAGNOSIS — M546 Pain in thoracic spine: Secondary | ICD-10-CM | POA: Diagnosis not present

## 2020-08-14 DIAGNOSIS — Z79899 Other long term (current) drug therapy: Secondary | ICD-10-CM | POA: Diagnosis not present

## 2020-08-14 DIAGNOSIS — M542 Cervicalgia: Secondary | ICD-10-CM | POA: Diagnosis not present

## 2020-08-14 DIAGNOSIS — F17210 Nicotine dependence, cigarettes, uncomplicated: Secondary | ICD-10-CM | POA: Diagnosis not present

## 2020-08-14 DIAGNOSIS — M5416 Radiculopathy, lumbar region: Secondary | ICD-10-CM | POA: Diagnosis not present

## 2020-08-14 DIAGNOSIS — M62830 Muscle spasm of back: Secondary | ICD-10-CM | POA: Diagnosis not present

## 2020-08-15 DIAGNOSIS — M25552 Pain in left hip: Secondary | ICD-10-CM | POA: Diagnosis not present

## 2020-08-15 DIAGNOSIS — M545 Low back pain: Secondary | ICD-10-CM | POA: Diagnosis not present

## 2020-08-15 DIAGNOSIS — M25551 Pain in right hip: Secondary | ICD-10-CM | POA: Diagnosis not present

## 2020-08-22 DIAGNOSIS — M545 Low back pain: Secondary | ICD-10-CM | POA: Diagnosis not present

## 2020-08-25 DIAGNOSIS — M545 Low back pain: Secondary | ICD-10-CM | POA: Diagnosis not present

## 2020-09-01 DIAGNOSIS — M5416 Radiculopathy, lumbar region: Secondary | ICD-10-CM | POA: Diagnosis not present

## 2020-09-08 DIAGNOSIS — M5416 Radiculopathy, lumbar region: Secondary | ICD-10-CM | POA: Diagnosis not present

## 2020-09-12 DIAGNOSIS — F17210 Nicotine dependence, cigarettes, uncomplicated: Secondary | ICD-10-CM | POA: Diagnosis not present

## 2020-09-12 DIAGNOSIS — M62830 Muscle spasm of back: Secondary | ICD-10-CM | POA: Diagnosis not present

## 2020-09-12 DIAGNOSIS — Z79899 Other long term (current) drug therapy: Secondary | ICD-10-CM | POA: Diagnosis not present

## 2020-09-12 DIAGNOSIS — M5416 Radiculopathy, lumbar region: Secondary | ICD-10-CM | POA: Diagnosis not present

## 2020-09-12 DIAGNOSIS — M546 Pain in thoracic spine: Secondary | ICD-10-CM | POA: Diagnosis not present

## 2020-09-12 DIAGNOSIS — M542 Cervicalgia: Secondary | ICD-10-CM | POA: Diagnosis not present

## 2020-09-15 DIAGNOSIS — M5416 Radiculopathy, lumbar region: Secondary | ICD-10-CM | POA: Diagnosis not present

## 2020-10-01 DIAGNOSIS — M545 Low back pain, unspecified: Secondary | ICD-10-CM | POA: Diagnosis not present

## 2020-10-01 DIAGNOSIS — M5416 Radiculopathy, lumbar region: Secondary | ICD-10-CM | POA: Diagnosis not present

## 2020-10-07 DIAGNOSIS — M5416 Radiculopathy, lumbar region: Secondary | ICD-10-CM | POA: Diagnosis not present

## 2020-10-10 DIAGNOSIS — M5416 Radiculopathy, lumbar region: Secondary | ICD-10-CM | POA: Diagnosis not present

## 2020-10-10 DIAGNOSIS — Z720 Tobacco use: Secondary | ICD-10-CM | POA: Diagnosis not present

## 2020-10-10 DIAGNOSIS — M546 Pain in thoracic spine: Secondary | ICD-10-CM | POA: Diagnosis not present

## 2020-10-10 DIAGNOSIS — E559 Vitamin D deficiency, unspecified: Secondary | ICD-10-CM | POA: Diagnosis not present

## 2020-10-10 DIAGNOSIS — Z79899 Other long term (current) drug therapy: Secondary | ICD-10-CM | POA: Diagnosis not present

## 2020-10-10 DIAGNOSIS — M542 Cervicalgia: Secondary | ICD-10-CM | POA: Diagnosis not present

## 2020-10-29 DIAGNOSIS — M5416 Radiculopathy, lumbar region: Secondary | ICD-10-CM | POA: Diagnosis not present

## 2020-11-10 DIAGNOSIS — G629 Polyneuropathy, unspecified: Secondary | ICD-10-CM | POA: Diagnosis not present

## 2020-11-10 DIAGNOSIS — M546 Pain in thoracic spine: Secondary | ICD-10-CM | POA: Diagnosis not present

## 2020-11-10 DIAGNOSIS — M5416 Radiculopathy, lumbar region: Secondary | ICD-10-CM | POA: Diagnosis not present

## 2020-11-10 DIAGNOSIS — M542 Cervicalgia: Secondary | ICD-10-CM | POA: Diagnosis not present

## 2020-11-10 DIAGNOSIS — Z79899 Other long term (current) drug therapy: Secondary | ICD-10-CM | POA: Diagnosis not present

## 2020-11-10 DIAGNOSIS — F17210 Nicotine dependence, cigarettes, uncomplicated: Secondary | ICD-10-CM | POA: Diagnosis not present

## 2020-11-14 DIAGNOSIS — M48062 Spinal stenosis, lumbar region with neurogenic claudication: Secondary | ICD-10-CM | POA: Diagnosis not present

## 2020-11-14 DIAGNOSIS — I10 Essential (primary) hypertension: Secondary | ICD-10-CM | POA: Diagnosis not present

## 2020-11-18 ENCOUNTER — Other Ambulatory Visit: Payer: Self-pay | Admitting: Family Medicine

## 2020-11-18 ENCOUNTER — Other Ambulatory Visit: Payer: Self-pay | Admitting: Nurse Practitioner

## 2020-11-18 DIAGNOSIS — K219 Gastro-esophageal reflux disease without esophagitis: Secondary | ICD-10-CM

## 2020-11-25 DIAGNOSIS — M48062 Spinal stenosis, lumbar region with neurogenic claudication: Secondary | ICD-10-CM | POA: Diagnosis not present

## 2020-12-03 ENCOUNTER — Encounter: Payer: Self-pay | Admitting: *Deleted

## 2020-12-10 DIAGNOSIS — M546 Pain in thoracic spine: Secondary | ICD-10-CM | POA: Diagnosis not present

## 2020-12-10 DIAGNOSIS — Z79899 Other long term (current) drug therapy: Secondary | ICD-10-CM | POA: Diagnosis not present

## 2020-12-10 DIAGNOSIS — M62830 Muscle spasm of back: Secondary | ICD-10-CM | POA: Diagnosis not present

## 2020-12-10 DIAGNOSIS — M5416 Radiculopathy, lumbar region: Secondary | ICD-10-CM | POA: Diagnosis not present

## 2020-12-10 DIAGNOSIS — M542 Cervicalgia: Secondary | ICD-10-CM | POA: Diagnosis not present

## 2020-12-19 ENCOUNTER — Encounter: Payer: Medicare Other | Admitting: Family Medicine

## 2021-01-07 ENCOUNTER — Encounter: Payer: Medicare Other | Admitting: Family Medicine

## 2021-01-08 ENCOUNTER — Encounter: Payer: Medicare Other | Admitting: Family Medicine

## 2021-01-22 ENCOUNTER — Encounter: Payer: Self-pay | Admitting: Family Medicine

## 2021-01-22 ENCOUNTER — Other Ambulatory Visit: Payer: Self-pay

## 2021-01-22 ENCOUNTER — Ambulatory Visit (INDEPENDENT_AMBULATORY_CARE_PROVIDER_SITE_OTHER): Payer: 59 | Admitting: Family Medicine

## 2021-01-22 VITALS — BP 133/87 | HR 59 | Temp 98.7°F | Ht 73.0 in | Wt 222.2 lb

## 2021-01-22 DIAGNOSIS — F339 Major depressive disorder, recurrent, unspecified: Secondary | ICD-10-CM

## 2021-01-22 DIAGNOSIS — R0600 Dyspnea, unspecified: Secondary | ICD-10-CM

## 2021-01-22 DIAGNOSIS — M159 Polyosteoarthritis, unspecified: Secondary | ICD-10-CM

## 2021-01-22 DIAGNOSIS — Z0001 Encounter for general adult medical examination with abnormal findings: Secondary | ICD-10-CM

## 2021-01-22 DIAGNOSIS — Z720 Tobacco use: Secondary | ICD-10-CM | POA: Insufficient documentation

## 2021-01-22 DIAGNOSIS — G8929 Other chronic pain: Secondary | ICD-10-CM

## 2021-01-22 DIAGNOSIS — I10 Essential (primary) hypertension: Secondary | ICD-10-CM | POA: Diagnosis not present

## 2021-01-22 DIAGNOSIS — Z0000 Encounter for general adult medical examination without abnormal findings: Secondary | ICD-10-CM

## 2021-01-22 DIAGNOSIS — J301 Allergic rhinitis due to pollen: Secondary | ICD-10-CM

## 2021-01-22 DIAGNOSIS — K219 Gastro-esophageal reflux disease without esophagitis: Secondary | ICD-10-CM

## 2021-01-22 DIAGNOSIS — M545 Low back pain, unspecified: Secondary | ICD-10-CM

## 2021-01-22 MED ORDER — ESOMEPRAZOLE MAGNESIUM 40 MG PO CPDR: 40.0000 mg | DELAYED_RELEASE_CAPSULE | Freq: Every day | ORAL | 3 refills | Status: DC

## 2021-01-22 MED ORDER — LISINOPRIL 20 MG PO TABS: 20.0000 mg | ORAL_TABLET | Freq: Every day | ORAL | 3 refills | Status: DC

## 2021-01-22 MED ORDER — SERTRALINE HCL 100 MG PO TABS: 100.0000 mg | ORAL_TABLET | Freq: Every day | ORAL | 3 refills | Status: DC

## 2021-01-22 MED ORDER — PROAIR HFA 108 (90 BASE) MCG/ACT IN AERS: 2.0000 | INHALATION_SPRAY | Freq: Four times a day (QID) | RESPIRATORY_TRACT | 2 refills | Status: DC | PRN

## 2021-01-22 MED ORDER — AMLODIPINE BESYLATE 10 MG PO TABS: 10.0000 mg | ORAL_TABLET | Freq: Every day | ORAL | 3 refills | Status: DC

## 2021-01-22 NOTE — Progress Notes (Signed)
Assessment & Plan:  1. Well adult exam Preventive health education provided.  Patient made aware that he is due for his second Shingrix and booster of COVID-19. - CBC with Differential/Platelet - CMP14+EGFR - Lipid panel - PSA, total and free  2. Depression, recurrent ([place]) Well controlled on current regimen.  - sertraline (ZOLOFT) 100 MG tablet; Take 1 tablet (100 mg total) by mouth daily.  Dispense: 90 tablet; Refill: 3 - CMP14+EGFR  3. Hypertension, essential Well controlled on current regimen.  - amLODipine (NORVASC) 10 MG tablet; Take 1 tablet (10 mg total) by mouth daily.  Dispense: 90 tablet; Refill: 3 - lisinopril (ZESTRIL) 20 MG tablet; Take 1 tablet (20 mg total) by mouth daily.  Dispense: 90 tablet; Refill: 3 - CBC with Differential/Platelet - CMP14+EGFR - Lipid panel  4. Dyspnea, unspecified type Well controlled on current regimen.  - PROAIR HFA 108 (90 Base) MCG/ACT inhaler; Inhale 2 puffs into the lungs every 6 (six) hours as needed for wheezing or shortness of breath.  Dispense: 18 g; Refill: 2  5. Gastroesophageal reflux disease without esophagitis Well controlled on current regimen.  - esomeprazole (NEXIUM) 40 MG capsule; Take 1 capsule (40 mg total) by mouth daily.  Dispense: 90 capsule; Refill: 3 - CMP14+EGFR  6. Chronic seasonal allergic rhinitis due to pollen Well controlled on current regimen.   7. Chronic midline low back pain without sciatica Managed by pain management. - CMP14+EGFR  8. Generalized OA Managed by pain management. - CMP14+EGFR  9. Tobacco use - CT CHEST LUNG CA SCREEN LOW DOSE W/O CM; Future   Follow-up: Return in about 1 year (around 01/22/2022) for annual physical.   Hendricks Limes, MSN, APRN, FNP-C [facility]  Subjective:  Patient ID: Bradley Fisher, male    DOB: 1966/09/25  Age: 55 y.o. MRN: [id number]  Patient Care Team: Loman Brooklyn, FNP as PCP - General (Family Medicine) Gala Romney Cristopher Estimable, MD as Consulting Physician (Gastroenterology) Carole Civil, MD as Consulting Physician (Orthopedic Surgery)   CC:  Chief Complaint  Patient presents with  . Annual Exam    HPI Bradley Fisher presents for his annual physical.  Occupation: [employer] farm manager, Marital status: Married, Substance use: None Diet: Regular diet, Exercise: Walking Last eye exam: Last year Last dental exam: Does not have teeth Last colonoscopy: 2019 Lung Cancer Screening with low-dose Chest CT: Interested Hepatitis C Screening: Declined PSA: 2019  Immunizations: Flu Vaccine: up to date Tdap Vaccine: up to date  Shingrix Vaccine: has received one dose  COVID-19 Vaccine: has received 2 doses  DEPRESSION SCREENING PHQ 2/9 Scores 01/22/2021 06/18/2020 06/02/2020 05/06/2020 03/19/2020 01/21/2020 12/18/2019  PHQ - 2 Score 0 2 0 0 0 0 0  PHQ- 9 Score 0 5 - 1 1 - 0     Review of Systems  Constitutional: Negative for chills, fever, malaise/fatigue and weight loss.  HENT: Negative for congestion, ear discharge, ear pain, nosebleeds, sinus pain, sore throat and tinnitus.   Eyes: Negative for blurred vision, double vision, pain, discharge and redness.  Respiratory: Negative for cough, shortness of breath and wheezing.   Cardiovascular: Negative for chest pain, palpitations and leg swelling.  Gastrointestinal: Negative for abdominal pain, constipation, diarrhea, heartburn, nausea and vomiting.  Genitourinary: Negative for dysuria, frequency and urgency.  Musculoskeletal: Negative for myalgias.  Skin: Negative for rash.  Neurological: Negative for dizziness, seizures, weakness and headaches.  Psychiatric/Behavioral: Negative for depression, substance abuse and suicidal ideas. The patient is  not nervous/anxious.      Current Outpatient Medications:  .  amLODipine (NORVASC) 10 MG tablet, Take 1 tablet (10 mg total) by mouth daily., Disp: 90 tablet, Rfl: 1 .  celecoxib (CELEBREX) 200 MG capsule, Take 200 mg by  mouth daily., Disp: , Rfl:  .  cetirizine (ZYRTEC) 10 MG tablet, Take 1 tablet (10 mg total) by mouth daily., Disp: 90 tablet, Rfl: 3 .  esomeprazole (NEXIUM) 40 MG capsule, Take 1 capsule (40 mg total) by mouth daily., Disp: 90 capsule, Rfl: 0 .  fluticasone (FLONASE) 50 MCG/ACT nasal spray, Place 2 sprays into both nostrils daily., Disp: 16 g, Rfl: 6 .  gabapentin (NEURONTIN) 400 MG capsule, Take 1 capsule (400 mg total) by mouth at bedtime. For PAIN (Patient taking differently: Take 400 mg by mouth 2 (two) times daily. For PAIN), Disp: 90 capsule, Rfl: 1 .  HYDROcodone-acetaminophen (NORCO) 10-325 MG tablet, Take 1 tablet by mouth 2 (two) times daily as needed for severe pain., Disp: 30 tablet, Rfl: 0 .  lisinopril (ZESTRIL) 20 MG tablet, Take 1 tablet (20 mg total) by mouth daily., Disp: 90 tablet, Rfl: 1 .  meloxicam (MOBIC) 15 MG tablet, Take 1 tablet (15 mg total) by mouth daily., Disp: 90 tablet, Rfl: 1 .  methocarbamol (ROBAXIN) 750 MG tablet, Take 750 mg by mouth 4 (four) times daily., Disp: , Rfl:  .  naloxone (NARCAN) nasal spray 4 mg/0.1 mL, Place 1 spray into the nose., Disp: , Rfl:  .  PROAIR HFA 108 (90 Base) MCG/ACT inhaler, Inhale 2 puffs into the lungs every 6 (six) hours as needed for wheezing or shortness of breath., Disp: 18 g, Rfl: 2 .  sertraline (ZOLOFT) 100 MG tablet, Take 1 tablet (100 mg total) by mouth daily., Disp: 90 tablet, Rfl: 1  No Known Allergies  Past Medical History:  Diagnosis Date  . Arthritis   . Asthma   . Depression   . GERD (gastroesophageal reflux disease)   . Hyperlipidemia   . Hypertension   . Seizures ([place])    unknow etiology; on meds for this; no seizures since on meds 10 years ago    Past Surgical History:  Procedure Laterality Date  . COLONOSCOPY WITH PROPOFOL N/A 05/29/2018   Procedure: COLONOSCOPY WITH PROPOFOL;  Surgeon: Daneil Dolin, MD;  [facility];  Service: Endoscopy;  Laterality: N/A;  9:45am  . KNEE ARTHROSCOPY  WITH MEDIAL MENISECTOMY Left 11/11/2016   Procedure: LEFT KNEE ARTHROSCOPY WITH MEDIAL MENISECTOMY;  Surgeon: Carole Civil, MD;  Location: [facility];  Service: Orthopedics;  Laterality: Left;  . Thumb surgery-left  02/25/2020    Family History  Problem Relation Age of Onset  . Hypertension Mother   . Diabetes Mother   . Hypertension Father   . Diabetes Father   . Diabetes Brother   . Colon cancer Neg Hx     Social History   Socioeconomic History  . Marital status: Married    Spouse name: Not on file  . Number of children: 0  . Years of education: Not on file  . Highest education level: 7th grade  Occupational History  . Occupation: Disabled  Tobacco Use  . Smoking status: Current Some Day Smoker    Types: Cigars  . Smokeless tobacco: Current User    Types: Snuff  . Tobacco comment: cigars uses 2 per month  Vaping Use  . Vaping Use: Never used  Substance and Sexual Activity  . Alcohol use: Yes  Alcohol/week: 12.0 standard drinks    Types: 12 Cans of beer per week    Comment: drinks some weekends.   . Drug use: No  . Sexual activity: Yes    Birth control/protection: None  Other Topics Concern  . Not on file  Social History Narrative  . Not on file   Social Determinants of Health   Financial Resource Strain: Not on file  Food Insecurity: Not on file  Transportation Needs: Not on file  Physical Activity: Not on file  Stress: Not on file  Social Connections: Not on file  Intimate Partner Violence: Not on file      Objective:    BP 133/87   Pulse (!) 59   Temp 98.7 F (37.1 C) (Temporal)   Ht 6' 1" (1.854 m)   Wt 222 lb 3.2 oz (100.8 kg)   SpO2 97%   BMI 29.32 kg/m   Wt Readings from Last 3 Encounters:  01/22/21 222 lb 3.2 oz (100.8 kg)  06/18/20 226 lb 3.2 oz (102.6 kg)  06/02/20 220 lb (99.8 kg)    Physical Exam Vitals reviewed.  Constitutional:      General: He is not in acute distress.    Appearance: Normal appearance. He is  overweight. He is not ill-appearing, toxic-appearing or diaphoretic.  HENT:     Head: Normocephalic and atraumatic.     Right Ear: Tympanic membrane, ear canal and external ear normal. There is no impacted cerumen.     Left Ear: Tympanic membrane, ear canal and external ear normal. There is no impacted cerumen.     Nose: Nose normal. No congestion or rhinorrhea.     Mouth/Throat:     Mouth: Mucous membranes are moist.     Pharynx: Oropharynx is clear. No oropharyngeal exudate or posterior oropharyngeal erythema.     Comments: No teeth. Eyes:     General: No scleral icterus.       Right eye: No discharge.        Left eye: No discharge.     Conjunctiva/sclera: Conjunctivae normal.     Pupils: Pupils are equal, round, and reactive to light.  Cardiovascular:     Rate and Rhythm: Normal rate and regular rhythm.     Heart sounds: Normal heart sounds. No murmur heard. No friction rub. No gallop.   Pulmonary:     Effort: Pulmonary effort is normal. No respiratory distress.     Breath sounds: Normal breath sounds. No stridor. No wheezing, rhonchi or rales.  Abdominal:     General: Abdomen is flat. Bowel sounds are normal. There is no distension.     Palpations: Abdomen is soft. There is no mass.     Tenderness: There is no abdominal tenderness. There is no guarding or rebound.     Hernia: No hernia is present.  Musculoskeletal:        General: Normal range of motion.     Cervical back: Normal range of motion and neck supple. No rigidity. No muscular tenderness.     Right lower leg: No edema.     Left lower leg: No edema.  Lymphadenopathy:     Cervical: No cervical adenopathy.  Skin:    General: Skin is warm and dry.     Capillary Refill: Capillary refill takes less than 2 seconds.  Neurological:     General: No focal deficit present.     Mental Status: He is alert and oriented to person, place, and time. Mental status is at  baseline.  Psychiatric:        Mood and Affect: Mood normal.         Behavior: Behavior normal.        Thought Content: Thought content normal.        Judgment: Judgment normal.     Lab Results  Component Value Date   TSH 1.360 01/10/2019   Lab Results  Component Value Date   WBC 6.3 01/10/2019   HGB 13.8 01/10/2019   HCT 39.3 01/10/2019   MCV 86 01/10/2019   PLT 194 01/10/2019   Lab Results  Component Value Date   NA 137 09/17/2019   K 4.4 09/17/2019   CO2 23 09/17/2019   GLUCOSE 95 09/17/2019   BUN 15 09/17/2019   CREATININE 0.90 09/17/2019   BILITOT 0.6 09/17/2019   ALKPHOS 116 09/17/2019   AST 34 09/17/2019   ALT 19 09/17/2019   PROT 7.4 09/17/2019   ALBUMIN 4.9 09/17/2019   CALCIUM 9.9 09/17/2019   ANIONGAP 9 11/08/2016   Lab Results  Component Value Date   CHOL 141 09/17/2019   Lab Results  Component Value Date   HDL 37 (L) 09/17/2019   Lab Results  Component Value Date   LDLCALC 77 09/17/2019   Lab Results  Component Value Date   TRIG 156 (H) 09/17/2019   Lab Results  Component Value Date   CHOLHDL 3.8 09/17/2019   No results found for: HGBA1C

## 2021-01-22 NOTE — Patient Instructions (Signed)
You have not had your 2nd shingles vaccine or your 3rd COVID-19 vaccine.   Preventive Care 3-56 Years Old, Male Preventive care refers to lifestyle choices and visits with your health care provider that can promote health and wellness. This includes:  A yearly physical exam. This is also called an annual wellness visit.  Regular dental and eye exams.  Immunizations.  Screening for certain conditions.  Healthy lifestyle choices, such as: ? Eating a healthy diet. ? Getting regular exercise. ? Not using drugs or products that contain nicotine and tobacco. ? Limiting alcohol use. What can I expect for my preventive care visit? Physical exam Your health care provider will check your:  Height and weight. These may be used to calculate your BMI (body mass index). BMI is a measurement that tells if you are at a healthy weight.  Heart rate and blood pressure.  Body temperature.  Skin for abnormal spots. Counseling Your health care provider may ask you questions about your:  Past medical problems.  Family's medical history.  Alcohol, tobacco, and drug use.  Emotional well-being.  Home life and relationship well-being.  Sexual activity.  Diet, exercise, and sleep habits.  Work and work Statistician.  Access to firearms. What immunizations do I need? Vaccines are usually given at various ages, according to a schedule. Your health care provider will recommend vaccines for you based on your age, medical history, and lifestyle or other factors, such as travel or where you work.   What tests do I need? Blood tests  Lipid and cholesterol levels. These may be checked every 5 years, or more often if you are over 20 years old.  Hepatitis C test.  Hepatitis B test. Screening  Lung cancer screening. You may have this screening every year starting at age 28 if you have a 30-pack-year history of smoking and currently smoke or have quit within the past 15 years.  Prostate  cancer screening. Recommendations will vary depending on your family history and other risks.  Genital exam to check for testicular cancer or hernias.  Colorectal cancer screening. ? All adults should have this screening starting at age 50 and continuing until age 59. ? Your health care provider may recommend screening at age 84 if you are at increased risk. ? You will have tests every 1-10 years, depending on your results and the type of screening test.  Diabetes screening. ? This is done by checking your blood sugar (glucose) after you have not eaten for a while (fasting). ? You may have this done every 1-3 years.  STD (sexually transmitted disease) testing, if you are at risk. Follow these instructions at home: Eating and drinking  Eat a diet that includes fresh fruits and vegetables, whole grains, lean protein, and low-fat dairy products.  Take vitamin and mineral supplements as recommended by your health care provider.  Do not drink alcohol if your health care provider tells you not to drink.  If you drink alcohol: ? Limit how much you have to 0-2 drinks a day. ? Be aware of how much alcohol is in your drink. In the [place] one drink equals one 12 oz bottle of beer (355 mL), one 5 oz glass of wine (148 mL), or one 1 oz glass of hard liquor (44 mL).   Lifestyle  Take daily care of your teeth and gums. Brush your teeth every morning and night with fluoride toothpaste. Floss one time each day.  Stay active. Exercise for at least 30 minutes 5  or more days each week.  Do not use any products that contain nicotine or tobacco, such as cigarettes, e-cigarettes, and chewing tobacco. If you need help quitting, ask your health care provider.  Do not use drugs.  If you are sexually active, practice safe sex. Use a condom or other form of protection to prevent STIs (sexually transmitted infections).  If told by your health care provider, take low-dose aspirin daily starting at age  82.  Find healthy ways to cope with stress, such as: ? Meditation, yoga, or listening to music. ? Journaling. ? Talking to a trusted person. ? Spending time with friends and family. Safety  Always wear your seat belt while driving or riding in a vehicle.  Do not drive: ? If you have been drinking alcohol. Do not ride with someone who has been drinking. ? When you are tired or distracted. ? While texting.  Wear a helmet and other protective equipment during sports activities.  If you have firearms in your house, make sure you follow all gun safety procedures. What's next?  Go to your health care provider once a year for an annual wellness visit.  Ask your health care provider how often you should have your eyes and teeth checked.  Stay up to date on all vaccines. This information is not intended to replace advice given to you by your health care provider. Make sure you discuss any questions you have with your health care provider. Document Revised: 08/28/2019 Document Reviewed: 11/23/2018 Elsevier Patient Education  2021 [employer].

## 2021-01-23 ENCOUNTER — Telehealth: Payer: Self-pay | Admitting: Family Medicine

## 2021-01-23 LAB — CMP14+EGFR
ALT: 16 IU/L (ref 0–44)
AST: 22 IU/L (ref 0–40)
Albumin/Globulin Ratio: 2 (ref 1.2–2.2)
Albumin: 4.7 g/dL (ref 3.8–4.9)
Alkaline Phosphatase: 116 IU/L (ref 44–121)
BUN/Creatinine Ratio: 19 (ref 9–20)
BUN: 14 mg/dL (ref 6–24)
Bilirubin Total: 0.3 mg/dL (ref 0.0–1.2)
CO2: 22 mmol/L (ref 20–29)
Calcium: 9.6 mg/dL (ref 8.7–10.2)
Chloride: 99 mmol/L (ref 96–106)
Creatinine, Ser: 0.73 mg/dL — ABNORMAL LOW (ref 0.76–1.27)
GFR calc Af Amer: 121 mL/min/{1.73_m2} (ref >59)
GFR calc non Af Amer: 104 mL/min/{1.73_m2} (ref >59)
Globulin, Total: 2.4 g/dL (ref 1.5–4.5)
Glucose: 85 mg/dL (ref 65–99)
Potassium: 4.2 mmol/L (ref 3.5–5.2)
Sodium: 138 mmol/L (ref 134–144)
Total Protein: 7.1 g/dL (ref 6.0–8.5)

## 2021-01-23 LAB — CBC WITH DIFFERENTIAL/PLATELET
Basophils Absolute: 0.1 10*3/uL (ref 0.0–0.2)
Basos: 1 %
EOS (ABSOLUTE): 0.2 10*3/uL (ref 0.0–0.4)
Eos: 3 %
Hematocrit: 40.7 % (ref 37.5–51.0)
Hemoglobin: 14.4 g/dL (ref 13.0–17.7)
Immature Grans (Abs): 0 10*3/uL (ref 0.0–0.1)
Immature Granulocytes: 0 %
Lymphocytes Absolute: 2.4 10*3/uL (ref 0.7–3.1)
Lymphs: 33 %
MCH: 30 pg (ref 26.6–33.0)
MCHC: 35.4 g/dL (ref 31.5–35.7)
MCV: 85 fL (ref 79–97)
Monocytes Absolute: 0.4 10*3/uL (ref 0.1–0.9)
Monocytes: 6 %
Neutrophils Absolute: 4.1 10*3/uL (ref 1.4–7.0)
Neutrophils: 57 %
Platelets: 237 10*3/uL (ref 150–450)
RBC: 4.8 x10E6/uL (ref 4.14–5.80)
RDW: 12.8 % (ref 11.6–15.4)
WBC: 7.2 10*3/uL (ref 3.4–10.8)

## 2021-01-23 LAB — LIPID PANEL
Chol/HDL Ratio: 4.2 ratio (ref 0.0–5.0)
Cholesterol, Total: 167 mg/dL (ref 100–199)
HDL: 40 mg/dL (ref >39)
LDL Chol Calc (NIH): 90 mg/dL (ref 0–99)
Triglycerides: 216 mg/dL — ABNORMAL HIGH (ref 0–149)
VLDL Cholesterol Cal: 37 mg/dL (ref 5–40)

## 2021-01-23 LAB — PSA, TOTAL AND FREE
PSA, Free Pct: 32.5 %
PSA, Free: 0.13 ng/mL
Prostate Specific Ag, Serum: 0.4 ng/mL (ref 0.0–4.0)

## 2021-03-13 ENCOUNTER — Telehealth: Payer: Self-pay

## 2021-03-13 NOTE — Telephone Encounter (Signed)
That is the generic for Zyrtec.  Insurance likely does not want to cover because it is an over-the-counter medication.  Please encourage him to purchase over-the-counter and just buy a generic for a couple of dollars.

## 2021-03-13 NOTE — Telephone Encounter (Signed)
Patient aware and verbalized understanding. °

## 2021-03-13 NOTE — Telephone Encounter (Signed)
Patient's Cetirizine is too expensive and he cannot afford, would like to know if you will prescribe something else instead, send to [facility].

## 2021-03-30 ENCOUNTER — Other Ambulatory Visit: Payer: Self-pay | Admitting: Family Medicine

## 2021-03-30 NOTE — Telephone Encounter (Signed)
Last office visit 01/22/21  Not on current med list Mentioned tasking in office visit note of 06/18/20, not mentioned taking in 01/22/21 office visit note  Last refill 03/19/20, #90, 3 refills

## 2021-04-13 ENCOUNTER — Ambulatory Visit (HOSPITAL_COMMUNITY)
Admission: RE | Admit: 2021-04-13 | Discharge: 2021-04-13 | Disposition: A | Discharge status: Home / Self Care | Payer: 59 | Source: Ambulatory Visit | Attending: Family Medicine | Admitting: Family Medicine

## 2021-04-13 DIAGNOSIS — Z720 Tobacco use: Secondary | ICD-10-CM

## 2021-05-20 ENCOUNTER — Encounter: Payer: Self-pay | Admitting: Nurse Practitioner

## 2021-05-20 ENCOUNTER — Ambulatory Visit (INDEPENDENT_AMBULATORY_CARE_PROVIDER_SITE_OTHER): Payer: 59 | Admitting: Nurse Practitioner

## 2021-05-20 ENCOUNTER — Other Ambulatory Visit: Payer: Self-pay

## 2021-05-20 VITALS — BP 128/81 | HR 51 | Temp 97.6°F | Ht 73.0 in | Wt 217.0 lb

## 2021-05-20 DIAGNOSIS — L0291 Cutaneous abscess, unspecified: Secondary | ICD-10-CM | POA: Diagnosis not present

## 2021-05-20 MED ORDER — CEPHALEXIN 500 MG PO CAPS: 500.0000 mg | ORAL_CAPSULE | Freq: Two times a day (BID) | ORAL | 0 refills | Status: DC

## 2021-05-20 NOTE — Assessment & Plan Note (Signed)
Symptoms not well managed, keflex 500 mg tablet by mouth twice daily, abscess I&D, cleaned, and dressed, patient tolerated well, education provided to watch for s/s of infection. Follow up with worsening or unresolved symptoms, patient verbalize understanding.

## 2021-05-20 NOTE — Patient Instructions (Addendum)
Skin Abscess  A skin abscess is an infected area of your skin that contains pus and other material. An abscess can happen in any part of your body. Some abscesses break open (rupture) on their own. Most continue to get worse unless they are treated. The infection can spread deeper into the body and into your blood, which can make you feel sick. A skin abscess is caused by germs that enter the skin through a cut or scrape. It can also be caused by blocked oil and sweat glands or infected hair follicles. This condition is usually treated by:  Draining the pus.  Taking antibiotic medicines.  Placing a warm, wet washcloth over the abscess. Follow these instructions at home: Medicines  Take over-the-counter and prescription medicines only as told by your doctor.  If you were prescribed an antibiotic medicine, take it as told by your doctor. Do not stop taking the antibiotic even if you start to feel better.   Abscess care  If you have an abscess that has not drained, place a warm, clean, wet washcloth over the abscess several times a day. Do this as told by your doctor.  Follow instructions from your doctor about how to take care of your abscess. Make sure you: ? Cover the abscess with a bandage (dressing). ? Change your bandage or gauze as told by your doctor. ? Wash your hands with soap and water before you change the bandage or gauze. If you cannot use soap and water, use hand sanitizer.  Check your abscess every day for signs that the infection is getting worse. Check for: ? More redness, swelling, or pain. ? More fluid or blood. ? Warmth. ? More pus or a bad smell.   General instructions  To avoid spreading the infection: ? Do not share personal care items, towels, or hot tubs with others. ? Avoid making skin-to-skin contact with other people.  Keep all follow-up visits as told by your doctor. This is important. Contact a doctor if:  You have more redness, swelling, or pain  around your abscess.  You have more fluid or blood coming from your abscess.  Your abscess feels warm when you touch it.  You have more pus or a bad smell coming from your abscess.  You have a fever.  Your muscles ache.  You have chills.  You feel sick. Get help right away if:  You have very bad (severe) pain.  You see red streaks on your skin spreading away from the abscess. Summary  A skin abscess is an infected area of your skin that contains pus and other material.  The abscess is caused by germs that enter the skin through a cut or scrape. It can also be caused by blocked oil and sweat glands or infected hair follicles.  Follow your doctor's instructions on caring for your abscess, taking medicines, preventing infections, and keeping follow-up visits. This information is not intended to replace advice given to you by your health care provider. Make sure you discuss any questions you have with your health care provider. Document Revised: 07/05/2019 Document Reviewed: 01/12/2018 Elsevier Patient Education  2021 [employer].

## 2021-05-20 NOTE — Progress Notes (Signed)
Acute Office Visit  Subjective:    Patient ID: Bradley Fisher, male    DOB: 1966/05/03, 55 y.o.   MRN: [id number]  Chief Complaint  Patient presents with  . Abscess    Abscess This is a new problem. Episode onset: 3-4 days. The problem has been gradually worsening. Pertinent negatives include no abdominal pain, chills, fatigue, fever or nausea. Nothing aggravates the symptoms. He has tried nothing for the symptoms.   Patient is in today for I & D  Date/Time: 05/20/2021 8:38 AM Performed by: Ivy Lynn, NP Authorized by: Ivy Lynn, NP   Consent:    Consent obtained:  Verbal   Consent given by:  Patient   Risks discussed:  Bleeding, infection and pain Location:    Type:  Abscess   Location:  Pelvic Pre-procedure details:    Skin preparation:  Alcohol and Chloraprep Anesthesia (see MAR for exact dosages):    Anesthesia method:  None Procedure details:    Needle aspiration: no     Incision types:  Single straight   Incision depth:  Subcutaneous   Scalpel blade:  11   Wound management:  Extensive cleaning   Drainage:  Purulent and bloody   Drainage amount:  Moderate   Wound treatment:  Wound left open   Packing materials:  None Post-procedure details:    Patient tolerance of procedure:  Tolerated well, no immediate complications    Past Medical History:  Diagnosis Date  . Arthritis   . Asthma   . Depression   . GERD (gastroesophageal reflux disease)   . Hyperlipidemia   . Hypertension   . Seizures ([place])    unknow etiology; on meds for this; no seizures since on meds 10 years ago    Past Surgical History:  Procedure Laterality Date  . COLONOSCOPY WITH PROPOFOL N/A 05/29/2018   Procedure: COLONOSCOPY WITH PROPOFOL;  Surgeon: Daneil Dolin, MD;  Location: [facility];  Service: Endoscopy;  Laterality: N/A;  9:45am  . KNEE ARTHROSCOPY WITH MEDIAL MENISECTOMY Left 11/11/2016   Procedure: LEFT KNEE ARTHROSCOPY WITH MEDIAL MENISECTOMY;  Surgeon:  Carole Civil, MD;  Location: [facility] ORS;  Service: Orthopedics;  Laterality: Left;  . Thumb surgery-left  02/25/2020    Family History  Problem Relation Age of Onset  . Hypertension Mother   . Diabetes Mother   . Hypertension Father   . Diabetes Father   . Diabetes Brother   . Colon cancer Neg Hx     Social History   Socioeconomic History  . Marital status: Married    Spouse name: Not on file  . Number of children: 0  . Years of education: Not on file  . Highest education level: 7th grade  Occupational History  . Occupation: Disabled  Tobacco Use  . Smoking status: Current Some Day Smoker    Types: Cigars    Start date: 01/22/1973  . Smokeless tobacco: Current User    Types: Snuff  . Tobacco comment: cigars uses 2 per month  Vaping Use  . Vaping Use: Never used  Substance and Sexual Activity  . Alcohol use: Yes    Alcohol/week: 12.0 standard drinks    Types: 12 Cans of beer per week    Comment: drinks some weekends.   . Drug use: No  . Sexual activity: Yes    Birth control/protection: None  Other Topics Concern  . Not on file  Social History Narrative  . Not on file   Social  Determinants of Health   Financial Resource Strain: Not on file  Food Insecurity: Not on file  Transportation Needs: Not on file  Physical Activity: Not on file  Stress: Not on file  Social Connections: Not on file  Intimate Partner Violence: Not on file    Outpatient Medications Prior to Visit  Medication Sig Dispense Refill  . amLODipine (NORVASC) 10 MG tablet Take 1 tablet (10 mg total) by mouth daily. 90 tablet 3  . atorvastatin (LIPITOR) 10 MG tablet TAKE 1 TABLET DAILY 90 tablet 3  . celecoxib (CELEBREX) 200 MG capsule Take 200 mg by mouth daily.    . cetirizine (ZYRTEC) 10 MG tablet Take 1 tablet (10 mg total) by mouth daily. 90 tablet 3  . esomeprazole (NEXIUM) 40 MG capsule Take 1 capsule (40 mg total) by mouth daily. 90 capsule 3  . fluticasone (FLONASE) 50 MCG/ACT nasal  spray Place 2 sprays into both nostrils daily. 16 g 6  . gabapentin (NEURONTIN) 400 MG capsule Take 1 capsule (400 mg total) by mouth at bedtime. For PAIN (Patient taking differently: Take 400 mg by mouth 2 (two) times daily. For PAIN) 90 capsule 1  . HYDROcodone-acetaminophen (NORCO) 10-325 MG tablet Take 1 tablet by mouth 2 (two) times daily as needed for severe pain. 30 tablet 0  . lisinopril (ZESTRIL) 20 MG tablet Take 1 tablet (20 mg total) by mouth daily. 90 tablet 3  . meloxicam (MOBIC) 15 MG tablet Take 1 tablet (15 mg total) by mouth daily. 90 tablet 1  . methocarbamol (ROBAXIN) 750 MG tablet Take 750 mg by mouth 4 (four) times daily.    . naloxone (NARCAN) nasal spray 4 mg/0.1 mL Place 1 spray into the nose.    Marland Kitchen PROAIR HFA 108 (90 Base) MCG/ACT inhaler Inhale 2 puffs into the lungs every 6 (six) hours as needed for wheezing or shortness of breath. 18 g 2  . RESTASIS 0.05 % ophthalmic emulsion 1 drop 2 (two) times daily.    . sertraline (ZOLOFT) 100 MG tablet Take 1 tablet (100 mg total) by mouth daily. 90 tablet 3   No facility-administered medications prior to visit.    No Known Allergies  Review of Systems  Constitutional: Negative for chills, fatigue and fever.  HENT: Negative.   Respiratory: Negative.   Gastrointestinal: Negative for abdominal pain and nausea.  Skin: Positive for color change.       abscess  All other systems reviewed and are negative.      Objective:    Physical Exam Vitals and nursing note reviewed.  Constitutional:      Appearance: Normal appearance.  HENT:     Head: Normocephalic.     Mouth/Throat:     Mouth: Mucous membranes are moist.     Pharynx: Oropharynx is clear.  Eyes:     Conjunctiva/sclera: Conjunctivae normal.  Cardiovascular:     Rate and Rhythm: Normal rate and regular rhythm.     Pulses: Normal pulses.     Heart sounds: Normal heart sounds.  Pulmonary:     Effort: Pulmonary effort is normal.     Breath sounds: Normal  breath sounds.  Abdominal:     General: Bowel sounds are normal.  Skin:    Findings: Abscess and erythema present.          Comments: Abscess lower abdomen (public area)  Neurological:     Mental Status: He is alert.     BP 128/81   Pulse (!) 51  Temp 97.6 F (36.4 C) (Temporal)   Ht 6\' 1"  (1.854 m)   Wt 217 lb (98.4 kg)   SpO2 96%   BMI 28.63 kg/m  Wt Readings from Last 3 Encounters:  05/20/21 217 lb (98.4 kg)  01/22/21 222 lb 3.2 oz (100.8 kg)  06/18/20 226 lb 3.2 oz (102.6 kg)    Health Maintenance Due  Topic Date Due  . Pneumococcal Vaccine 81-91 Years old (1 of 4 - PCV13) Never done  . HIV Screening  Never done    There are no preventive care reminders to display for this patient.   Lab Results  Component Value Date   TSH 1.360 01/10/2019   Lab Results  Component Value Date   WBC 7.2 01/22/2021   HGB 14.4 01/22/2021   HCT 40.7 01/22/2021   MCV 85 01/22/2021   PLT 237 01/22/2021   Lab Results  Component Value Date   NA 138 01/22/2021   K 4.2 01/22/2021   CO2 22 01/22/2021   GLUCOSE 85 01/22/2021   BUN 14 01/22/2021   CREATININE 0.73 (L) 01/22/2021   BILITOT 0.3 01/22/2021   ALKPHOS 116 01/22/2021   AST 22 01/22/2021   ALT 16 01/22/2021   PROT 7.1 01/22/2021   ALBUMIN 4.7 01/22/2021   CALCIUM 9.6 01/22/2021   ANIONGAP 9 11/08/2016   Lab Results  Component Value Date   CHOL 167 01/22/2021   Lab Results  Component Value Date   HDL 40 01/22/2021   Lab Results  Component Value Date   LDLCALC 90 01/22/2021   Lab Results  Component Value Date   TRIG 216 (H) 01/22/2021   Lab Results  Component Value Date   CHOLHDL 4.2 01/22/2021   No results found for: HGBA1C     Assessment & Plan:   Problem List Items Addressed This Visit      Other   Abscess - Primary    Symptoms not well managed, keflex 500 mg tablet by mouth twice daily, abscess I&D, cleaned, and dressed, patient tolerated well, education provided to watch for s/s of  infection. Follow up with worsening or unresolved symptoms, patient verbalize understanding.      Relevant Medications   cephALEXin (KEFLEX) 500 MG capsule       Meds ordered this encounter  Medications  . cephALEXin (KEFLEX) 500 MG capsule    Sig: Take 1 capsule (500 mg total) by mouth 2 (two) times daily.    Dispense:  14 capsule    Refill:  0    Order Specific Question:   Supervising Provider    Answer:   Janora Norlander [[id number]]     Ivy Lynn, NP

## 2021-10-02 ENCOUNTER — Telehealth: Payer: Self-pay | Admitting: Family Medicine

## 2021-10-02 NOTE — Telephone Encounter (Signed)
Let message for patient to call me back at [phone] in regards to upcoming AWV appt on 10/06/21. We need to reschedule this appt there has been a change in the [facility] schedule for that day.  If patient calls the office back please reschedule AWV from 10/06/21 to another open time on the [facility] schedule.

## 2021-10-06 ENCOUNTER — Ambulatory Visit: Payer: 59

## 2021-10-14 ENCOUNTER — Ambulatory Visit: Payer: 59

## 2022-01-26 ENCOUNTER — Encounter: Payer: 59 | Admitting: Family Medicine

## 2022-02-05 ENCOUNTER — Telehealth: Payer: Self-pay | Admitting: Pulmonary Disease

## 2022-02-05 NOTE — Telephone Encounter (Signed)
Patient spouse is patient of Dr. Vaughan Browner who stated patient is wanting to schedule a pulmonary consult to have lungs/breathing evaluated.  I called and left message on voice mail for patient to call office to schedule pulmonary consult with Dr. Vaughan Browner, if he is interested.

## 2022-02-16 NOTE — Telephone Encounter (Signed)
Patient scheduled 03/03/22 with Dr. Vaughan Browner for pulmonary consult.  ?

## 2022-03-03 ENCOUNTER — Ambulatory Visit (INDEPENDENT_AMBULATORY_CARE_PROVIDER_SITE_OTHER): Payer: 59 | Admitting: Pulmonary Disease

## 2022-03-03 ENCOUNTER — Encounter: Payer: Self-pay | Admitting: Pulmonary Disease

## 2022-03-03 ENCOUNTER — Other Ambulatory Visit: Payer: Self-pay

## 2022-03-03 VITALS — BP 144/80 | HR 62 | Temp 98.0°F | Ht 73.0 in | Wt 200.4 lb

## 2022-03-03 DIAGNOSIS — R0602 Shortness of breath: Secondary | ICD-10-CM

## 2022-03-03 NOTE — Progress Notes (Signed)
? ?      ?[facility]    [id number]    1966-01-07 ? ?Primary Care Physician:No primary care provider on file. ? ?Referring Physician: No referring provider defined for this encounter. ? ?Chief complaint: Consult for dyspnea, hypoxia ? ?HPI: ?56 year old with history of asthma, hyperlipidemia, hypertension ?Complains of mild dyspnea on exertion for the past few years.  At his primary care he was noted to have low oxygen levels and got referred here for evaluation ? ?Denies any cough, sputum production, congestion, fevers, chills ? ?Pets: No pets ?Occupation: On disability ?Exposures: Has been exposure to mold from the basement in 2022.  He lived in this house for a year and moved out in December 2022.  No ongoing exposures.  No hot tub, Jacuzzi or feather pillows or comforters ?Smoking history: Occasional smoker ?Travel history: No significant travel history ?Relevant family history: No family history of lung disease ? ? ?Outpatient Encounter Medications as of 03/03/2022  ?Medication Sig  ? amLODipine (NORVASC) 10 MG tablet Take 1 tablet (10 mg total) by mouth daily.  ? atorvastatin (LIPITOR) 10 MG tablet TAKE 1 TABLET DAILY  ? celecoxib (CELEBREX) 200 MG capsule Take 200 mg by mouth daily.  ? cetirizine (ZYRTEC) 10 MG tablet Take 1 tablet (10 mg total) by mouth daily.  ? esomeprazole (NEXIUM) 40 MG capsule Take 1 capsule (40 mg total) by mouth daily.  ? fluticasone (FLONASE) 50 MCG/ACT nasal spray Place 2 sprays into both nostrils daily.  ? gabapentin (NEURONTIN) 400 MG capsule Take 1 capsule (400 mg total) by mouth at bedtime. For PAIN (Patient taking differently: Take 400 mg by mouth 2 (two) times daily. For PAIN)  ? HYDROcodone-acetaminophen (NORCO) 10-325 MG tablet Take 1 tablet by mouth 2 (two) times daily as needed for severe pain.  ? lisinopril (ZESTRIL) 20 MG tablet Take 1 tablet (20 mg total) by mouth daily.  ? meloxicam (MOBIC) 15 MG tablet Take 1 tablet (15 mg total) by mouth daily.  ?  methocarbamol (ROBAXIN) 750 MG tablet Take 750 mg by mouth 4 (four) times daily.  ? PROAIR HFA 108 (90 Base) MCG/ACT inhaler Inhale 2 puffs into the lungs every 6 (six) hours as needed for wheezing or shortness of breath.  ? sertraline (ZOLOFT) 100 MG tablet Take 1 tablet (100 mg total) by mouth daily.  ? naloxone (NARCAN) nasal spray 4 mg/0.1 mL Place 1 spray into the nose. (Patient not taking: Reported on 03/03/2022)  ? RESTASIS 0.05 % ophthalmic emulsion 1 drop 2 (two) times daily. (Patient not taking: Reported on 03/03/2022)  ? [DISCONTINUED] cephALEXin (KEFLEX) 500 MG capsule Take 1 capsule (500 mg total) by mouth 2 (two) times daily.  ? ?No facility-administered encounter medications on file as of 03/03/2022.  ? ? ?Allergies as of 03/03/2022  ? (No Known Allergies)  ? ? ?Past Medical History:  ?Diagnosis Date  ? Arthritis   ? Asthma   ? Depression   ? GERD (gastroesophageal reflux disease)   ? Hyperlipidemia   ? Hypertension   ? Seizures ([place])   ? unknow etiology; on meds for this; no seizures since on meds 10 years ago  ? ? ?Past Surgical History:  ?Procedure Laterality Date  ? COLONOSCOPY WITH PROPOFOL N/A 05/29/2018  ? Procedure: COLONOSCOPY WITH PROPOFOL;  Surgeon: Daneil Dolin, MD;  [facility];  Service: Endoscopy;  Laterality: N/A;  9:45am  ? KNEE ARTHROSCOPY WITH MEDIAL MENISECTOMY Left 11/11/2016  ? Procedure: LEFT KNEE ARTHROSCOPY WITH  MEDIAL MENISECTOMY;  Surgeon: Carole Civil, MD;  Location: [facility];  Service: Orthopedics;  Laterality: Left;  ? Thumb surgery-left  02/25/2020  ? ? ?Family History  ?Problem Relation Age of Onset  ? Hypertension Mother   ? Diabetes Mother   ? Hypertension Father   ? Diabetes Father   ? Diabetes Brother   ? Colon cancer Neg Hx   ? ? ?Social History  ? ?Socioeconomic History  ? Marital status: Married  ?  Spouse name: Not on file  ? Number of children: 0  ? Years of education: Not on file  ? Highest education level: 7th grade  ?Occupational History  ?  Occupation: Disabled  ?Tobacco Use  ? Smoking status: Some Days  ?  Types: Cigars  ?  Start date: 01/22/1973  ?  Passive exposure: Past  ? Smokeless tobacco: Current  ?  Types: Snuff  ? Tobacco comments:  ?  Smokes 1 time a month/03/03/22  ?Vaping Use  ? Vaping Use: Never used  ?Substance and Sexual Activity  ? Alcohol use: Yes  ?  Alcohol/week: 12.0 standard drinks  ?  Types: 12 Cans of beer per week  ?  Comment: drinks some weekends.   ? Drug use: No  ? Sexual activity: Yes  ?  Birth control/protection: None  ?Other Topics Concern  ? Not on file  ?Social History Narrative  ? Not on file  ? ?Social Determinants of Health  ? ?Financial Resource Strain: Not on file  ?Food Insecurity: Not on file  ?Transportation Needs: Not on file  ?Physical Activity: Not on file  ?Stress: Not on file  ?Social Connections: Not on file  ?Intimate Partner Violence: Not on file  ? ? ?Review of systems: ?Review of Systems  ?Constitutional: Negative for fever and chills.  ?HENT: Negative.   ?Eyes: Negative for blurred vision.  ?Respiratory: as per HPI  ?Cardiovascular: Negative for chest pain and palpitations.  ?Gastrointestinal: Negative for vomiting, diarrhea, blood per rectum. ?Genitourinary: Negative for dysuria, urgency, frequency and hematuria.  ?Musculoskeletal: Negative for myalgias, back pain and joint pain.  ?Skin: Negative for itching and rash.  ?Neurological: Negative for dizziness, tremors, focal weakness, seizures and loss of consciousness.  ?Endo/Heme/Allergies: Negative for environmental allergies.  ?Psychiatric/Behavioral: Negative for depression, suicidal ideas and hallucinations.  ?All other systems reviewed and are negative. ? ?Physical Exam: ?Blood pressure (!) 144/80, pulse 62, temperature 98 ?F (36.7 ?C), temperature source Oral, height '6\' 1"'$  (1.854 m), weight 200 lb 6.4 oz (90.9 kg), SpO2 99 %. ?Gen:      No acute distress ?HEENT:  EOMI, sclera anicteric ?Neck:     No masses; no thyromegaly ?Lungs:    Clear to  auscultation bilaterally; normal respiratory effort ?CV:         Regular rate and rhythm; no murmurs ?Abd:      + bowel sounds; soft, non-tender; no palpable masses, no distension ?Ext:    No edema; adequate peripheral perfusion ?Skin:      Warm and dry; no rash ?Neuro: alert and oriented x 3 ?Psych: normal mood and affect ? ?Data Reviewed: ?Imaging: ? ?PFTs: ? ?Labs: ? ?Assessment:  ?Evaluation for dyspnea ?Etiology is unclear.  He has very minimal smoking history and smokes only occasionally.  He does have significant exposure to mold last year from the basement and will need evaluation for hypersensitivity pneumonitis ?He did not desat on exertion ? ?He does have history of asthma but it appears mild and is on  albuterol inhaler ? ?We will schedule high-res CT and PFTs for further evaluation. ? ?Plan/Recommendations: ?High-res CT, PFTs ? ?Marshell Garfinkel MD ?[facility] ?03/03/2022, 10:25 AM ? ?CC: No ref. provider found ? ?Thank you ?

## 2022-03-03 NOTE — Patient Instructions (Signed)
Your oxygen levels did not drop today which is good news ?We will schedule a high-resolution chest CT for evaluation of your lungs given mold exposure ?We will schedule PFTs ?Follow-up in clinic in 3 months ?

## 2022-04-12 ENCOUNTER — Ambulatory Visit (HOSPITAL_COMMUNITY): Payer: 59

## 2022-05-15 ENCOUNTER — Other Ambulatory Visit: Payer: Self-pay | Admitting: Family Medicine

## 2022-05-15 DIAGNOSIS — R0600 Dyspnea, unspecified: Secondary | ICD-10-CM

## 2022-05-27 ENCOUNTER — Telehealth: Payer: Self-pay | Admitting: Pulmonary Disease

## 2022-05-27 NOTE — Telephone Encounter (Signed)
Collette it looks like you had pended it and tried to call them to schedule the CT according to the notes.

## 2022-05-31 NOTE — Telephone Encounter (Signed)
05/27/22-Appt cancel due to no show pt's spouse called to reschedule ct-scan provided scheduling phone# to schedule. per pt's spouse she will look at her calender and call central scheduling cr

## 2022-06-03 ENCOUNTER — Ambulatory Visit: Payer: 59 | Admitting: Pulmonary Disease

## 2022-06-03 ENCOUNTER — Telehealth: Payer: Self-pay | Admitting: *Deleted

## 2022-06-09 NOTE — Telephone Encounter (Signed)
Seems like encounter was open in error so closing encounter.  

## 2022-06-28 ENCOUNTER — Ambulatory Visit (HOSPITAL_COMMUNITY): Admission: RE | Admit: 2022-06-28 | Payer: 59 | Source: Ambulatory Visit

## 2023-04-26 ENCOUNTER — Encounter: Payer: Self-pay | Admitting: *Deleted

## 2023-05-04 ENCOUNTER — Other Ambulatory Visit: Payer: Self-pay

## 2023-05-04 ENCOUNTER — Ambulatory Visit: Payer: 59 | Attending: Physical Medicine and Rehabilitation | Admitting: Physical Therapy

## 2023-05-04 DIAGNOSIS — M5459 Other low back pain: Secondary | ICD-10-CM | POA: Insufficient documentation

## 2023-05-04 DIAGNOSIS — R293 Abnormal posture: Secondary | ICD-10-CM | POA: Insufficient documentation

## 2023-05-04 NOTE — Therapy (Signed)
OUTPATIENT PHYSICAL THERAPY THORACOLUMBAR EVALUATION   Patient Name: TEMITOPE FEKETE MRN: [id number] DOB:Jul 15, 1966, 57 y.o., male Today's Date: 05/04/2023  END OF SESSION:  PT End of Session - 05/04/23 1120     Visit Number 1    Number of Visits 8    Date for PT Re-Evaluation 06/01/23    PT Start Time 0947    PT Stop Time 1028    PT Time Calculation (min) 41 min    Activity Tolerance Patient tolerated treatment well    Behavior During Therapy [facility] for tasks assessed/performed             Past Medical History:  Diagnosis Date   Arthritis    Asthma    Depression    GERD (gastroesophageal reflux disease)    Hyperlipidemia    Hypertension    Seizures (HCC)    unknow etiology; on meds for this; no seizures since on meds 10 years ago   Past Surgical History:  Procedure Laterality Date   COLONOSCOPY WITH PROPOFOL N/A 05/29/2018   Procedure: COLONOSCOPY WITH PROPOFOL;  Surgeon: Corbin Ade, MD;  [facility];  Service: Endoscopy;  Laterality: N/A;  9:45am   KNEE ARTHROSCOPY WITH MEDIAL MENISECTOMY Left 11/11/2016   Procedure: LEFT KNEE ARTHROSCOPY WITH MEDIAL MENISECTOMY;  Surgeon: Vickki Hearing, MD;  Location: [facility];  Service: Orthopedics;  Laterality: Left;   Thumb surgery-left  02/25/2020   Patient Active Problem List   Diagnosis Date Noted   Abscess 05/20/2021   Tobacco use 01/22/2021   Gastroesophageal reflux disease without esophagitis 08/17/2017   Chronic seasonal allergic rhinitis due to pollen 03/09/2017   Depression, recurrent (HCC) 02/11/2017   Generalized OA 10/27/2016   Chronic midline low back pain without sciatica 10/27/2016   Hypertension, essential 10/27/2016   History of seizures 10/27/2016    REFERRING PROVIDER: Romero Belling  REFERRING DIAG: Left rib pain and chronic LT L5 radiculopathy and scoliosis.  Rationale for Evaluation and Treatment: Rehabilitation  THERAPY DIAG:  Other low back pain - Plan: PT plan of care  cert/re-cert  Abnormal posture - Plan: PT plan of care cert/re-cert  ONSET DATE: Ongoing.  SUBJECTIVE:                                                                                                                                                                                           SUBJECTIVE STATEMENT: The patient presents to the clinic with c/o chronic low back pain.  He states he had back surgery in 2020.  He reports severe today rated at 9-10/10.  Sleep decreases his pain.  Increased activity and transitioning  from sit to stand increases his pain.  He describes his pain as sharp.    PERTINENT HISTORY:  Lumbar surgery, HTN, left knee surgery.  PAIN:  Are you having pain? Yes: NPRS scale: 9-10/10 Pain location: Lumbar. Pain description: Sharp. Aggravating factors: Moving and transitory movements. Relieving factors: Sleep.  PRECAUTIONS: None  WEIGHT BEARING RESTRICTIONS: No  FALLS:  Has patient fallen in last 6 months? Yes. Number of falls 1.  LIVING ENVIRONMENT: Lives with: lives with their spouse Lives in: House/apartment Has following equipment at home: None  OCCUPATION: Not working.  PLOF: Independent with basic ADLs  PATIENT GOALS: Decrease pain.    OBJECTIVE:   POSTURE: rounded shoulders, forward head, and decreased lumbar lordosis, posterior pelvic tilt.    PALPATION: C/o diffuse bilateral lower lumbar region.  Incisional sites observed.  Potential lipoma on right.  LUMBAR ROM:   Active lumbar flexion and extension is WFL.  LOWER EXTREMITY ROM:     Normal bilateral LE range of motion.  LOWER EXTREMITY MMT:    Normal LE strength.  LUMBAR SPECIAL TESTS:  Equal leg lengths. No significant pain complaints with SLR and FABER testing.  Normal bilateral Patellar DTR's.  Not able to elicit Achilles reflexes.   GAIT: WNL. TODAY'S TREATMENT:                                                                                                                               DATE: HMP and IFC at 80-150 Hz on 40% scan x 15 minutes to patient's lumbar region.  Patient tolerated treatment without complaint with normal modality response following removal of modality.  ASSESSMENT:  CLINICAL IMPRESSION: The patient presents to OPPT with c/o chronic low back pain and a PMH for surgery in 2020.  He reports severe pain with c/o diffuse lumbar pain.  He has normal LE strength.  Special testing was negative.  Transitory movements are very painful.  He has a loss of lumbar lordosis.  Patient will benefit from skilled physical therapy intervention to address pain and deficits.  OBJECTIVE IMPAIRMENTS: decreased activity tolerance, increased muscle spasms, postural dysfunction, and pain.   ACTIVITY LIMITATIONS: carrying, lifting, bending, transfers, and bed mobility  PARTICIPATION LIMITATIONS: cleaning and yard work  PERSONAL FACTORS: Time since onset of injury/illness/exacerbation and 1 comorbidity: prior lumbar surgery  are also affecting patient's functional outcome.   REHAB POTENTIAL: Good  CLINICAL DECISION MAKING: Stable/uncomplicated  EVALUATION COMPLEXITY: Low   GOALS:  LONG TERM GOALS: Target date: 06/01/23  Ind with an HEP.  Goal status: INITIAL  2.  Perform transitory movements with pain not > 4/10.  Goal status: INITIAL  3.  Perform ADL's with pain not > 4/10.  Goal status: INITIAL  PLAN:  PT FREQUENCY: 2x/week  PT DURATION: 4 weeks  PLANNED INTERVENTIONS: Therapeutic exercises, Therapeutic activity, Patient/Family education, Self Care, Dry Needling, Cryotherapy, Moist heat, Ultrasound, and Manual therapy.  PLAN FOR NEXT SESSION: Modalities and STW/M as needed  and core exercise progression.   Aleem Elza, [place], PT 05/04/2023, 12:19 PM

## 2023-05-11 ENCOUNTER — Encounter: Payer: 59 | Admitting: *Deleted

## 2023-06-14 ENCOUNTER — Other Ambulatory Visit: Payer: Self-pay | Admitting: Physical Medicine and Rehabilitation

## 2023-06-14 DIAGNOSIS — Z9889 Other specified postprocedural states: Secondary | ICD-10-CM

## 2023-08-28 ENCOUNTER — Ambulatory Visit
Admission: RE | Admit: 2023-08-28 | Discharge: 2023-08-28 | Disposition: A | Discharge status: Home / Self Care | Payer: 59 | Source: Ambulatory Visit | Attending: Physical Medicine and Rehabilitation | Admitting: Physical Medicine and Rehabilitation

## 2023-08-28 DIAGNOSIS — Z9889 Other specified postprocedural states: Secondary | ICD-10-CM

## 2023-08-28 MED ORDER — GADOPICLENOL 0.5 MMOL/ML IV SOLN
9.0000 mL | Freq: Once | INTRAVENOUS | Status: AC | PRN
  Administered 2023-08-28: 9 mL via INTRAVENOUS

## 2023-09-30 ENCOUNTER — Other Ambulatory Visit (HOSPITAL_BASED_OUTPATIENT_CLINIC_OR_DEPARTMENT_OTHER): Payer: Self-pay

## 2023-09-30 DIAGNOSIS — R0683 Snoring: Secondary | ICD-10-CM

## 2023-10-12 ENCOUNTER — Encounter (HOSPITAL_BASED_OUTPATIENT_CLINIC_OR_DEPARTMENT_OTHER): Payer: 59 | Admitting: Internal Medicine

## 2023-12-19 ENCOUNTER — Other Ambulatory Visit: Payer: Self-pay | Admitting: Physician Assistant

## 2023-12-19 DIAGNOSIS — Z122 Encounter for screening for malignant neoplasm of respiratory organs: Secondary | ICD-10-CM

## 2023-12-22 ENCOUNTER — Ambulatory Visit: Payer: 59 | Admitting: Physical Therapy

## 2023-12-26 ENCOUNTER — Ambulatory Visit: Payer: 59 | Attending: Neurosurgery

## 2023-12-26 ENCOUNTER — Other Ambulatory Visit: Payer: Self-pay

## 2023-12-26 DIAGNOSIS — R293 Abnormal posture: Secondary | ICD-10-CM | POA: Insufficient documentation

## 2023-12-26 DIAGNOSIS — M5459 Other low back pain: Secondary | ICD-10-CM | POA: Diagnosis present

## 2023-12-26 NOTE — Therapy (Signed)
 OUTPATIENT PHYSICAL THERAPY THORACOLUMBAR EVALUATION   Patient Name: Bradley Fisher MRN: [id number] DOB:[birth year], 58 y.o., male Today's Date: 12/26/2023  END OF SESSION:  PT End of Session - 12/26/23 0930     Visit Number 1    Number of Visits 6    Date for PT Re-Evaluation 02/10/24    PT Start Time 0931    PT Stop Time 0953    PT Time Calculation (min) 22 min    Activity Tolerance Patient limited by pain    Behavior During Therapy [facility] for tasks assessed/performed             Past Medical History:  Diagnosis Date   Arthritis    Asthma    Depression    GERD (gastroesophageal reflux disease)    Hyperlipidemia    Hypertension    Seizures (HCC)    unknow etiology; on meds for this; no seizures since on meds 10 years ago   Past Surgical History:  Procedure Laterality Date   COLONOSCOPY WITH PROPOFOL  N/A 05/29/2018   Procedure: COLONOSCOPY WITH PROPOFOL ;  Surgeon: Shaaron Lamar HERO, MD;  [facility];  Service: Endoscopy;  Laterality: N/A;  9:45am   KNEE ARTHROSCOPY WITH MEDIAL MENISECTOMY Left 11/11/2016   Procedure: LEFT KNEE ARTHROSCOPY WITH MEDIAL MENISECTOMY;  Surgeon: Taft FORBES Minerva, MD;  Location: [facility];  Service: Orthopedics;  Laterality: Left;   Thumb surgery-left  02/25/2020   Patient Active Problem List   Diagnosis Date Noted   Abscess 05/20/2021   Tobacco use 01/22/2021   Gastroesophageal reflux disease without esophagitis 08/17/2017   Chronic seasonal allergic rhinitis due to pollen 03/09/2017   Depression, recurrent (HCC) 02/11/2017   Generalized OA 10/27/2016   Chronic midline low back pain without sciatica 10/27/2016   Hypertension, essential 10/27/2016   History of seizures 10/27/2016   REFERRING PROVIDER: Debby Dorn MATSU, MD   REFERRING DIAG: Other forms of scoliosis, thoracolumbar region   Rationale for Evaluation and Treatment: Rehabilitation  THERAPY DIAG:  Other low back pain  ONSET DATE: a long  time  SUBJECTIVE:                                                                                                                                                                                           SUBJECTIVE STATEMENT: Patient reports that his back has been bothering him a long time and it has been getting worse. He feels that therapy is not going to help as his back is curved like a snake. He notes that his pain is so bad that he can hardly do anything. He is scheduled to get injections  in his back on 01/05/24. He is also having pain that will run all the way down his left leg when lays down to go to bed at night.   PERTINENT HISTORY:  Hypertension, allergies, osteoarthritis, history of seizures, current smoker, and asthma  PAIN:  Are you having pain? Yes: NPRS scale: 9-10/10 Pain location: back Pain description: constant sharp pain Aggravating factors: transfers, walking, and sitting Relieving factors: nothing helps  PRECAUTIONS: None  RED FLAGS: None   WEIGHT BEARING RESTRICTIONS: No  FALLS:  Has patient fallen in last 6 months? No  LIVING ENVIRONMENT: Lives with: lives with their family Lives in: House/apartment Stairs: Yes: External: 5 steps; can reach both Has following equipment at home: None  OCCUPATION: retired  PLOF: Independent  PATIENT GOALS: reduced pain  NEXT MD VISIT: 01/16/24  OBJECTIVE:  Note: Objective measures were completed at Evaluation unless otherwise noted.  DIAGNOSTIC FINDINGS: 08/28/23 lumbar MRI IMPRESSION: 1. Advanced multilevel degenerative changes of the lumbar spine, as described above. Findings have progressed compared to 2007. 2. Moderate spinal canal narrowing at L1-L2, L3-L4, and L4-L5. 3. Multilevel neural foraminal narrowing, severe on the right at L3-L4 and on the left at L4-L5 and L5-S1.  COGNITION: Overall cognitive status: Within functional limits for tasks assessed     SENSATION: Patient reports no numbness or  tingling  PALPATION: TTP: L4-5 spinous process (familiar pain) Increased tone noted with palpation along bilateral lumbar paraspinals  LUMBAR ROM:   AROM eval  Flexion 48; familiar pain   Extension 10; familiar pain  Right lateral flexion   Left lateral flexion   Right rotation 25% limited; painful  Left rotation 25% limited; painful (L rotation more painful that R rotation)    (Blank rows = not tested)  LOWER EXTREMITY ROM: WFL for activities assessed  LOWER EXTREMITY MMT:    MMT Right eval Left eval  Hip flexion 3+/5 3+/5  Hip extension    Hip abduction    Hip adduction    Hip internal rotation    Hip external rotation    Knee flexion 4+/5 4/5  Knee extension 4-/5 4/5  Ankle dorsiflexion 4/5 4-/5  Ankle plantarflexion    Ankle inversion    Ankle eversion     (Blank rows = not tested)  GAIT: Assistive device utilized: None Level of assistance: Complete Independence  TREATMENT DATE:                                                                                                                                  PATIENT EDUCATION:  Education details: plan of care, prognosis, and goals for physical therapy Person educated: Patient Education method: Explanation Education comprehension: verbalized understanding  HOME EXERCISE PROGRAM:   ASSESSMENT:  CLINICAL IMPRESSION: Patient is a 58 y.o. male who was seen today for physical therapy evaluation and treatment for chronic low back pain secondary to scoliosis. He presented with high pain severity and irritability with  lumbar active range of motion being the most aggravating to his familiar symptoms. Recommend that he continue with skilled physical therapy to address his impairments to maximize his functional mobility.    OBJECTIVE IMPAIRMENTS: decreased activity tolerance, difficulty walking, decreased ROM, decreased strength, hypomobility, impaired flexibility, impaired tone, and pain.   ACTIVITY LIMITATIONS:  lifting, bending, sitting, standing, sleeping, transfers, and locomotion level  PARTICIPATION LIMITATIONS: meal prep, cleaning, laundry, shopping, and community activity  PERSONAL FACTORS: Behavior pattern, Past/current experiences, Time since onset of injury/illness/exacerbation, and 3+ comorbidities: Hypertension, allergies, osteoarthritis, history of seizures, current smoker, and asthma  are also affecting patient's functional outcome.   REHAB POTENTIAL: Fair    CLINICAL DECISION MAKING: Evolving/moderate complexity  EVALUATION COMPLEXITY: Moderate   GOALS: Goals reviewed with patient? Yes  LONG TERM GOALS: Target date: 01/16/24  Patient will be independent with his HEP.  Baseline:  Goal status: INITIAL  2.  Patient will be able to complete his daily activities without his familiar pain exceeding 7/10. Baseline:  Goal status: INITIAL  3.  Patient will be able to demonstrate at least 4-/5 bilateral hip flexor strength for improved functional mobility.  Baseline:  Goal status: INITIAL  4.  Patient will be able to transfer from sitting to standing with minimal to no upper extremity support for improved independence.  Baseline: moderate upper extremity support required for this transfer  Goal status: INITIAL  PLAN:  PT FREQUENCY: 2x/week  PT DURATION: 3 weeks  PLANNED INTERVENTIONS: 97164- PT Re-evaluation, 97110-Therapeutic exercises, 97530- Therapeutic activity, 97112- Neuromuscular re-education, 97535- Self Care, [id number]- Manual therapy, [id number]- Gait training, 97014- Electrical stimulation (unattended), 97035- Ultrasound, Patient/Family education, Stair training, Dry Needling, Joint mobilization, Spinal mobilization, Cryotherapy, and Moist heat.  PLAN FOR NEXT SESSION: Nustep, lifting mechanics, lower extremity strengthening, and modalities as needed   Lacinda JAYSON Fass, PT 12/26/2023, 11:29 AM

## 2023-12-28 ENCOUNTER — Ambulatory Visit: Payer: 59

## 2023-12-28 DIAGNOSIS — M5459 Other low back pain: Secondary | ICD-10-CM

## 2023-12-28 NOTE — Therapy (Signed)
 OUTPATIENT PHYSICAL THERAPY THORACOLUMBAR TREATMENT   Patient Name: Bradley Fisher MRN: [id number] DOB:03-29-1966, 58 y.o., male Today's Date: 12/28/2023  END OF SESSION:  PT End of Session - 12/28/23 0920     Visit Number 2    Number of Visits 6    Date for PT Re-Evaluation 02/10/24    PT Start Time 0914    PT Stop Time 1002    PT Time Calculation (min) 48 min    Activity Tolerance Patient limited by pain    Behavior During Therapy [facility] for tasks assessed/performed              Past Medical History:  Diagnosis Date   Arthritis    Asthma    Depression    GERD (gastroesophageal reflux disease)    Hyperlipidemia    Hypertension    Seizures (HCC)    unknow etiology; on meds for this; no seizures since on meds 10 years ago   Past Surgical History:  Procedure Laterality Date   COLONOSCOPY WITH PROPOFOL  N/A 05/29/2018   Procedure: COLONOSCOPY WITH PROPOFOL ;  Surgeon: Suzette Espy, MD;  [facility];  Service: Endoscopy;  Laterality: N/A;  9:45am   KNEE ARTHROSCOPY WITH MEDIAL MENISECTOMY Left 11/11/2016   Procedure: LEFT KNEE ARTHROSCOPY WITH MEDIAL MENISECTOMY;  Surgeon: Darrin Emerald, MD;  Location: [facility];  Service: Orthopedics;  Laterality: Left;   Thumb surgery-left  02/25/2020   Patient Active Problem List   Diagnosis Date Noted   Abscess 05/20/2021   Tobacco use 01/22/2021   Gastroesophageal reflux disease without esophagitis 08/17/2017   Chronic seasonal allergic rhinitis due to pollen 03/09/2017   Depression, recurrent (HCC) 02/11/2017   Generalized OA 10/27/2016   Chronic midline low back pain without sciatica 10/27/2016   Hypertension, essential 10/27/2016   History of seizures 10/27/2016   REFERRING PROVIDER: Van Gelinas, MD   REFERRING DIAG: Other forms of scoliosis, thoracolumbar region   Rationale for Evaluation and Treatment: Rehabilitation  THERAPY DIAG:  Other low back pain  ONSET DATE: "a long  time"  SUBJECTIVE:                                                                                                                                                                                           SUBJECTIVE STATEMENT: Patient reports that he is hurting, but it's going to be hurting anyway.   PERTINENT HISTORY:  Hypertension, allergies, osteoarthritis, history of seizures, current smoker, and asthma  PAIN:  Are you having pain? Yes: NPRS scale: 8/10 Pain location: back Pain description: constant sharp pain Aggravating factors: transfers, walking, and sitting Relieving  factors: "nothing helps"  PRECAUTIONS: None  RED FLAGS: None   WEIGHT BEARING RESTRICTIONS: No  FALLS:  Has patient fallen in last 6 months? No  LIVING ENVIRONMENT: Lives with: lives with their family Lives in: House/apartment Stairs: Yes: External: 5 steps; can reach both Has following equipment at home: None  OCCUPATION: retired  PLOF: Independent  PATIENT GOALS: reduced pain  NEXT MD VISIT: 01/16/24  OBJECTIVE:  Note: Objective measures were completed at Evaluation unless otherwise noted.  DIAGNOSTIC FINDINGS: 08/28/23 lumbar MRI IMPRESSION: 1. Advanced multilevel degenerative changes of the lumbar spine, as described above. Findings have progressed compared to 2007. 2. Moderate spinal canal narrowing at L1-L2, L3-L4, and L4-L5. 3. Multilevel neural foraminal narrowing, severe on the right at L3-L4 and on the left at L4-L5 and L5-S1.  COGNITION: Overall cognitive status: Within functional limits for tasks assessed     SENSATION: Patient reports no numbness or tingling  PALPATION: TTP: L4-5 spinous process (familiar pain) Increased tone noted with palpation along bilateral lumbar paraspinals  LUMBAR ROM:   AROM eval  Flexion 48; familiar pain   Extension 10; familiar pain  Right lateral flexion   Left lateral flexion   Right rotation 25% limited; painful  Left rotation 25%  limited; painful (L rotation more painful that R rotation)    (Blank rows = not tested)  LOWER EXTREMITY ROM: WFL for activities assessed  LOWER EXTREMITY MMT:    MMT Right eval Left eval  Hip flexion 3+/5 3+/5  Hip extension    Hip abduction    Hip adduction    Hip internal rotation    Hip external rotation    Knee flexion 4+/5 4/5  Knee extension 4-/5 4/5  Ankle dorsiflexion 4/5 4-/5  Ankle plantarflexion    Ankle inversion    Ankle eversion     (Blank rows = not tested)  GAIT: Assistive device utilized: None Level of assistance: Complete Independence  TREATMENT DATE:                                                                                                                                                                 12/28/23 EXERCISE LOG  Exercise Repetitions and Resistance Comments  Nustep  L3 x 12 minutes   Rocker board  5 minutes    Ball roll out  3 minutes  Flexion           Blank cell = exercise not performed today  Modalities: no redness or adverse reaction to today's modalities  Date:  Unattended Estim: Lumbar, IFC @ 80-150 Hz w/ 40% scan, 15 mins, Pain and Tone  PATIENT EDUCATION:  Education details: plan of care, prognosis, and goals for physical therapy Person educated: Patient Education method: Explanation Education comprehension: verbalized understanding  HOME  EXERCISE PROGRAM:   ASSESSMENT:  CLINICAL IMPRESSION: Patient was introduced to multiple new interventions for improved lumbar mobility and reduced pain. He required moderate cueing with ball roll outs for proper exercise performance to facilitate lumbar mobility. Electrical stimulation to his lumbar paraspinals were the most effective at reducing his familiar symptoms. He reported feeling alright upon the conclusion of treatment. He continues to require skilled physical therapy to address his remaining impairments to maximize his functional mobility.   OBJECTIVE IMPAIRMENTS:  decreased activity tolerance, difficulty walking, decreased ROM, decreased strength, hypomobility, impaired flexibility, impaired tone, and pain.   ACTIVITY LIMITATIONS: lifting, bending, sitting, standing, sleeping, transfers, and locomotion level  PARTICIPATION LIMITATIONS: meal prep, cleaning, laundry, shopping, and community activity  PERSONAL FACTORS: Behavior pattern, Past/current experiences, Time since onset of injury/illness/exacerbation, and 3+ comorbidities: Hypertension, allergies, osteoarthritis, history of seizures, current smoker, and asthma  are also affecting patient's functional outcome.   REHAB POTENTIAL: Fair    CLINICAL DECISION MAKING: Evolving/moderate complexity  EVALUATION COMPLEXITY: Moderate   GOALS: Goals reviewed with patient? Yes  LONG TERM GOALS: Target date: 01/16/24  Patient will be independent with his HEP.  Baseline:  Goal status: INITIAL  2.  Patient will be able to complete his daily activities without his familiar pain exceeding 7/10. Baseline:  Goal status: INITIAL  3.  Patient will be able to demonstrate at least 4-/5 bilateral hip flexor strength for improved functional mobility.  Baseline:  Goal status: INITIAL  4.  Patient will be able to transfer from sitting to standing with minimal to no upper extremity support for improved independence.  Baseline: moderate upper extremity support required for this transfer  Goal status: INITIAL  PLAN:  PT FREQUENCY: 2x/week  PT DURATION: 3 weeks  PLANNED INTERVENTIONS: 97164- PT Re-evaluation, 97110-Therapeutic exercises, 97530- Therapeutic activity, 97112- Neuromuscular re-education, 97535- Self Care, 16109- Manual therapy, [id number]- Gait training, 97014- Electrical stimulation (unattended), 97035- Ultrasound, Patient/Family education, Stair training, Dry Needling, Joint mobilization, Spinal mobilization, Cryotherapy, and Moist heat.  PLAN FOR NEXT SESSION: Nustep, lifting mechanics, lower  extremity strengthening, and modalities as needed   Lane Pinon, PT 12/28/2023, 10:11 AM

## 2024-01-02 ENCOUNTER — Ambulatory Visit: Payer: 59

## 2024-01-02 DIAGNOSIS — M5459 Other low back pain: Secondary | ICD-10-CM | POA: Diagnosis not present

## 2024-01-02 DIAGNOSIS — R293 Abnormal posture: Secondary | ICD-10-CM

## 2024-01-02 NOTE — Therapy (Signed)
OUTPATIENT PHYSICAL THERAPY THORACOLUMBAR TREATMENT   Patient Name: Bradley Fisher MRN: [id number] DOB:January 09, 1966, 58 y.o., male Today's Date: 01/02/2024  END OF SESSION:  PT End of Session - 01/02/24 0932     Visit Number 3    Number of Visits 6    Date for PT Re-Evaluation 02/10/24    PT Start Time 0930    PT Stop Time 1016    PT Time Calculation (min) 46 min    Activity Tolerance Patient limited by pain    Behavior During Therapy [facility] for tasks assessed/performed               Past Medical History:  Diagnosis Date   Arthritis    Asthma    Depression    GERD (gastroesophageal reflux disease)    Hyperlipidemia    Hypertension    Seizures (HCC)    unknow etiology; on meds for this; no seizures since on meds 10 years ago   Past Surgical History:  Procedure Laterality Date   COLONOSCOPY WITH PROPOFOL N/A 05/29/2018   Procedure: COLONOSCOPY WITH PROPOFOL;  Surgeon: Corbin Ade, MD;  [facility];  Service: Endoscopy;  Laterality: N/A;  9:45am   KNEE ARTHROSCOPY WITH MEDIAL MENISECTOMY Left 11/11/2016   Procedure: LEFT KNEE ARTHROSCOPY WITH MEDIAL MENISECTOMY;  Surgeon: Vickki Hearing, MD;  Location: [facility];  Service: Orthopedics;  Laterality: Left;   Thumb surgery-left  02/25/2020   Patient Active Problem List   Diagnosis Date Noted   Abscess 05/20/2021   Tobacco use 01/22/2021   Gastroesophageal reflux disease without esophagitis 08/17/2017   Chronic seasonal allergic rhinitis due to pollen 03/09/2017   Depression, recurrent (HCC) 02/11/2017   Generalized OA 10/27/2016   Chronic midline low back pain without sciatica 10/27/2016   Hypertension, essential 10/27/2016   History of seizures 10/27/2016   REFERRING PROVIDER: Bedelia Person, MD   REFERRING DIAG: Other forms of scoliosis, thoracolumbar region   Rationale for Evaluation and Treatment: Rehabilitation  THERAPY DIAG:  Other low back pain  Abnormal posture  ONSET DATE: "a  long time"  SUBJECTIVE:                                                                                                                                                                                           SUBJECTIVE STATEMENT: Patient reports that he is hurting quite a bit today. He woke up with it hurting more this morning.   PERTINENT HISTORY:  Hypertension, allergies, osteoarthritis, history of seizures, current smoker, and asthma  PAIN:  Are you having pain? Yes: NPRS scale: 10/10 Pain location: back Pain description:  constant sharp pain Aggravating factors: transfers, walking, and sitting Relieving factors: "nothing helps"  PRECAUTIONS: None  RED FLAGS: None   WEIGHT BEARING RESTRICTIONS: No  FALLS:  Has patient fallen in last 6 months? No  LIVING ENVIRONMENT: Lives with: lives with their family Lives in: House/apartment Stairs: Yes: External: 5 steps; can reach both Has following equipment at home: None  OCCUPATION: retired  PLOF: Independent  PATIENT GOALS: reduced pain  NEXT MD VISIT: 01/16/24  OBJECTIVE:  Note: Objective measures were completed at Evaluation unless otherwise noted.  DIAGNOSTIC FINDINGS: 08/28/23 lumbar MRI IMPRESSION: 1. Advanced multilevel degenerative changes of the lumbar spine, as described above. Findings have progressed compared to 2007. 2. Moderate spinal canal narrowing at L1-L2, L3-L4, and L4-L5. 3. Multilevel neural foraminal narrowing, severe on the right at L3-L4 and on the left at L4-L5 and L5-S1.  COGNITION: Overall cognitive status: Within functional limits for tasks assessed     SENSATION: Patient reports no numbness or tingling  PALPATION: TTP: L4-5 spinous process (familiar pain) Increased tone noted with palpation along bilateral lumbar paraspinals  LUMBAR ROM:   AROM eval  Flexion 48; familiar pain   Extension 10; familiar pain  Right lateral flexion   Left lateral flexion   Right rotation 25% limited;  painful  Left rotation 25% limited; painful (L rotation more painful that R rotation)    (Blank rows = not tested)  LOWER EXTREMITY ROM: WFL for activities assessed  LOWER EXTREMITY MMT:    MMT Right eval Left eval  Hip flexion 3+/5 3+/5  Hip extension    Hip abduction    Hip adduction    Hip internal rotation    Hip external rotation    Knee flexion 4+/5 4/5  Knee extension 4-/5 4/5  Ankle dorsiflexion 4/5 4-/5  Ankle plantarflexion    Ankle inversion    Ankle eversion     (Blank rows = not tested)  GAIT: Assistive device utilized: None Level of assistance: Complete Independence  TREATMENT DATE:                                                                                                                                                                 01/02/24 EXERCISE LOG  Exercise Repetitions and Resistance Comments  Nustep  L2 x 12 minutes                    Blank cell = exercise not performed today  Manual Therapy Soft Tissue Mobilization: bilateral lumbar paraspinals, for reduced pain and tone Joint Mobilizations: L4-5, grade I-II CPA's   Modalities: no redness or adverse reaction to today's modalities  Date:  Unattended Estim: Lumbar, IFC @ 80-150 Hz w/ 40% scan, 15 mins, Pain and Tone  12/28/23 EXERCISE LOG  Exercise Repetitions and Resistance Comments  Nustep  L3 x 12 minutes   Rocker board  5 minutes    Ball roll out  3 minutes  Flexion           Blank cell = exercise not performed today  Modalities: no redness or adverse reaction to today's modalities  Date:  Unattended Estim: Lumbar, IFC @ 80-150 Hz w/ 40% scan, 15 mins, Pain and Tone  PATIENT EDUCATION:  Education details: plan of care, prognosis, and goals for physical therapy Person educated: Patient Education method: Explanation Education comprehension: verbalized understanding  HOME EXERCISE PROGRAM:   ASSESSMENT:  CLINICAL IMPRESSION: Patient  presented to treatment with elevated pain severity and irritability. Manual therapy focused on soft tissue mobilization to his lumbar paraspinals for reduced pain and tone with minimal effectiveness. He reported that his back felt "about the same" upon the conclusion of treatment. He continues to require skilled physical therapy to address his remaining impairments to maximize his functional mobility.   OBJECTIVE IMPAIRMENTS: decreased activity tolerance, difficulty walking, decreased ROM, decreased strength, hypomobility, impaired flexibility, impaired tone, and pain.   ACTIVITY LIMITATIONS: lifting, bending, sitting, standing, sleeping, transfers, and locomotion level  PARTICIPATION LIMITATIONS: meal prep, cleaning, laundry, shopping, and community activity  PERSONAL FACTORS: Behavior pattern, Past/current experiences, Time since onset of injury/illness/exacerbation, and 3+ comorbidities: Hypertension, allergies, osteoarthritis, history of seizures, current smoker, and asthma  are also affecting patient's functional outcome.   REHAB POTENTIAL: Fair    CLINICAL DECISION MAKING: Evolving/moderate complexity  EVALUATION COMPLEXITY: Moderate   GOALS: Goals reviewed with patient? Yes  LONG TERM GOALS: Target date: 01/16/24  Patient will be independent with his HEP.  Baseline:  Goal status: INITIAL  2.  Patient will be able to complete his daily activities without his familiar pain exceeding 7/10. Baseline:  Goal status: INITIAL  3.  Patient will be able to demonstrate at least 4-/5 bilateral hip flexor strength for improved functional mobility.  Baseline:  Goal status: INITIAL  4.  Patient will be able to transfer from sitting to standing with minimal to no upper extremity support for improved independence.  Baseline: moderate upper extremity support required for this transfer  Goal status: INITIAL  PLAN:  PT FREQUENCY: 2x/week  PT DURATION: 3 weeks  PLANNED INTERVENTIONS:  97164- PT Re-evaluation, 97110-Therapeutic exercises, 97530- Therapeutic activity, 97112- Neuromuscular re-education, 97535- Self Care, 16109- Manual therapy, [id number]- Gait training, 97014- Electrical stimulation (unattended), 97035- Ultrasound, Patient/Family education, Stair training, Dry Needling, Joint mobilization, Spinal mobilization, Cryotherapy, and Moist heat.  PLAN FOR NEXT SESSION: Nustep, lifting mechanics, lower extremity strengthening, and modalities as needed   Granville Lewis, PT 01/02/2024, 1:07 PM

## 2024-01-05 ENCOUNTER — Ambulatory Visit: Payer: 59 | Admitting: Physical Therapy

## 2024-01-05 DIAGNOSIS — M5459 Other low back pain: Secondary | ICD-10-CM

## 2024-01-05 DIAGNOSIS — R293 Abnormal posture: Secondary | ICD-10-CM

## 2024-01-05 NOTE — Therapy (Signed)
OUTPATIENT PHYSICAL THERAPY THORACOLUMBAR TREATMENT   Patient Name: Bradley Fisher MRN: [id number] DOB:01/18/1966, 58 y.o., male Today's Date: 01/05/2024  END OF SESSION:  PT End of Session - 01/05/24 1103     Visit Number 4    Number of Visits 6    Date for PT Re-Evaluation 02/10/24    PT Start Time 0940    PT Stop Time 1024    PT Time Calculation (min) 44 min    Activity Tolerance Patient limited by pain    Behavior During Therapy [facility] for tasks assessed/performed               Past Medical History:  Diagnosis Date   Arthritis    Asthma    Depression    GERD (gastroesophageal reflux disease)    Hyperlipidemia    Hypertension    Seizures (HCC)    unknow etiology; on meds for this; no seizures since on meds 10 years ago   Past Surgical History:  Procedure Laterality Date   COLONOSCOPY WITH PROPOFOL N/A 05/29/2018   Procedure: COLONOSCOPY WITH PROPOFOL;  Surgeon: Corbin Ade, MD;  [facility];  Service: Endoscopy;  Laterality: N/A;  9:45am   KNEE ARTHROSCOPY WITH MEDIAL MENISECTOMY Left 11/11/2016   Procedure: LEFT KNEE ARTHROSCOPY WITH MEDIAL MENISECTOMY;  Surgeon: Vickki Hearing, MD;  Location: [facility];  Service: Orthopedics;  Laterality: Left;   Thumb surgery-left  02/25/2020   Patient Active Problem List   Diagnosis Date Noted   Abscess 05/20/2021   Tobacco use 01/22/2021   Gastroesophageal reflux disease without esophagitis 08/17/2017   Chronic seasonal allergic rhinitis due to pollen 03/09/2017   Depression, recurrent (HCC) 02/11/2017   Generalized OA 10/27/2016   Chronic midline low back pain without sciatica 10/27/2016   Hypertension, essential 10/27/2016   History of seizures 10/27/2016   REFERRING PROVIDER: Bedelia Person, MD   REFERRING DIAG: Other forms of scoliosis, thoracolumbar region   Rationale for Evaluation and Treatment: Rehabilitation  THERAPY DIAG:  Other low back pain  Abnormal posture  ONSET DATE: "a  long time"  SUBJECTIVE:                                                                                                                                                                                           SUBJECTIVE STATEMENT: Got a "shot' Tuesday.  Pain is still high. PERTINENT HISTORY:  Hypertension, allergies, osteoarthritis, history of seizures, current smoker, and asthma  PAIN:  Are you having pain? Yes: NPRS scale: 8/10 Pain location: back Pain description: constant sharp pain Aggravating factors: transfers, walking, and sitting Relieving factors: "nothing  helps"  PRECAUTIONS: None  RED FLAGS: None   WEIGHT BEARING RESTRICTIONS: No  FALLS:  Has patient fallen in last 6 months? No  LIVING ENVIRONMENT: Lives with: lives with their family Lives in: House/apartment Stairs: Yes: External: 5 steps; can reach both Has following equipment at home: None  OCCUPATION: retired  PLOF: Independent  PATIENT GOALS: reduced pain  NEXT MD VISIT: 01/16/24  OBJECTIVE:  Note: Objective measures were completed at Evaluation unless otherwise noted.  DIAGNOSTIC FINDINGS: 08/28/23 lumbar MRI IMPRESSION: 1. Advanced multilevel degenerative changes of the lumbar spine, as described above. Findings have progressed compared to 2007. 2. Moderate spinal canal narrowing at L1-L2, L3-L4, and L4-L5. 3. Multilevel neural foraminal narrowing, severe on the right at L3-L4 and on the left at L4-L5 and L5-S1.  COGNITION: Overall cognitive status: Within functional limits for tasks assessed     SENSATION: Patient reports no numbness or tingling  PALPATION: TTP: L4-5 spinous process (familiar pain) Increased tone noted with palpation along bilateral lumbar paraspinals  LUMBAR ROM:   AROM eval  Flexion 48; familiar pain   Extension 10; familiar pain  Right lateral flexion   Left lateral flexion   Right rotation 25% limited; painful  Left rotation 25% limited; painful (L rotation  more painful that R rotation)    (Blank rows = not tested)  LOWER EXTREMITY ROM: WFL for activities assessed  LOWER EXTREMITY MMT:    MMT Right eval Left eval  Hip flexion 3+/5 3+/5  Hip extension    Hip abduction    Hip adduction    Hip internal rotation    Hip external rotation    Knee flexion 4+/5 4/5  Knee extension 4-/5 4/5  Ankle dorsiflexion 4/5 4-/5  Ankle plantarflexion    Ankle inversion    Ankle eversion     (Blank rows = not tested)  GAIT: Assistive device utilized: None Level of assistance: Complete Independence  TREATMENT DATE:          01/05/24:  Recumbent bike on level 2 x 8 minutes f/b U/S at 1.50 W/CM2 to patient lumbvar musculature with patient in right sdly position with folded pillow between knees for comfort f/b STW/M x 6 minutes f/b    HMP and IFC at 80-150 Hz on 40% scan x 8 minutes (patient requested a shortened treatment).  Normal modality response following removal of modality.                                                                                                                                                         01/02/24 EXERCISE LOG  Exercise Repetitions and Resistance Comments  Nustep  L2 x 12 minutes                    Blank cell = exercise not performed  today  Manual Therapy Soft Tissue Mobilization: bilateral lumbar paraspinals, for reduced pain and tone Joint Mobilizations: L4-5, grade I-II CPA's   Modalities: no redness or adverse reaction to today's modalities  Date:  Unattended Estim: Lumbar, IFC @ 80-150 Hz w/ 40% scan, 15 mins, Pain and Tone                                   12/28/23 EXERCISE LOG  Exercise Repetitions and Resistance Comments  Nustep  L3 x 12 minutes   Rocker board  5 minutes    Ball roll out  3 minutes  Flexion           Blank cell = exercise not performed today  Modalities: no redness or adverse reaction to today's modalities  Date:  Unattended Estim: Lumbar, IFC @ 80-150 Hz w/ 40%  scan, 15 mins, Pain and Tone  PATIENT EDUCATION:  Education details: plan of care, prognosis, and goals for physical therapy Person educated: Patient Education method: Explanation Education comprehension: verbalized understanding  HOME EXERCISE PROGRAM:   ASSESSMENT:  CLINICAL IMPRESSION: Patient with continued high pain-level.  Received injection on 01/03/24.  He has two PT visits remaining.   OBJECTIVE IMPAIRMENTS: decreased activity tolerance, difficulty walking, decreased ROM, decreased strength, hypomobility, impaired flexibility, impaired tone, and pain.   ACTIVITY LIMITATIONS: lifting, bending, sitting, standing, sleeping, transfers, and locomotion level  PARTICIPATION LIMITATIONS: meal prep, cleaning, laundry, shopping, and community activity  PERSONAL FACTORS: Behavior pattern, Past/current experiences, Time since onset of injury/illness/exacerbation, and 3+ comorbidities: Hypertension, allergies, osteoarthritis, history of seizures, current smoker, and asthma  are also affecting patient's functional outcome.   REHAB POTENTIAL: Fair    CLINICAL DECISION MAKING: Evolving/moderate complexity  EVALUATION COMPLEXITY: Moderate   GOALS: Goals reviewed with patient? Yes  LONG TERM GOALS: Target date: 01/16/24  Patient will be independent with his HEP.  Baseline:  Goal status: INITIAL  2.  Patient will be able to complete his daily activities without his familiar pain exceeding 7/10. Baseline:  Goal status: INITIAL  3.  Patient will be able to demonstrate at least 4-/5 bilateral hip flexor strength for improved functional mobility.  Baseline:  Goal status: INITIAL  4.  Patient will be able to transfer from sitting to standing with minimal to no upper extremity support for improved independence.  Baseline: moderate upper extremity support required for this transfer  Goal status: INITIAL  PLAN:  PT FREQUENCY: 2x/week  PT DURATION: 3 weeks  PLANNED INTERVENTIONS:  97164- PT Re-evaluation, 97110-Therapeutic exercises, 97530- Therapeutic activity, 97112- Neuromuscular re-education, 97535- Self Care, [id number]- Manual therapy, [id number]- Gait training, 97014- Electrical stimulation (unattended), 97035- Ultrasound, Patient/Family education, Stair training, Dry Needling, Joint mobilization, Spinal mobilization, Cryotherapy, and Moist heat.  PLAN FOR NEXT SESSION: Nustep, lifting mechanics, lower extremity strengthening, and modalities as needed   Forever Arechiga, [place], PT 01/05/2024, 11:03 AM

## 2024-01-09 ENCOUNTER — Ambulatory Visit: Payer: 59 | Admitting: *Deleted

## 2024-01-09 DIAGNOSIS — M5459 Other low back pain: Secondary | ICD-10-CM

## 2024-01-09 DIAGNOSIS — R293 Abnormal posture: Secondary | ICD-10-CM

## 2024-01-09 NOTE — Therapy (Signed)
OUTPATIENT PHYSICAL THERAPY THORACOLUMBAR TREATMENT   Patient Name: Bradley Fisher MRN: [id number] DOB:03-29-1966, 58 y.o., male Today's Date: 01/09/2024  END OF SESSION:  PT End of Session - 01/09/24 0936     Visit Number 5    Number of Visits 6    Date for PT Re-Evaluation 02/10/24    PT Start Time 0930    PT Stop Time 1018    PT Time Calculation (min) 48 min               Past Medical History:  Diagnosis Date   Arthritis    Asthma    Depression    GERD (gastroesophageal reflux disease)    Hyperlipidemia    Hypertension    Seizures (HCC)    unknow etiology; on meds for this; no seizures since on meds 10 years ago   Past Surgical History:  Procedure Laterality Date   COLONOSCOPY WITH PROPOFOL N/A 05/29/2018   Procedure: COLONOSCOPY WITH PROPOFOL;  Surgeon: Corbin Ade, MD;  Location: [facility];  Service: Endoscopy;  Laterality: N/A;  9:45am   KNEE ARTHROSCOPY WITH MEDIAL MENISECTOMY Left 11/11/2016   Procedure: LEFT KNEE ARTHROSCOPY WITH MEDIAL MENISECTOMY;  Surgeon: Vickki Hearing, MD;  Location: [facility];  Service: Orthopedics;  Laterality: Left;   Thumb surgery-left  02/25/2020   Patient Active Problem List   Diagnosis Date Noted   Abscess 05/20/2021   Tobacco use 01/22/2021   Gastroesophageal reflux disease without esophagitis 08/17/2017   Chronic seasonal allergic rhinitis due to pollen 03/09/2017   Depression, recurrent (HCC) 02/11/2017   Generalized OA 10/27/2016   Chronic midline low back pain without sciatica 10/27/2016   Hypertension, essential 10/27/2016   History of seizures 10/27/2016   REFERRING PROVIDER: Bedelia Person, MD   REFERRING DIAG: Other forms of scoliosis, thoracolumbar region   Rationale for Evaluation and Treatment: Rehabilitation  THERAPY DIAG:  Other low back pain  Abnormal posture  ONSET DATE: "a long time"  SUBJECTIVE:                                                                                                                                                                                            SUBJECTIVE STATEMENT:   Pain is still high.  Pain in LT leg 10/10. Not doing well PERTINENT HISTORY:  Hypertension, allergies, osteoarthritis, history of seizures, current smoker, and asthma  PAIN:  Are you having pain? Yes: NPRS scale: 10/10 Pain location: back Pain description: constant sharp pain Aggravating factors: transfers, walking, and sitting Relieving factors: "nothing helps"  PRECAUTIONS: None  RED FLAGS: None   WEIGHT BEARING RESTRICTIONS:  No  FALLS:  Has patient fallen in last 6 months? No  LIVING ENVIRONMENT: Lives with: lives with their family Lives in: House/apartment Stairs: Yes: External: 5 steps; can reach both Has following equipment at home: None  OCCUPATION: retired  PLOF: Independent  PATIENT GOALS: reduced pain  NEXT MD VISIT: 01/16/24  OBJECTIVE:  Note: Objective measures were completed at Evaluation unless otherwise noted.  DIAGNOSTIC FINDINGS: 08/28/23 lumbar MRI IMPRESSION: 1. Advanced multilevel degenerative changes of the lumbar spine, as described above. Findings have progressed compared to 2007. 2. Moderate spinal canal narrowing at L1-L2, L3-L4, and L4-L5. 3. Multilevel neural foraminal narrowing, severe on the right at L3-L4 and on the left at L4-L5 and L5-S1.  COGNITION: Overall cognitive status: Within functional limits for tasks assessed     SENSATION: Patient reports no numbness or tingling  PALPATION: TTP: L4-5 spinous process (familiar pain) Increased tone noted with palpation along bilateral lumbar paraspinals  LUMBAR ROM:   AROM eval  Flexion 48; familiar pain   Extension 10; familiar pain  Right lateral flexion   Left lateral flexion   Right rotation 25% limited; painful  Left rotation 25% limited; painful (L rotation more painful that R rotation)    (Blank rows = not tested)  LOWER EXTREMITY ROM: WFL for  activities assessed  LOWER EXTREMITY MMT:    MMT Right eval Left eval  Hip flexion 3+/5 3+/5  Hip extension    Hip abduction    Hip adduction    Hip internal rotation    Hip external rotation    Knee flexion 4+/5 4/5  Knee extension 4-/5 4/5  Ankle dorsiflexion 4/5 4-/5  Ankle plantarflexion    Ankle inversion    Ankle eversion     (Blank rows = not tested)  GAIT: Assistive device utilized: None Level of assistance: Complete Independence  TREATMENT DATE:          01/09/24:  Nustep on level 1 x 10 minutes   U/S at 1.50 W/CM2  x to patient lumbar musculature with patient in right sdly position with folded pillow between knees for comfort       HMP and IFC at 80-150 Hz on 40% scan x 15 minutes  Normal modality response following removal of modality.                                                                                                                                                         01/02/24 EXERCISE LOG  Exercise Repetitions and Resistance Comments  Nustep  L2 x 12 minutes                    Blank cell = exercise not performed today  Manual Therapy Soft Tissue Mobilization: bilateral lumbar paraspinals, for reduced pain and tone Joint Mobilizations: L4-5,  grade I-II CPA's   Modalities: no redness or adverse reaction to today's modalities  Date:  Unattended Estim: Lumbar, IFC @ 80-150 Hz w/ 40% scan, 15 mins, Pain and Tone                                   12/28/23 EXERCISE LOG  Exercise Repetitions and Resistance Comments  Nustep  L3 x 12 minutes   Rocker board  5 minutes    Ball roll out  3 minutes  Flexion           Blank cell = exercise not performed today  Modalities: no redness or adverse reaction to today's modalities  Date:  Unattended Estim: Lumbar, IFC @ 80-150 Hz w/ 40% scan, 15 mins, Pain and Tone  PATIENT EDUCATION:  Education details: plan of care, prognosis, and goals for physical therapy Person educated:  Patient Education method: Explanation Education comprehension: verbalized understanding  HOME EXERCISE PROGRAM:   ASSESSMENT:  CLINICAL IMPRESSION: Patient with continued high pain-level 10/10 today and reports no relief.  Received injection on 01/03/24, but no help. Pt still with c/o pain Bil. LB paras. He did okay with Rx with short term relief only.  He has one PT visit remaining and then to MD.   OBJECTIVE IMPAIRMENTS: decreased activity tolerance, difficulty walking, decreased ROM, decreased strength, hypomobility, impaired flexibility, impaired tone, and pain.   ACTIVITY LIMITATIONS: lifting, bending, sitting, standing, sleeping, transfers, and locomotion level  PARTICIPATION LIMITATIONS: meal prep, cleaning, laundry, shopping, and community activity  PERSONAL FACTORS: Behavior pattern, Past/current experiences, Time since onset of injury/illness/exacerbation, and 3+ comorbidities: Hypertension, allergies, osteoarthritis, history of seizures, current smoker, and asthma  are also affecting patient's functional outcome.   REHAB POTENTIAL: Fair    CLINICAL DECISION MAKING: Evolving/moderate complexity  EVALUATION COMPLEXITY: Moderate   GOALS: Goals reviewed with patient? Yes  LONG TERM GOALS: Target date: 01/16/24  Patient will be independent with his HEP.  Baseline:  Goal status: On going  2.  Patient will be able to complete his daily activities without his familiar pain exceeding 7/10. Baseline:  Goal status: On going  3.  Patient will be able to demonstrate at least 4-/5 bilateral hip flexor strength for improved functional mobility.  Baseline:  Goal status: On going  4.  Patient will be able to transfer from sitting to standing with minimal to no upper extremity support for improved independence.  Baseline: moderate upper extremity support required for this transfer  Goal status: On going  PLAN:  PT FREQUENCY: 2x/week  PT DURATION: 3 weeks  PLANNED  INTERVENTIONS: 97164- PT Re-evaluation, 97110-Therapeutic exercises, 97530- Therapeutic activity, 97112- Neuromuscular re-education, 97535- Self Care, [id number]- Manual therapy, [id number]- Gait training, 97014- Electrical stimulation (unattended), 97035- Ultrasound, Patient/Family education, Stair training, Dry Needling, Joint mobilization, Spinal mobilization, Cryotherapy, and Moist heat.  PLAN FOR NEXT SESSION: Nustep, lifting mechanics, lower extremity strengthening, and modalities as needed   Dameon Soltis,CHRIS, PTA 01/09/2024, 10:18 AM

## 2024-01-12 ENCOUNTER — Ambulatory Visit: Payer: 59

## 2024-01-12 DIAGNOSIS — R293 Abnormal posture: Secondary | ICD-10-CM

## 2024-01-12 DIAGNOSIS — M5459 Other low back pain: Secondary | ICD-10-CM | POA: Diagnosis not present

## 2024-01-12 NOTE — Therapy (Addendum)
OUTPATIENT PHYSICAL THERAPY THORACOLUMBAR TREATMENT   Patient Name: Bradley Fisher MRN: [id number] DOB:1966-11-18, 58 y.o., male Today's Date: 01/12/2024  END OF SESSION:  PT End of Session - 01/12/24 0933     Visit Number 6    Number of Visits 6    Date for PT Re-Evaluation 02/10/24    PT Start Time 0930               Past Medical History:  Diagnosis Date   Arthritis    Asthma    Depression    GERD (gastroesophageal reflux disease)    Hyperlipidemia    Hypertension    Seizures (HCC)    unknow etiology; on meds for this; no seizures since on meds 10 years ago   Past Surgical History:  Procedure Laterality Date   COLONOSCOPY WITH PROPOFOL N/A 05/29/2018   Procedure: COLONOSCOPY WITH PROPOFOL;  Surgeon: Corbin Ade, MD;  Location: [facility];  Service: Endoscopy;  Laterality: N/A;  9:45am   KNEE ARTHROSCOPY WITH MEDIAL MENISECTOMY Left 11/11/2016   Procedure: LEFT KNEE ARTHROSCOPY WITH MEDIAL MENISECTOMY;  Surgeon: Vickki Hearing, MD;  Location: [facility];  Service: Orthopedics;  Laterality: Left;   Thumb surgery-left  02/25/2020   Patient Active Problem List   Diagnosis Date Noted   Abscess 05/20/2021   Tobacco use 01/22/2021   Gastroesophageal reflux disease without esophagitis 08/17/2017   Chronic seasonal allergic rhinitis due to pollen 03/09/2017   Depression, recurrent (HCC) 02/11/2017   Generalized OA 10/27/2016   Chronic midline low back pain without sciatica 10/27/2016   Hypertension, essential 10/27/2016   History of seizures 10/27/2016   REFERRING PROVIDER: Bedelia Person, MD   REFERRING DIAG: Other forms of scoliosis, thoracolumbar region   Rationale for Evaluation and Treatment: Rehabilitation  THERAPY DIAG:  Other low back pain  Abnormal posture  ONSET DATE: "a long time"  SUBJECTIVE:                                                                                                                                                                                            SUBJECTIVE STATEMENT:   Pt reports 9/10 low back and LLE pain today.  Pt ready for discharge today, has an MD appointment Monday.   PERTINENT HISTORY:  Hypertension, allergies, osteoarthritis, history of seizures, current smoker, and asthma  PAIN:  Are you having pain? Yes: NPRS scale: 9/10 Pain location: back Pain description: constant sharp pain Aggravating factors: transfers, walking, and sitting Relieving factors: "nothing helps"  PRECAUTIONS: None  RED FLAGS: None   WEIGHT BEARING RESTRICTIONS: No  FALLS:  Has patient fallen  in last 6 months? No  LIVING ENVIRONMENT: Lives with: lives with their family Lives in: House/apartment Stairs: Yes: External: 5 steps; can reach both Has following equipment at home: None  OCCUPATION: retired  PLOF: Independent  PATIENT GOALS: reduced pain  NEXT MD VISIT: 01/16/24  OBJECTIVE:  Note: Objective measures were completed at Evaluation unless otherwise noted.  DIAGNOSTIC FINDINGS: 08/28/23 lumbar MRI IMPRESSION: 1. Advanced multilevel degenerative changes of the lumbar spine, as described above. Findings have progressed compared to 2007. 2. Moderate spinal canal narrowing at L1-L2, L3-L4, and L4-L5. 3. Multilevel neural foraminal narrowing, severe on the right at L3-L4 and on the left at L4-L5 and L5-S1.  COGNITION: Overall cognitive status: Within functional limits for tasks assessed     SENSATION: Patient reports no numbness or tingling  PALPATION: TTP: L4-5 spinous process (familiar pain) Increased tone noted with palpation along bilateral lumbar paraspinals  LUMBAR ROM:   AROM eval  Flexion 48; familiar pain   Extension 10; familiar pain  Right lateral flexion   Left lateral flexion   Right rotation 25% limited; painful  Left rotation 25% limited; painful (L rotation more painful that R rotation)    (Blank rows = not tested)  LOWER EXTREMITY ROM: WFL for activities  assessed  LOWER EXTREMITY MMT:    MMT Right eval Left eval  Hip flexion 3+/5 3+/5  Hip extension    Hip abduction    Hip adduction    Hip internal rotation    Hip external rotation    Knee flexion 4+/5 4/5  Knee extension 4-/5 4/5  Ankle dorsiflexion 4/5 4-/5  Ankle plantarflexion    Ankle inversion    Ankle eversion     (Blank rows = not tested)  GAIT: Assistive device utilized: None Level of assistance: Complete Independence  TREATMENT DATE:          01/12/24     EXERCISE LOG  Exercise Repetitions and Resistance Comments  Nustep  L4 x 15 minutes        Blank cell = exercise not performed today   Modalities  Date:  Unattended Estim: Lumbar, IFC 80-150 Hz, 15 mins, Pain and Tone Combo: Lumbar, 1.5 w/cm2; 100%, 12 mins, Pain Hot Pack: Lumbar, 15 mins, Pain and Tone   01/09/24:  Nustep on level 1 x 10 minutes   U/S at 1.50 W/CM2  x to patient lumbar musculature with patient in right sdly position with folded pillow between knees for comfort       HMP and IFC at 80-150 Hz on 40% scan x 15 minutes  Normal modality response following removal of modality.  01/02/24 EXERCISE LOG  Exercise Repetitions and Resistance Comments  Nustep  L2 x 12 minutes                    Blank cell = exercise not performed today  Manual Therapy Soft Tissue Mobilization: bilateral lumbar paraspinals, for reduced pain and tone Joint Mobilizations: L4-5, grade I-II CPA's   Modalities: no redness or adverse reaction to today's modalities  Date:  Unattended Estim: Lumbar, IFC @ 80-150 Hz w/ 40% scan, 15 mins, Pain and Tone                                   PATIENT EDUCATION:  Education details: plan of care, prognosis, and goals for physical therapy Person educated: Patient Education method: Explanation Education comprehension:  verbalized understanding  HOME EXERCISE PROGRAM:   ASSESSMENT:  CLINICAL IMPRESSION: Pt arrives for today's treatment session reporting 9/10 left LE and low back pain.  Pt states that he has an MD appointment on Monday.  Pt able to perform STS from treatment table without use of BUE support, meeting his LTG.  Pt also able to demonstrate 4-/5 bil hip flexor strength today, meeting his strength goal as well.  Normal responses to all modalities performed today.  Pt encouraged to call the facility with any questions or concerns.  Pt ready to discharge at this time.   PHYSICAL THERAPY DISCHARGE SUMMARY  Visits from Start of Care: 6  Current functional level related to goals / functional outcomes: Patient was able to partially meet his goals for skilled physical therapy. However, pain remains his primary limiting factor.    Remaining deficits: Pain   Education / Equipment: HEP   Patient agrees to discharge. Patient goals were partially met. Patient is being discharged due to maximized rehab potential.   Candi Leash, PT, DPT    OBJECTIVE IMPAIRMENTS: decreased activity tolerance, difficulty walking, decreased ROM, decreased strength, hypomobility, impaired flexibility, impaired tone, and pain.   ACTIVITY LIMITATIONS: lifting, bending, sitting, standing, sleeping, transfers, and locomotion level  PARTICIPATION LIMITATIONS: meal prep, cleaning, laundry, shopping, and community activity  PERSONAL FACTORS: Behavior pattern, Past/current experiences, Time since onset of injury/illness/exacerbation, and 3+ comorbidities: Hypertension, allergies, osteoarthritis, history of seizures, current smoker, and asthma  are also affecting patient's functional outcome.   REHAB POTENTIAL: Fair    CLINICAL DECISION MAKING: Evolving/moderate complexity  EVALUATION COMPLEXITY: Moderate   GOALS: Goals reviewed with patient? Yes  LONG TERM GOALS: Target date: 01/16/24  Patient will be independent  with his HEP.  Baseline:  Goal status: MET  2.  Patient will be able to complete his daily activities without his familiar pain exceeding 7/10. Baseline:  Goal status: On going  3.  Patient will be able to demonstrate at least 4-/5 bilateral hip flexor strength for improved functional mobility.  Baseline: 1/30: 4-/5 Goal status: MET  4.  Patient will be able to transfer from sitting to standing with minimal to no upper extremity support for improved independence.  Baseline: moderate upper extremity support required for this transfer  Goal status: MET  PLAN:  PT FREQUENCY: 2x/week  PT DURATION: 3 weeks  PLANNED INTERVENTIONS: 97164- PT Re-evaluation, 97110-Therapeutic exercises, 97530- Therapeutic activity, 97112- Neuromuscular re-education, 97535- Self Care, 95621- Manual therapy, [id number]- Gait training, 97014- Electrical stimulation (unattended), 97035- Ultrasound, Patient/Family education, Stair training, Dry Needling, Joint mobilization, Spinal mobilization, Cryotherapy, and Moist heat.  PLAN FOR NEXT SESSION: Nustep,  lifting mechanics, lower extremity strengthening, and modalities as needed   Newman Pies, PTA 01/12/2024, 10:56 AM

## 2024-01-20 ENCOUNTER — Other Ambulatory Visit: Payer: Self-pay | Admitting: Neurosurgery

## 2024-01-20 DIAGNOSIS — M4185 Other forms of scoliosis, thoracolumbar region: Secondary | ICD-10-CM

## 2024-01-30 ENCOUNTER — Other Ambulatory Visit: Payer: 59

## 2024-01-31 ENCOUNTER — Ambulatory Visit
Admission: RE | Admit: 2024-01-31 | Discharge: 2024-01-31 | Disposition: A | Discharge status: Home / Self Care | Payer: 59 | Source: Ambulatory Visit | Attending: Neurosurgery | Admitting: Neurosurgery

## 2024-01-31 DIAGNOSIS — M4185 Other forms of scoliosis, thoracolumbar region: Secondary | ICD-10-CM

## 2024-03-02 ENCOUNTER — Other Ambulatory Visit: Payer: Self-pay

## 2024-03-02 DIAGNOSIS — M419 Scoliosis, unspecified: Secondary | ICD-10-CM | POA: Insufficient documentation

## 2024-03-05 NOTE — Progress Notes (Unsigned)
 Patient name: Bradley Fisher MRN: [id number] DOB: 25-Oct-1966 Sex: male  REASON FOR CONSULT: Abdominal exposure for L4-S1 ALIF  HPI: Bradley Fisher is a 58 y.o. male, with history of hypertension, hyperlipidemia, seizures that presents for evaluation of anterior spine exposure for L4-S1 ALIF.  Patient has multilevel thoracolumbar degenerative disease with multilevel spinal stenosis.  Evaluated by Dr. Maisie Fus with neurosurgery.  He has recommended L4-L5 and L5-S1 ALIF with a L1-L2 and L3-L4 TLIF for stage I and then a stage II T10 to pelvis fusion.  Past Medical History:  Diagnosis Date   Arthritis    Asthma    Depression    GERD (gastroesophageal reflux disease)    Hyperlipidemia    Hypertension    Seizures (HCC)    unknow etiology; on meds for this; no seizures since on meds 10 years ago    Past Surgical History:  Procedure Laterality Date   COLONOSCOPY WITH PROPOFOL N/A 05/29/2018   Procedure: COLONOSCOPY WITH PROPOFOL;  Surgeon: Corbin Ade, MD;  Location: [facility];  Service: Endoscopy;  Laterality: N/A;  9:45am   KNEE ARTHROSCOPY WITH MEDIAL MENISECTOMY Left 11/11/2016   Procedure: LEFT KNEE ARTHROSCOPY WITH MEDIAL MENISECTOMY;  Surgeon: Vickki Hearing, MD;  Location: [facility];  Service: Orthopedics;  Laterality: Left;   Thumb surgery-left  02/25/2020    Family History  Problem Relation Age of Onset   Hypertension Mother    Diabetes Mother    Hypertension Father    Diabetes Father    Diabetes Brother    Colon cancer Neg Hx     SOCIAL HISTORY: Social History   Socioeconomic History   Marital status: Married    Spouse name: Not on file   Number of children: 0   Years of education: Not on file   Highest education level: 7th grade  Occupational History   Occupation: Disabled  Tobacco Use   Smoking status: Some Days    Types: Cigars    Start date: 01/22/1973    Passive exposure: Past   Smokeless tobacco: Current    Types: Snuff   Tobacco  comments:    Smokes 1 time a month/03/03/22  Vaping Use   Vaping status: Never Used  Substance and Sexual Activity   Alcohol use: Yes    Alcohol/week: 12.0 standard drinks of alcohol    Types: 12 Cans of beer per week    Comment: drinks some weekends.    Drug use: No   Sexual activity: Yes    Birth control/protection: None  Other Topics Concern   Not on file  Social History Narrative   Not on file   Social Drivers of Health   Financial Resource Strain: Low Risk  (02/12/2019)   Overall Financial Resource Strain (CARDIA)    Difficulty of Paying Living Expenses: Not hard at all  Food Insecurity: No Food Insecurity (02/12/2019)   Hunger Vital Sign    Worried About Running Out of Food in the Last Year: Never true    Ran Out of Food in the Last Year: Never true  Transportation Needs: No Transportation Needs (02/12/2019)   PRAPARE - Administrator, Civil Service (Medical): No    Lack of Transportation (Non-Medical): No  Physical Activity: Inactive (02/12/2019)   Exercise Vital Sign    Days of Exercise per Week: 0 days    Minutes of Exercise per Session: 0 min  Stress: No Stress Concern Present (02/12/2019)   [employer] of Occupational Health -  Occupational Stress Questionnaire    Feeling of Stress : Not at all  Social Connections: Moderately Integrated (02/12/2019)   Social Connection and Isolation Panel [NHANES]    Frequency of Communication with Friends and Family: Twice a week    Frequency of Social Gatherings with Friends and Family: More than three times a week    Attends Religious Services: More than 4 times per year    Active Member of [employer] or Organizations: No    Attends Banker Meetings: Never    Marital Status: Married  Catering manager Violence: Not At Risk (02/12/2019)   Humiliation, Afraid, Rape, and Kick questionnaire    Fear of Current or Ex-Partner: No    Emotionally Abused: No    Physically Abused: No    Sexually Abused: No    No Known  Allergies  Current Outpatient Medications  Medication Sig Dispense Refill   Albuterol Sulfate (PROAIR RESPICLICK) 108 (90 Base) MCG/ACT AEPB INHALE 2 PUFFS EVERY 6 HOURS AS NEEDED FOR WHEEZING OR SHORTNESS OF BREATH     amLODipine (NORVASC) 5 MG tablet Take 1 tablet by mouth daily.     amlodipine-olmesartan (AZOR) 10-20 MG tablet Take 1 tablet by mouth daily.     atorvastatin (LIPITOR) 10 MG tablet TAKE 1 TABLET DAILY 90 tablet 3   celecoxib (CELEBREX) 200 MG capsule Take 200 mg by mouth daily.     cetirizine (ZYRTEC) 10 MG tablet Take 1 tablet (10 mg total) by mouth daily. 90 tablet 3   clotrimazole-betamethasone (LOTRISONE) cream Apply topically 2 (two) times daily.     esomeprazole (NEXIUM) 40 MG capsule Take 1 capsule by mouth daily.     fluticasone (FLONASE) 50 MCG/ACT nasal spray Place 2 sprays into both nostrils daily. 16 g 6   gabapentin (NEURONTIN) 400 MG capsule Take 1 capsule (400 mg total) by mouth at bedtime. For PAIN (Patient taking differently: Take 400 mg by mouth 2 (two) times daily. For PAIN) 90 capsule 1   HYDROcodone-acetaminophen (NORCO) 10-325 MG tablet Take 1 tablet by mouth 2 (two) times daily as needed for severe pain. 30 tablet 0   lisinopril (ZESTRIL) 20 MG tablet Take 1 tablet by mouth daily.     meloxicam (MOBIC) 15 MG tablet Take 1 tablet (15 mg total) by mouth daily. 90 tablet 1   methocarbamol (ROBAXIN) 750 MG tablet Take 750 mg by mouth 4 (four) times daily.     naloxone (NARCAN) nasal spray 4 mg/0.1 mL Place 1 spray into the nose. (Patient not taking: Reported on 03/03/2022)     PROAIR HFA 108 (90 Base) MCG/ACT inhaler Inhale 2 puffs into the lungs every 6 (six) hours as needed for wheezing or shortness of breath. 18 g 2   RESTASIS 0.05 % ophthalmic emulsion 1 drop 2 (two) times daily. (Patient not taking: Reported on 03/03/2022)     rosuvastatin (CRESTOR) 5 MG tablet Take 5 mg by mouth daily.     sertraline (ZOLOFT) 100 MG tablet Take 1 tablet by mouth daily.      No current facility-administered medications for this visit.    REVIEW OF SYSTEMS:  [X]  denotes positive finding, [ ]  denotes negative finding Cardiac  Comments:  Chest pain or chest pressure:    Shortness of breath upon exertion:    Short of breath when lying flat:    Irregular heart rhythm:        Vascular    Pain in calf, thigh, or hip brought on by ambulation:  Pain in feet at night that wakes you up from your sleep:     Blood clot in your veins:    Leg swelling:         Pulmonary    Oxygen at home:    Productive cough:     Wheezing:         Neurologic    Sudden weakness in arms or legs:     Sudden numbness in arms or legs:     Sudden onset of difficulty speaking or slurred speech:    Temporary loss of vision in one eye:     Problems with dizziness:         Gastrointestinal    Blood in stool:     Vomited blood:         Genitourinary    Burning when urinating:     Blood in urine:        Psychiatric    Major depression:         Hematologic    Bleeding problems:    Problems with blood clotting too easily:        Skin    Rashes or ulcers:        Constitutional    Fever or chills:      PHYSICAL EXAM: There were no vitals filed for this visit.  GENERAL: The patient is a well-nourished male, in no acute distress. The vital signs are documented above. CARDIAC: There is a regular rate and rhythm.  VASCULAR:  Bilateral femoral pulses palpable Bilateral PT pulses palpable PULMONARY: No respiratory distress. ABDOMEN: Soft and non-tender. MUSCULOSKELETAL: There are no major deformities or cyanosis. NEUROLOGIC: No focal weakness or paresthesias are detected. SKIN: There are no ulcers or rashes noted. PSYCHIATRIC: The patient has a normal affect.  DATA:   MRI Lumbar Reviewed on 09/12/23:    Assessment/Plan:  58 y.o. male, with history of hypertension, hyperlipidemia, seizures, and chronic back pain that presents for evaluation of anterior spine  exposure for L4-S1 ALIF.  I reviewed the MRI as pictured above and discussed he would be a good candidate for anterior approach.  I discussed paramedian incision over the left rectus muscle then entering into the retroperitoneum to mobilize peritoneum and left ureter across midline.  I discussed mobilizing iliac artery and vein.  Discussed risk of injury to the above structures including a vascular injury.  Discussed risk of retrograde ejaculation and hernia.  Look forward to assisting Dr. Maisie Fus.  All questions answered.   Cephus Shelling, MD [facility] Office: [phone]

## 2024-03-06 ENCOUNTER — Ambulatory Visit (INDEPENDENT_AMBULATORY_CARE_PROVIDER_SITE_OTHER): Admitting: Vascular Surgery

## 2024-03-06 ENCOUNTER — Encounter: Payer: Self-pay | Admitting: Vascular Surgery

## 2024-03-06 VITALS — BP 146/86 | HR 64 | Temp 97.8°F | Resp 20 | Ht 73.0 in | Wt 211.7 lb

## 2024-03-06 DIAGNOSIS — G8929 Other chronic pain: Secondary | ICD-10-CM | POA: Diagnosis not present

## 2024-03-06 DIAGNOSIS — M545 Low back pain, unspecified: Secondary | ICD-10-CM

## 2024-03-12 ENCOUNTER — Encounter: Payer: Self-pay | Admitting: Pulmonary Disease

## 2024-03-12 ENCOUNTER — Ambulatory Visit: Admitting: Pulmonary Disease

## 2024-03-12 VITALS — BP 147/88 | HR 62 | Ht 72.0 in | Wt 209.8 lb

## 2024-03-12 DIAGNOSIS — R0600 Dyspnea, unspecified: Secondary | ICD-10-CM | POA: Diagnosis not present

## 2024-03-12 DIAGNOSIS — Z01811 Encounter for preprocedural respiratory examination: Secondary | ICD-10-CM | POA: Diagnosis not present

## 2024-03-12 DIAGNOSIS — R0689 Other abnormalities of breathing: Secondary | ICD-10-CM

## 2024-03-12 NOTE — Patient Instructions (Signed)
 VISIT SUMMARY:  You came in today for a preoperative evaluation for your upcoming back surgery. We discussed your history of shortness of breath and the need for further evaluation to ensure you are ready for surgery. We also reviewed your back condition and the surgical plans.  YOUR PLAN:  -CHRONIC SHORTNESS OF BREATH: Chronic shortness of breath means you have been experiencing difficulty breathing for a long time. We need to evaluate your lungs to make sure there are no significant issues before your surgery. We will order a lung function test, a chest CT scan, and perform a walk test to check your oxygen levels.  -SURGICAL CLEARANCE FOR BACK SURGERY: Surgical clearance means we need to make sure you are healthy enough to undergo surgery. We will complete a pulmonary evaluation, including the lung function test and chest CT scan, and communicate with your surgical team about your readiness for surgery.  -MULTILEVEL THORACOLUMBAR DEGENERATIVE DISEASE WITH SPINAL STENOSIS: This condition means that you have wear and tear in your spine, causing narrowing of the spinal canal and leading to back pain and other symptoms. You will need lumbar surgery and pelvic fusion. We will coordinate with your neurosurgeon and vascular surgeon to plan the surgery and ensure you are cleared for the procedure.  INSTRUCTIONS:  Please complete the lung function test and chest CT scan as soon as possible. Schedule a follow-up appointment after these tests are done. We will also coordinate with your surgical team to update you on the surgery scheduling.

## 2024-03-12 NOTE — Progress Notes (Signed)
 Bradley Fisher    [id number]    [birth year]  Primary Care Physician:Goldman, Johnathan Hausen, PA-C  Referring Physician: Jamey Reas, PA-C [address], [place],  [place] [address]  Chief complaint:  Follow-up for for dyspnea, hypoxia Preop evaluation before lumbar surgery  HPI: 58 year old with history of asthma, hyperlipidemia, hypertension Complains of mild dyspnea on exertion for the past few years.  At his primary care he was noted to have low oxygen levels and got referred here for evaluation  Denies any cough, sputum production, congestion, fevers, chills  Pets: No pets Occupation: On disability Exposures: Has been exposure to mold from the basement in 2022.  He lived in this house for a year and moved out in December 2022.  No ongoing exposures.  No hot tub, Jacuzzi or feather pillows or comforters Smoking history: Occasional smoker Travel history: No significant travel history Relevant family history: No family history of lung disease  Interim history: Discussed the use of AI scribe software for clinical note transcription with the patient, who gave verbal consent to proceed.  History of Present Illness Bradley Fisher is a 58 year old male who presents for preoperative evaluation for back surgery. He is accompanied by Burna Mortimer, who is outside in the truck.  He has a history of shortness of breath, initially evaluated in 2023, but did not complete the CT scan and lung function test that were ordered. He experiences shortness of breath with exercise and uses an albuterol inhaler infrequently, stating 'maybe not, not too much.'  He denies a significant smoking history, stating he only smoked 'one or two cigarettes' a long time ago and primarily uses smokeless tobacco ('dip'). He experienced mold exposure in 2022 but has since moved from that environment. No significant lung issues have been noted, and he is awaiting the scheduling of a lung function test and  CT scan.  He is scheduled to undergo back surgery for multi-level thoracolumbar degenerative disease with spinal stenosis, requiring lumbar surgery and pelvic fusion. He reports that the 'gap is all closed up,' indicating significant spinal issues. He has seen Dr. Maisie Fus, neurosurgeon and Dr. Chestine Spore, vascular surgeon for this condition in preparation for procedure    Outpatient Encounter Medications as of 03/12/2024  Medication Sig   Albuterol Sulfate (PROAIR RESPICLICK) 108 (90 Base) MCG/ACT AEPB INHALE 2 PUFFS EVERY 6 HOURS AS NEEDED FOR WHEEZING OR SHORTNESS OF BREATH   amLODipine (NORVASC) 5 MG tablet Take 1 tablet by mouth daily.   amlodipine-olmesartan (AZOR) 10-20 MG tablet Take 1 tablet by mouth daily.   atorvastatin (LIPITOR) 10 MG tablet TAKE 1 TABLET DAILY   celecoxib (CELEBREX) 200 MG capsule Take 200 mg by mouth daily.   cetirizine (ZYRTEC) 10 MG tablet Take 1 tablet (10 mg total) by mouth daily.   clotrimazole-betamethasone (LOTRISONE) cream Apply topically 2 (two) times daily.   esomeprazole (NEXIUM) 40 MG capsule Take 1 capsule by mouth daily.   fluticasone (FLONASE) 50 MCG/ACT nasal spray Place 2 sprays into both nostrils daily.   gabapentin (NEURONTIN) 400 MG capsule Take 1 capsule (400 mg total) by mouth at bedtime. For PAIN (Patient taking differently: Take 400 mg by mouth 2 (two) times daily. For PAIN)   HYDROcodone-acetaminophen (NORCO) 10-325 MG tablet Take 1 tablet by mouth 2 (two) times daily as needed for severe pain.   lisinopril (ZESTRIL) 20 MG tablet Take 1 tablet by mouth daily.   meloxicam (MOBIC) 15 MG tablet Take  1 tablet (15 mg total) by mouth daily.   methocarbamol (ROBAXIN) 750 MG tablet Take 750 mg by mouth 4 (four) times daily.   naloxone (NARCAN) nasal spray 4 mg/0.1 mL Place 1 spray into the nose.   PROAIR HFA 108 (90 Base) MCG/ACT inhaler Inhale 2 puffs into the lungs every 6 (six) hours as needed for wheezing or shortness of breath.   RESTASIS 0.05 %  ophthalmic emulsion 1 drop 2 (two) times daily.   rosuvastatin (CRESTOR) 5 MG tablet Take 5 mg by mouth daily.   sertraline (ZOLOFT) 100 MG tablet Take 1 tablet by mouth daily.   No facility-administered encounter medications on file as of 03/12/2024.   Physical Exam: Blood pressure (!) 147/88, pulse 62, height 6' (1.829 m), weight 209 lb 12.8 oz (95.2 kg), SpO2 99%. Gen:      No acute distress HEENT:  EOMI, sclera anicteric Neck:     No masses; no thyromegaly Lungs:    Clear to auscultation bilaterally; normal respiratory effort CV:         Regular rate and rhythm; no murmurs Abd:      + bowel sounds; soft, non-tender; no palpable masses, no distension Ext:    No edema; adequate peripheral perfusion Skin:      Warm and dry; no rash Neuro: alert and oriented x 3 Psych: normal mood and affect   Data Reviewed: Imaging:  PFTs:  Labs:  Assessment & Plan Chronic shortness of breath Chronic dyspnea since at least 2023, with a history of mold exposure in 2022. Minimal smoking history and no significant lung issues suspected. Requires evaluation for surgical clearance.  - Order lung function test - Order chest CT scan - Perform walk test to assess oxygen levels  Pulmonary evaluation for back surgery Pulmonary evaluation needed for upcoming back surgery to ensure no significant respiratory issues.  He did not desat on exertion today and O2 sats remained at 100% on walking.  He is likely low risk for any perioperative pulmonary complication.  There are no obvious contraindications for surgery. CT scan and lung function test pending.  Multilevel thoracolumbar degenerative disease with spinal stenosis Requires lumbar surgery and pelvic fusion due to spinal stenosis and degenerative disease. Surgery will involve an anterior approach with assistance from a vascular surgeon.   Follow-up Follow-up required to ensure completion of diagnostic tests and surgical planning. - Schedule follow-up  appointment after completion of lung function test and CT scan   Plan/Recommendations: High-res CT, PFTs  Chilton Greathouse MD [facility] 03/12/2024, 9:50 AM  CC: Jamey Reas, P*

## 2024-03-15 ENCOUNTER — Ambulatory Visit
Admission: RE | Admit: 2024-03-15 | Discharge: 2024-03-15 | Discharge status: Home / Self Care | Source: Ambulatory Visit | Attending: Pulmonary Disease

## 2024-03-15 DIAGNOSIS — R0600 Dyspnea, unspecified: Secondary | ICD-10-CM

## 2024-03-19 ENCOUNTER — Other Ambulatory Visit: Payer: Self-pay | Admitting: Neurosurgery

## 2024-03-20 ENCOUNTER — Other Ambulatory Visit: Payer: Self-pay

## 2024-04-02 NOTE — Pre-Procedure Instructions (Signed)
 Surgical Instructions   Your procedure is scheduled on Wednesday, April 30th. Report to [facility] Main Entrance "A" at 05:30 A.M., then check in with the Admitting office. Any questions or running late day of surgery: call [phone]  Questions prior to your surgery date: call [phone], Monday-Friday, 8am-4pm. If you experience any cold or flu symptoms such as cough, fever, chills, shortness of breath, etc. between now and your scheduled surgery, please notify us  at the above number.     Remember:  Do not eat or drink after midnight the night before your surgery    Take these medicines the morning of surgery with A SIP OF WATER  esomeprazole  (NEXIUM )  gabapentin  (NEURONTIN )  rosuvastatin (CRESTOR)    May take these medicines IF NEEDED: HYDROcodone -acetaminophen  (NORCO)  PROAIR  HFA inhaler- bring with you on day of surgery  One week prior to surgery, STOP taking any Aspirin (unless otherwise instructed by your surgeon) Aleve, Naproxen, Ibuprofen, Motrin, Advil, Goody's, BC's, all herbal medications, fish oil, and non-prescription vitamins.                     Do NOT Smoke (Tobacco/Vaping) for 24 hours prior to your procedure.  If you use a CPAP at night, you may bring your mask/headgear for your overnight stay.   You will be asked to remove any contacts, glasses, piercing's, hearing aid's, dentures/partials prior to surgery. Please bring cases for these items if needed.    Patients discharged the day of surgery will not be allowed to drive home, and someone needs to stay with them for 24 hours.  SURGICAL WAITING ROOM VISITATION Patients may have no more than 2 support people in the waiting area - these visitors may rotate.   Pre-op nurse will coordinate an appropriate time for 1 ADULT support person, who may not rotate, to accompany patient in pre-op.  Children under the age of 78 must have an adult with them who is not the patient and must remain in the main waiting area  with an adult.  If the patient needs to stay at the hospital during part of their recovery, the visitor guidelines for inpatient rooms apply.  Please refer to the [facility] website for the visitor guidelines for any additional information.   If you received a COVID test during your pre-op visit  it is requested that you wear a mask when out in public, stay away from anyone that may not be feeling well and notify your surgeon if you develop symptoms. If you have been in contact with anyone that has tested positive in the last 10 days please notify you surgeon.      Pre-operative 5 CHG Bathing Instructions   You can play a key role in reducing the risk of infection after surgery. Your skin needs to be as free of germs as possible. You can reduce the number of germs on your skin by washing with CHG (chlorhexidine  gluconate) soap before surgery. CHG is an antiseptic soap that kills germs and continues to kill germs even after washing.   DO NOT use if you have an allergy to chlorhexidine /CHG or antibacterial soaps. If your skin becomes reddened or irritated, stop using the CHG and notify one of our RNs at [phone].   Please shower with the CHG soap starting 4 days before surgery using the following schedule:     Please keep in mind the following:  DO NOT shave, including legs and underarms, starting the day of your first  shower.   You may shave your face at any point before/day of surgery.  Place clean sheets on your bed the day you start using CHG soap. Use a clean washcloth (not used since being washed) for each shower. DO NOT sleep with pets once you start using the CHG.   CHG Shower Instructions:  Wash your face and private area with normal soap. If you choose to wash your hair, wash first with your normal shampoo.  After you use shampoo/soap, rinse your hair and body thoroughly to remove shampoo/soap residue.  Turn the water OFF and apply about 3 tablespoons (45 ml) of CHG soap  to a CLEAN washcloth.  Apply CHG soap ONLY FROM YOUR NECK DOWN TO YOUR TOES (washing for 3-5 minutes)  DO NOT use CHG soap on face, private areas, open wounds, or sores.  Pay special attention to the area where your surgery is being performed.  If you are having back surgery, having someone wash your back for you may be helpful. Wait 2 minutes after CHG soap is applied, then you may rinse off the CHG soap.  Pat dry with a clean towel  Put on clean clothes/pajamas   If you choose to wear lotion, please use ONLY the CHG-compatible lotions that are listed below.  Additional instructions for the day of surgery: DO NOT APPLY any lotions, deodorants, cologne, or perfumes.   Do not bring valuables to the hospital. [facility] is not responsible for any belongings/valuables. Do not wear nail polish, gel polish, artificial nails, or any other type of covering on natural nails (fingers and toes) Do not wear jewelry or makeup Put on clean/comfortable clothes.  Please brush your teeth.  Ask your nurse before applying any prescription medications to the skin.     CHG Compatible Lotions   Aveeno Moisturizing lotion  Cetaphil Moisturizing Cream  Cetaphil Moisturizing Lotion  Clairol Herbal Essence Moisturizing Lotion, Dry Skin  Clairol Herbal Essence Moisturizing Lotion, Extra Dry Skin  Clairol Herbal Essence Moisturizing Lotion, Normal Skin  Curel Age Defying Therapeutic Moisturizing Lotion with Alpha Hydroxy  Curel Extreme Care Body Lotion  Curel Soothing Hands Moisturizing Hand Lotion  Curel Therapeutic Moisturizing Cream, Fragrance-Free  Curel Therapeutic Moisturizing Lotion, Fragrance-Free  Curel Therapeutic Moisturizing Lotion, Original Formula  Eucerin Daily Replenishing Lotion  Eucerin Dry Skin Therapy Plus Alpha Hydroxy Crme  Eucerin Dry Skin Therapy Plus Alpha Hydroxy Lotion  Eucerin Original Crme  Eucerin Original Lotion  Eucerin Plus Crme Eucerin Plus Lotion  Eucerin  TriLipid Replenishing Lotion  Keri Anti-Bacterial Hand Lotion  Keri Deep Conditioning Original Lotion Dry Skin Formula Softly Scented  Keri Deep Conditioning Original Lotion, Fragrance Free Sensitive Skin Formula  Keri Lotion Fast Absorbing Fragrance Free Sensitive Skin Formula  Keri Lotion Fast Absorbing Softly Scented Dry Skin Formula  Keri Original Lotion  Keri Skin Renewal Lotion Keri Silky Smooth Lotion  Keri Silky Smooth Sensitive Skin Lotion  Nivea Body Creamy Conditioning Oil  Nivea Body Extra Enriched Lotion  Nivea Body Original Lotion  Nivea Body Sheer Moisturizing Lotion Nivea Crme  Nivea Skin Firming Lotion  NutraDerm 30 Skin Lotion  NutraDerm Skin Lotion  NutraDerm Therapeutic Skin Cream  NutraDerm Therapeutic Skin Lotion  ProShield Protective Hand Cream  Provon moisturizing lotion  Please read over the following fact sheets that you were given.

## 2024-04-03 ENCOUNTER — Encounter (HOSPITAL_COMMUNITY): Payer: Self-pay

## 2024-04-03 ENCOUNTER — Encounter (HOSPITAL_COMMUNITY)
Admission: RE | Admit: 2024-04-03 | Discharge: 2024-04-03 | Disposition: A | Discharge status: Home / Self Care | Source: Ambulatory Visit | Attending: Neurosurgery | Admitting: Neurosurgery

## 2024-04-03 ENCOUNTER — Other Ambulatory Visit: Payer: Self-pay

## 2024-04-03 VITALS — BP 139/91 | HR 61 | Temp 97.9°F | Resp 17 | Ht 72.0 in | Wt 205.8 lb

## 2024-04-03 DIAGNOSIS — I10 Essential (primary) hypertension: Secondary | ICD-10-CM | POA: Diagnosis not present

## 2024-04-03 DIAGNOSIS — J45909 Unspecified asthma, uncomplicated: Secondary | ICD-10-CM | POA: Insufficient documentation

## 2024-04-03 DIAGNOSIS — E785 Hyperlipidemia, unspecified: Secondary | ICD-10-CM | POA: Insufficient documentation

## 2024-04-03 DIAGNOSIS — Z01818 Encounter for other preprocedural examination: Secondary | ICD-10-CM | POA: Insufficient documentation

## 2024-04-03 HISTORY — DX: Chronic obstructive pulmonary disease, unspecified: J44.9

## 2024-04-03 LAB — CBC
HCT: 43.5 % (ref 39.0–52.0)
Hemoglobin: 15.1 g/dL (ref 13.0–17.0)
MCH: 31.3 pg (ref 26.0–34.0)
MCHC: 34.7 g/dL (ref 30.0–36.0)
MCV: 90.1 fL (ref 80.0–100.0)
Platelets: 211 10*3/uL (ref 150–400)
RBC: 4.83 MIL/uL (ref 4.22–5.81)
RDW: 12.6 % (ref 11.5–15.5)
WBC: 7 10*3/uL (ref 4.0–10.5)
nRBC: 0 % (ref 0.0–0.2)

## 2024-04-03 LAB — SURGICAL PCR SCREEN
MRSA, PCR: NEGATIVE
Staphylococcus aureus: NEGATIVE

## 2024-04-03 LAB — BASIC METABOLIC PANEL WITH GFR
Anion gap: 8 (ref 5–15)
BUN: 11 mg/dL (ref 6–20)
CO2: 30 mmol/L (ref 22–32)
Calcium: 9.8 mg/dL (ref 8.9–10.3)
Chloride: 101 mmol/L (ref 98–111)
Creatinine, Ser: 0.81 mg/dL (ref 0.61–1.24)
GFR, Estimated: >60 mL/min (ref >60)
Glucose, Bld: 83 mg/dL (ref 70–99)
Potassium: 4.8 mmol/L (ref 3.5–5.1)
Sodium: 139 mmol/L (ref 135–145)

## 2024-04-03 NOTE — Progress Notes (Signed)
 PCP - Chares Commons, PA-C  Cardiologist -   PPM/ICD -  Device Orders -  Rep Notified -   Chest x-ray -  EKG - 04-03-24 Stress Test - "had one a long time ago" ECHO -  Cardiac Cath -   Sleep Study - denies CPAP - n/a  DM -denies  Blood Thinner Instructions:denies Aspirin Instructions:n/a  ERAS Protcol -NPO   COVID TEST- n./a   Anesthesia review: yes  Patient denies shortness of breath, fever, cough and chest pain at PAT appointment   All instructions explained to the patient, with a verbal understanding of the material. Patient agrees to go over the instructions while at home for a better understanding. Patient also instructed to self quarantine after being tested for COVID-19. The opportunity to ask questions was provided.

## 2024-04-04 ENCOUNTER — Encounter (HOSPITAL_BASED_OUTPATIENT_CLINIC_OR_DEPARTMENT_OTHER)

## 2024-04-04 ENCOUNTER — Encounter (HOSPITAL_BASED_OUTPATIENT_CLINIC_OR_DEPARTMENT_OTHER): Payer: Self-pay | Admitting: Pulmonary Disease

## 2024-04-04 NOTE — Progress Notes (Signed)
 Anesthesia Chart Review:  58 year old male with pertinent history including HTN, HLD, GERD on PPI, asthma, remote history of seizures (off meds since 2019 with no recurrence per notes).  He was referred by his PCP to pulmonology for evaluation of chronic DOE and preoperative evaluation.  Seen by Dr. Waylan Haggard on 03/12/2024.  Per note, "Pulmonary evaluation needed for upcoming back surgery to ensure no significant respiratory issues. He did not desat on exertion today and O2 sats remained at 100% on walking.  He is likely low risk for any perioperative pulmonary complication.  There are no obvious contraindications for surgery."  Subsequent HRCT chest 03/15/2024 showed mild peripheral and basilar predominant groundglass and subpleural reticular densities which were felt possibly due to nonspecific interstitial pneumonitis or usual interstitial pneumonitis, findings ultimately indeterminate.  PFTs were also ordered but have not yet been completed.  Preop labs reviewed, WNL.  EKG 04/03/2024: Sinus bradycardia.  Rate 56.  HRCT chest 03/15/2024: IMPRESSION: Mild peripheral and basilar predominant ground-glass and subpleural reticular densities may be due to nonspecific interstitial pneumonitis or usual interstitial pneumonitis. Findings are indeterminate for UIP per consensus guidelines: Diagnosis of Idiopathic Pulmonary Fibrosis: An Official ATS/ERS/JRS/ALAT Clinical Practice Guideline. Am Annie Barton Crit Care Med Vol 198, Iss 5, [id number], Aug 13 2017.    Edilia Gordon [facility] Short Stay Center/Anesthesiology Phone [phone] 04/05/2024 9:24 AM

## 2024-04-05 NOTE — Anesthesia Preprocedure Evaluation (Addendum)
 Anesthesia Evaluation  Patient identified by MRN, date of birth, ID band Patient awake    Reviewed: Allergy & Precautions, NPO status , Patient's Chart, lab work & pertinent test results  Airway Mallampati: II  TM Distance: >3 FB Neck ROM: Full    Dental  (+) Edentulous Upper, Edentulous Lower   Pulmonary asthma , COPD, Current Smoker   Pulmonary exam normal        Cardiovascular hypertension, Pt. on medications  Rhythm:Regular Rate:Normal     Neuro/Psych Seizures -, Well Controlled,    Depression       GI/Hepatic Neg liver ROS,GERD  Medicated,,  Endo/Other    Renal/GU negative Renal ROS  negative genitourinary   Musculoskeletal  (+) Arthritis , Osteoarthritis,    Abdominal Normal abdominal exam  (+)   Peds  Hematology Lab Results      Component                Value               Date                      WBC                      7.0                 04/03/2024                HGB                      15.1                04/03/2024                HCT                      43.5                04/03/2024                MCV                      90.1                04/03/2024                PLT                      211                 04/03/2024             Lab Results      Component                Value               Date                      NA                       139                 04/03/2024                K  4.8                 04/03/2024                CO2                      30                  04/03/2024                GLUCOSE                  83                  04/03/2024                BUN                      11                  04/03/2024                CREATININE               0.81                04/03/2024                CALCIUM                   9.8                 04/03/2024                GFRNONAA                 >60                 04/03/2024              Anesthesia Other  Findings   Reproductive/Obstetrics                             Anesthesia Physical Anesthesia Plan  ASA: 3  Anesthesia Plan: General   Post-op Pain Management: Tylenol  PO (pre-op)*, Celebrex  PO (pre-op)* and Ketamine IV*   Induction: Intravenous  PONV Risk Score and Plan: 1 and Ondansetron, Dexamethasone , Midazolam  and Treatment may vary due to age or medical condition  Airway Management Planned: Mask, Oral ETT and Video Laryngoscope Planned  Additional Equipment: Arterial line  Intra-op Plan:   Post-operative Plan: Extubation in OR  Informed Consent: I have reviewed the patients History and Physical, chart, labs and discussed the procedure including the risks, benefits and alternatives for the proposed anesthesia with the patient or authorized representative who has indicated his/her understanding and acceptance.     Dental advisory given  Plan Discussed with: CRNA  Anesthesia Plan Comments: (PAT note by Rudy Costain, PA-C: 58 year old male with pertinent history including HTN, HLD, GERD on PPI, asthma, remote history of seizures (off meds since 2019 with no recurrence per notes).  He was referred by his PCP to pulmonology for evaluation of chronic DOE and preoperative evaluation.  Seen by Dr. Waylan Haggard on 03/12/2024.  Per note, "Pulmonary evaluation needed for upcoming back surgery to ensure no significant respiratory issues. He did not desat on exertion today and O2 sats remained at 100% on walking.  He is likely  low risk for any perioperative pulmonary complication.  There are no obvious contraindications for surgery."  Subsequent HRCT chest 03/15/2024 showed mild peripheral and basilar predominant groundglass and subpleural reticular densities which were felt possibly due to nonspecific interstitial pneumonitis or usual interstitial pneumonitis, findings ultimately indeterminate.  PFTs were also ordered but have not yet been completed.  Preop labs reviewed,  WNL.  EKG 04/03/2024: Sinus bradycardia.  Rate 56.  HRCT chest 03/15/2024: IMPRESSION: Mild peripheral and basilar predominant ground-glass and subpleural reticular densities may be due to nonspecific interstitial pneumonitis or usual interstitial pneumonitis. Findings are indeterminate for UIP per consensus guidelines: Diagnosis of Idiopathic Pulmonary Fibrosis: An Official ATS/ERS/JRS/ALAT Clinical Practice Guideline. Ronaldo Cocker Crit Care Med Vol 198, Iss 5, [id number], Aug 13 2017.  )        Anesthesia Quick Evaluation

## 2024-04-10 NOTE — H&P (Signed)
 CC: back and leg pain  HPI:    s/p L2-5 MIS decompression with Dr. Ali Antonio in 2021. Did well after surgery, but over past year has had increased lower back pain. He tried PT but had to stop since it was making his symptoms worsening. Pain is in lower back, worse with walking. he was diagnosed with collapsed discs. He also has left leg sciatica, worse at night. it goes down to the ankle, worsening over the past year.  ESIs x 2 failed to improve his symptoms.    Patient Active Problem List   Diagnosis Date Noted   Scoliosis deformity of spine 03/02/2024   Abscess 05/20/2021   Tobacco use 01/22/2021   Gastroesophageal reflux disease without esophagitis 08/17/2017   Chronic seasonal allergic rhinitis due to pollen 03/09/2017   Depression, recurrent (HCC) 02/11/2017   Generalized OA 10/27/2016   Chronic midline low back pain without sciatica 10/27/2016   Hypertension, essential 10/27/2016   History of seizures 10/27/2016   Past Medical History:  Diagnosis Date   Arthritis    Asthma    COPD (chronic obstructive pulmonary disease) (HCC)    Depression    GERD (gastroesophageal reflux disease)    Hyperlipidemia    Hypertension    Seizures (HCC)    unknow etiology; on meds for this; no seizures since on meds 10 years ago    Past Surgical History:  Procedure Laterality Date   COLONOSCOPY WITH PROPOFOL  N/A 05/29/2018   Procedure: COLONOSCOPY WITH PROPOFOL ;  Surgeon: Suzette Espy, MD;  Location: [facility];  Service: Endoscopy;  Laterality: N/A;  9:45am   KNEE ARTHROSCOPY WITH MEDIAL MENISECTOMY Left 11/11/2016   Procedure: LEFT KNEE ARTHROSCOPY WITH MEDIAL MENISECTOMY;  Surgeon: Darrin Emerald, MD;  Location: [facility];  Service: Orthopedics;  Laterality: Left;   Thumb surgery-left  02/25/2020    No medications prior to admission.   No Known Allergies  Social History   Tobacco Use   Smoking status: Some Days    Types: Cigars    Start date: 01/22/1973    Passive exposure:  Past   Smokeless tobacco: Current    Types: Snuff   Tobacco comments:    Smokes 1 time a month/03/03/22  Substance Use Topics   Alcohol use: Yes    Alcohol/week: 12.0 standard drinks of alcohol    Types: 12 Cans of beer per week    Comment: drinks some weekends.     Family History  Problem Relation Age of Onset   Hypertension Mother    Diabetes Mother    Hypertension Father    Diabetes Father    Diabetes Brother    Colon cancer Neg Hx      Review of Systems Pertinent items are noted in HPI.  Objective:   No data found. No intake/output data recorded. No intake/output data recorded.      General : Alert, cooperative, no distress, appears stated age   Head:  Normocephalic/atraumatic    Eyes: PERRL, conjunctiva/corneas clear, EOM's intact. Fundi could not be visualized Neck: Supple Chest:  Respirations unlabored Chest wall: no tenderness or deformity Heart: Regular rate and rhythm Abdomen: Soft, nontender and nondistended Extremities: warm and well-perfused Skin: normal turgor, color and texture Neurologic:  Alert, oriented x 3.  Eyes open spontaneously. PERRL, EOMI, VFC, no facial droop. V1-3 intact.  No dysarthria, tongue protrusion symmetric.  CNII-XII intact. + SLR bilaterally.  Full strength in legs.  Well-healed back incisions.       Data ReviewCBC:  Lab Results  Component Value Date   WBC 7.0 04/03/2024   RBC 4.83 04/03/2024   BMP:  Lab Results  Component Value Date   GLUCOSE 83 04/03/2024   CO2 30 04/03/2024   BUN 11 04/03/2024   BUN 14 01/22/2021   CREATININE 0.81 04/03/2024   CALCIUM  9.8 04/03/2024   Radiology review:   See clinic note for details.  Moderate-severe scoliosis, moderate sagittal imbalance (SVA 7.5 cm), multilevel lateral recess and foraminal stenosis   Assessment:   Active Problems:   * No active hospital problems. *  Severe degenerative spine disease with degenerative scoliosis, stenosis, hx of posterior  lumbar  decompressions who has worsening mechanical back pain and radiculopathy refractory to medical, physical and injection therapy  Plan:   - stage 1 L4-S1 ALIF, L1-4 DLIF today - Risks, benefits, alternatives, and expected convalescence were discussed with her.  Risks discussed included, but were not limited to bleeding, pain, infection, scar, spinal fluid leak, neurologic deficit, instability, pseudoarthrosis, damage to nearby organs, and death.  Informed consent was obtained.

## 2024-04-11 ENCOUNTER — Inpatient Hospital Stay (HOSPITAL_COMMUNITY)

## 2024-04-11 ENCOUNTER — Inpatient Hospital Stay (HOSPITAL_COMMUNITY): Payer: Self-pay | Admitting: Physician Assistant

## 2024-04-11 ENCOUNTER — Other Ambulatory Visit: Payer: Self-pay

## 2024-04-11 ENCOUNTER — Inpatient Hospital Stay (HOSPITAL_COMMUNITY): Admitting: Registered Nurse

## 2024-04-11 ENCOUNTER — Inpatient Hospital Stay (HOSPITAL_COMMUNITY)
Admission: RE | Admit: 2024-04-11 | Discharge: 2024-04-23 | DRG: 427 | Disposition: A | Discharge status: Rehabilitation Facility | Source: Ambulatory Visit | Attending: Vascular Surgery | Admitting: Vascular Surgery

## 2024-04-11 ENCOUNTER — Encounter (HOSPITAL_COMMUNITY): Payer: Self-pay

## 2024-04-11 ENCOUNTER — Encounter (HOSPITAL_COMMUNITY)
Admission: RE | Disposition: A | Discharge status: Rehabilitation Facility | Payer: Self-pay | Source: Ambulatory Visit | Attending: Neurosurgery

## 2024-04-11 DIAGNOSIS — M4804 Spinal stenosis, thoracic region: Secondary | ICD-10-CM | POA: Diagnosis present

## 2024-04-11 DIAGNOSIS — Z833 Family history of diabetes mellitus: Secondary | ICD-10-CM | POA: Diagnosis not present

## 2024-04-11 DIAGNOSIS — F17290 Nicotine dependence, other tobacco product, uncomplicated: Secondary | ICD-10-CM | POA: Diagnosis present

## 2024-04-11 DIAGNOSIS — Z79899 Other long term (current) drug therapy: Secondary | ICD-10-CM

## 2024-04-11 DIAGNOSIS — M2578 Osteophyte, vertebrae: Secondary | ICD-10-CM | POA: Diagnosis present

## 2024-04-11 DIAGNOSIS — M545 Low back pain, unspecified: Secondary | ICD-10-CM | POA: Diagnosis not present

## 2024-04-11 DIAGNOSIS — M48061 Spinal stenosis, lumbar region without neurogenic claudication: Secondary | ICD-10-CM | POA: Diagnosis present

## 2024-04-11 DIAGNOSIS — Z8249 Family history of ischemic heart disease and other diseases of the circulatory system: Secondary | ICD-10-CM

## 2024-04-11 DIAGNOSIS — M4185 Other forms of scoliosis, thoracolumbar region: Principal | ICD-10-CM | POA: Diagnosis present

## 2024-04-11 DIAGNOSIS — M4156 Other secondary scoliosis, lumbar region: Secondary | ICD-10-CM | POA: Diagnosis not present

## 2024-04-11 DIAGNOSIS — F17210 Nicotine dependence, cigarettes, uncomplicated: Secondary | ICD-10-CM

## 2024-04-11 DIAGNOSIS — F1011 Alcohol abuse, in remission: Secondary | ICD-10-CM | POA: Diagnosis not present

## 2024-04-11 DIAGNOSIS — Z751 Person awaiting admission to adequate facility elsewhere: Secondary | ICD-10-CM

## 2024-04-11 DIAGNOSIS — K219 Gastro-esophageal reflux disease without esophagitis: Secondary | ICD-10-CM | POA: Diagnosis present

## 2024-04-11 DIAGNOSIS — J449 Chronic obstructive pulmonary disease, unspecified: Secondary | ICD-10-CM

## 2024-04-11 DIAGNOSIS — Z01818 Encounter for other preprocedural examination: Secondary | ICD-10-CM

## 2024-04-11 DIAGNOSIS — G8918 Other acute postprocedural pain: Secondary | ICD-10-CM | POA: Diagnosis not present

## 2024-04-11 DIAGNOSIS — Z791 Long term (current) use of non-steroidal anti-inflammatories (NSAID): Secondary | ICD-10-CM | POA: Diagnosis not present

## 2024-04-11 DIAGNOSIS — E785 Hyperlipidemia, unspecified: Secondary | ICD-10-CM | POA: Diagnosis present

## 2024-04-11 DIAGNOSIS — G8929 Other chronic pain: Secondary | ICD-10-CM | POA: Diagnosis present

## 2024-04-11 DIAGNOSIS — I951 Orthostatic hypotension: Secondary | ICD-10-CM | POA: Diagnosis present

## 2024-04-11 DIAGNOSIS — R569 Unspecified convulsions: Secondary | ICD-10-CM | POA: Diagnosis not present

## 2024-04-11 DIAGNOSIS — M5416 Radiculopathy, lumbar region: Principal | ICD-10-CM | POA: Diagnosis present

## 2024-04-11 DIAGNOSIS — M4800 Spinal stenosis, site unspecified: Secondary | ICD-10-CM | POA: Diagnosis not present

## 2024-04-11 DIAGNOSIS — D62 Acute posthemorrhagic anemia: Secondary | ICD-10-CM | POA: Diagnosis not present

## 2024-04-11 DIAGNOSIS — I10 Essential (primary) hypertension: Secondary | ICD-10-CM | POA: Diagnosis present

## 2024-04-11 DIAGNOSIS — R2689 Other abnormalities of gait and mobility: Secondary | ICD-10-CM | POA: Diagnosis not present

## 2024-04-11 DIAGNOSIS — K5900 Constipation, unspecified: Secondary | ICD-10-CM | POA: Diagnosis not present

## 2024-04-11 HISTORY — PX: ABDOMINAL EXPOSURE: SHX5708

## 2024-04-11 HISTORY — PX: ANTERIOR LUMBAR FUSION: SHX1170

## 2024-04-11 HISTORY — PX: ANTERIOR LAT LUMBAR FUSION: SHX1168

## 2024-04-11 LAB — POCT I-STAT 7, (LYTES, BLD GAS, ICA,H+H)
Acid-Base Excess: 0 mmol/L (ref 0.0–2.0)
Acid-base deficit: 2 mmol/L (ref 0.0–2.0)
Bicarbonate: 23.6 mmol/L (ref 20.0–28.0)
Bicarbonate: 25 mmol/L (ref 20.0–28.0)
Calcium, Ion: 1.22 mmol/L (ref 1.15–1.40)
Calcium, Ion: 1.2 mmol/L (ref 1.15–1.40)
HCT: 32 % — ABNORMAL LOW (ref 39.0–52.0)
HCT: 31 % — ABNORMAL LOW (ref 39.0–52.0)
Hemoglobin: 10.9 g/dL — ABNORMAL LOW (ref 13.0–17.0)
Hemoglobin: 10.5 g/dL — ABNORMAL LOW (ref 13.0–17.0)
O2 Saturation: 100 %
O2 Saturation: 100 %
Patient temperature: 35.4
Potassium: 4.3 mmol/L (ref 3.5–5.1)
Potassium: 4.6 mmol/L (ref 3.5–5.1)
Sodium: 134 mmol/L — ABNORMAL LOW (ref 135–145)
Sodium: 135 mmol/L (ref 135–145)
TCO2: 25 mmol/L (ref 22–32)
TCO2: 26 mmol/L (ref 22–32)
pCO2 arterial: 38 mmHg (ref 32–48)
pCO2 arterial: 41.8 mmHg (ref 32–48)
pH, Arterial: 7.394 (ref 7.35–7.45)
pH, Arterial: 7.385 (ref 7.35–7.45)
pO2, Arterial: 216 mmHg — ABNORMAL HIGH (ref 83–108)
pO2, Arterial: 210 mmHg — ABNORMAL HIGH (ref 83–108)

## 2024-04-11 LAB — ABO/RH: ABO/RH(D): O POS

## 2024-04-11 LAB — PREPARE RBC (CROSSMATCH)

## 2024-04-11 SURGERY — ANTERIOR LUMBAR FUSION 2 LEVELS
Anesthesia: General | Site: Flank | Laterality: Right

## 2024-04-11 MED ORDER — ONDANSETRON HCL 4 MG/2ML IJ SOLN
INTRAMUSCULAR | Status: DC | PRN
  Administered 2024-04-11: 4 mg via INTRAVENOUS

## 2024-04-11 MED ORDER — LACTATED RINGERS IV SOLN: INTRAVENOUS | Status: DC | PRN

## 2024-04-11 MED ORDER — VASOPRESSIN 20 UNIT/ML IV SOLN
INTRAVENOUS | Status: AC
  Filled 2024-04-11: qty 1

## 2024-04-11 MED ORDER — PROPOFOL 1000 MG/100ML IV EMUL
INTRAVENOUS | Status: AC
Start: 2024-04-11 — End: ?
  Filled 2024-04-11: qty 300

## 2024-04-11 MED ORDER — ORAL CARE MOUTH RINSE: 15.0000 mL | Freq: Once | OROMUCOSAL | Status: AC

## 2024-04-11 MED ORDER — THROMBIN 5000 UNITS EX SOLR
OROMUCOSAL | Status: DC | PRN
  Administered 2024-04-11: 5 mL via TOPICAL

## 2024-04-11 MED ORDER — HYDROMORPHONE HCL 1 MG/ML IJ SOLN
INTRAMUSCULAR | Status: AC
  Filled 2024-04-11: qty 0.5

## 2024-04-11 MED ORDER — KETAMINE HCL 10 MG/ML IJ SOLN
INTRAMUSCULAR | Status: DC | PRN
Start: 2024-04-11 — End: 2024-04-11
  Administered 2024-04-11: 20 mg via INTRAVENOUS
  Administered 2024-04-11 (×3): 10 mg via INTRAVENOUS

## 2024-04-11 MED ORDER — PANTOPRAZOLE SODIUM 40 MG PO TBEC
80.0000 mg | DELAYED_RELEASE_TABLET | Freq: Every day | ORAL | Status: DC
  Administered 2024-04-12 – 2024-04-20 (×8): 80 mg via ORAL
  Filled 2024-04-11 (×10): qty 2

## 2024-04-11 MED ORDER — POLYETHYLENE GLYCOL 3350 17 G PO PACK
17.0000 g | PACK | Freq: Every day | ORAL | Status: DC | PRN
  Administered 2024-04-14 – 2024-04-22 (×4): 17 g via ORAL
  Filled 2024-04-11 (×4): qty 1

## 2024-04-11 MED ORDER — FLEET ENEMA RE ENEM
1.0000 | ENEMA | Freq: Once | RECTAL | Status: DC | PRN
  Filled 2024-04-11: qty 1

## 2024-04-11 MED ORDER — ONDANSETRON HCL 4 MG PO TABS: 4.0000 mg | ORAL_TABLET | Freq: Four times a day (QID) | ORAL | Status: DC | PRN

## 2024-04-11 MED ORDER — MENTHOL 3 MG MT LOZG: 1.0000 | LOZENGE | OROMUCOSAL | Status: DC | PRN

## 2024-04-11 MED ORDER — LIDOCAINE-EPINEPHRINE 1 %-1:100000 IJ SOLN
INTRAMUSCULAR | Status: DC | PRN
  Administered 2024-04-11: 10 mL

## 2024-04-11 MED ORDER — ACETAMINOPHEN 500 MG PO TABS
ORAL_TABLET | ORAL | Status: AC
  Administered 2024-04-11: 1000 mg via ORAL
  Filled 2024-04-11: qty 2

## 2024-04-11 MED ORDER — ROSUVASTATIN CALCIUM 5 MG PO TABS
5.0000 mg | ORAL_TABLET | Freq: Every day | ORAL | Status: DC
  Administered 2024-04-11 – 2024-04-23 (×13): 5 mg via ORAL
  Filled 2024-04-11 (×13): qty 1

## 2024-04-11 MED ORDER — METHOCARBAMOL 500 MG PO TABS
500.0000 mg | ORAL_TABLET | Freq: Four times a day (QID) | ORAL | Status: DC | PRN
  Administered 2024-04-12 – 2024-04-23 (×16): 500 mg via ORAL
  Filled 2024-04-11 (×17): qty 1

## 2024-04-11 MED ORDER — EPHEDRINE SULFATE-NACL 50-0.9 MG/10ML-% IV SOSY
PREFILLED_SYRINGE | INTRAVENOUS | Status: DC | PRN
  Administered 2024-04-11 (×2): 5 mg via INTRAVENOUS

## 2024-04-11 MED ORDER — CEFAZOLIN SODIUM-DEXTROSE 2-4 GM/100ML-% IV SOLN
2.0000 g | INTRAVENOUS | Status: AC
Start: 2024-04-11 — End: 2024-04-11
  Administered 2024-04-11 (×2): 2 g via INTRAVENOUS

## 2024-04-11 MED ORDER — THROMBIN 5000 UNITS EX KIT
PACK | CUTANEOUS | Status: AC
  Filled 2024-04-11: qty 1

## 2024-04-11 MED ORDER — DROPERIDOL 2.5 MG/ML IJ SOLN: 0.6250 mg | Freq: Once | INTRAMUSCULAR | Status: DC | PRN

## 2024-04-11 MED ORDER — BUPIVACAINE HCL (PF) 0.5 % IJ SOLN
INTRAMUSCULAR | Status: AC
  Filled 2024-04-11: qty 30

## 2024-04-11 MED ORDER — CHLORHEXIDINE GLUCONATE CLOTH 2 % EX PADS: 6.0000 | MEDICATED_PAD | Freq: Once | CUTANEOUS | Status: DC

## 2024-04-11 MED ORDER — THROMBIN 5000 UNITS EX KIT
PACK | CUTANEOUS | Status: AC
  Filled 2024-04-11: qty 2

## 2024-04-11 MED ORDER — KETAMINE HCL 50 MG/5ML IJ SOSY
PREFILLED_SYRINGE | INTRAMUSCULAR | Status: AC
  Filled 2024-04-11: qty 5

## 2024-04-11 MED ORDER — PHENOL 1.4 % MT LIQD: 1.0000 | OROMUCOSAL | Status: DC | PRN

## 2024-04-11 MED ORDER — PROPOFOL 10 MG/ML IV BOLUS
INTRAVENOUS | Status: AC
  Filled 2024-04-11: qty 20

## 2024-04-11 MED ORDER — ACETAMINOPHEN 650 MG RE SUPP: 650.0000 mg | RECTAL | Status: DC | PRN

## 2024-04-11 MED ORDER — LIDOCAINE 2% (20 MG/ML) 5 ML SYRINGE
INTRAMUSCULAR | Status: DC | PRN
  Administered 2024-04-11: 60 mg via INTRAVENOUS

## 2024-04-11 MED ORDER — MORPHINE SULFATE (PF) 2 MG/ML IV SOLN
2.0000 mg | INTRAVENOUS | Status: DC | PRN
  Administered 2024-04-11 – 2024-04-13 (×4): 2 mg via INTRAVENOUS
  Filled 2024-04-11 (×4): qty 1

## 2024-04-11 MED ORDER — THROMBIN (RECOMBINANT) 5000 UNITS EX SOLR: CUTANEOUS | Status: DC | PRN

## 2024-04-11 MED ORDER — SODIUM CHLORIDE 0.9% FLUSH: 3.0000 mL | INTRAVENOUS | Status: DC | PRN

## 2024-04-11 MED ORDER — PROPOFOL 500 MG/50ML IV EMUL
INTRAVENOUS | Status: DC | PRN
  Administered 2024-04-11: 50 ug/kg/min via INTRAVENOUS

## 2024-04-11 MED ORDER — CEFAZOLIN SODIUM-DEXTROSE 2-4 GM/100ML-% IV SOLN
2.0000 g | Freq: Four times a day (QID) | INTRAVENOUS | Status: AC
Start: 2024-04-11 — End: 2024-04-12
  Administered 2024-04-11 – 2024-04-12 (×2): 2 g via INTRAVENOUS
  Filled 2024-04-11 (×2): qty 100

## 2024-04-11 MED ORDER — THROMBIN 5000 UNITS EX KIT
PACK | CUTANEOUS | Status: AC
Start: 2024-04-11 — End: ?
  Filled 2024-04-11: qty 1

## 2024-04-11 MED ORDER — OXYCODONE HCL 5 MG PO TABS: 5.0000 mg | ORAL_TABLET | ORAL | Status: DC | PRN

## 2024-04-11 MED ORDER — PHENYLEPHRINE HCL-NACL 20-0.9 MG/250ML-% IV SOLN
INTRAVENOUS | Status: AC
  Filled 2024-04-11: qty 500

## 2024-04-11 MED ORDER — ONDANSETRON HCL 4 MG/2ML IJ SOLN: 4.0000 mg | Freq: Four times a day (QID) | INTRAMUSCULAR | Status: DC | PRN

## 2024-04-11 MED ORDER — OXYCODONE HCL 5 MG PO TABS
10.0000 mg | ORAL_TABLET | ORAL | Status: DC | PRN
  Administered 2024-04-12 – 2024-04-23 (×52): 10 mg via ORAL
  Filled 2024-04-11 (×52): qty 2

## 2024-04-11 MED ORDER — DEXAMETHASONE SODIUM PHOSPHATE 10 MG/ML IJ SOLN
INTRAMUSCULAR | Status: DC | PRN
  Administered 2024-04-11: 10 mg via INTRAVENOUS

## 2024-04-11 MED ORDER — HYDROMORPHONE HCL 1 MG/ML IJ SOLN
INTRAMUSCULAR | Status: AC
Start: 2024-04-11 — End: 2024-04-12
  Filled 2024-04-11: qty 1

## 2024-04-11 MED ORDER — HEMOSTATIC AGENTS (NO CHARGE) OPTIME
TOPICAL | Status: DC | PRN
  Administered 2024-04-11 (×2): 1 via TOPICAL

## 2024-04-11 MED ORDER — METHOCARBAMOL 1000 MG/10ML IJ SOLN
500.0000 mg | Freq: Four times a day (QID) | INTRAMUSCULAR | Status: DC | PRN
  Administered 2024-04-13: 500 mg via INTRAVENOUS

## 2024-04-11 MED ORDER — ROCURONIUM BROMIDE 10 MG/ML (PF) SYRINGE
PREFILLED_SYRINGE | INTRAVENOUS | Status: DC | PRN
  Administered 2024-04-11: 10 mg via INTRAVENOUS
  Administered 2024-04-11: 70 mg via INTRAVENOUS
  Administered 2024-04-11: 20 mg via INTRAVENOUS

## 2024-04-11 MED ORDER — FENTANYL CITRATE (PF) 250 MCG/5ML IJ SOLN
INTRAMUSCULAR | Status: DC | PRN
Start: 2024-04-11 — End: 2024-04-11
  Administered 2024-04-11: 100 ug via INTRAVENOUS
  Administered 2024-04-11 (×3): 50 ug via INTRAVENOUS

## 2024-04-11 MED ORDER — CHLORHEXIDINE GLUCONATE 0.12 % MT SOLN: 15.0000 mL | Freq: Once | OROMUCOSAL | Status: AC

## 2024-04-11 MED ORDER — GLYCOPYRROLATE PF 0.2 MG/ML IJ SOSY
PREFILLED_SYRINGE | INTRAMUSCULAR | Status: DC | PRN
Start: 2024-04-11 — End: 2024-04-11
  Administered 2024-04-11: .1 mg via INTRAVENOUS

## 2024-04-11 MED ORDER — ACETAMINOPHEN 325 MG PO TABS
650.0000 mg | ORAL_TABLET | ORAL | Status: DC | PRN
  Administered 2024-04-19 – 2024-04-21 (×2): 650 mg via ORAL
  Filled 2024-04-11 (×3): qty 2

## 2024-04-11 MED ORDER — MIDAZOLAM HCL 2 MG/2ML IJ SOLN
INTRAMUSCULAR | Status: AC
  Filled 2024-04-11: qty 2

## 2024-04-11 MED ORDER — CELECOXIB 200 MG PO CAPS: 200.0000 mg | ORAL_CAPSULE | Freq: Once | ORAL | Status: AC

## 2024-04-11 MED ORDER — PROPOFOL 1000 MG/100ML IV EMUL
INTRAVENOUS | Status: AC
  Filled 2024-04-11: qty 100

## 2024-04-11 MED ORDER — SODIUM CHLORIDE 0.9 % IV SOLN
0.1500 ug/kg/min | INTRAVENOUS | Status: DC
  Administered 2024-04-11: .1 ug/kg/min via INTRAVENOUS
  Filled 2024-04-11 (×8): qty 2000

## 2024-04-11 MED ORDER — SODIUM CHLORIDE 0.9% FLUSH
3.0000 mL | Freq: Two times a day (BID) | INTRAVENOUS | Status: DC
  Administered 2024-04-11 – 2024-04-23 (×17): 3 mL via INTRAVENOUS

## 2024-04-11 MED ORDER — PHENYLEPHRINE 80 MCG/ML (10ML) SYRINGE FOR IV PUSH (FOR BLOOD PRESSURE SUPPORT)
PREFILLED_SYRINGE | INTRAVENOUS | Status: DC | PRN
  Administered 2024-04-11: 40 ug via INTRAVENOUS

## 2024-04-11 MED ORDER — SODIUM CHLORIDE 0.9 % IV SOLN: 250.0000 mL | INTRAVENOUS | Status: AC

## 2024-04-11 MED ORDER — ALBUMIN HUMAN 5 % IV SOLN: INTRAVENOUS | Status: DC | PRN

## 2024-04-11 MED ORDER — CELECOXIB 200 MG PO CAPS
ORAL_CAPSULE | ORAL | Status: AC
  Administered 2024-04-11: 200 mg via ORAL
  Filled 2024-04-11: qty 1

## 2024-04-11 MED ORDER — ACETAMINOPHEN 500 MG PO TABS: 1000.0000 mg | ORAL_TABLET | Freq: Once | ORAL | Status: AC

## 2024-04-11 MED ORDER — MIDAZOLAM HCL 2 MG/2ML IJ SOLN
INTRAMUSCULAR | Status: DC | PRN
  Administered 2024-04-11: 2 mg via INTRAVENOUS

## 2024-04-11 MED ORDER — CEFAZOLIN SODIUM-DEXTROSE 2-4 GM/100ML-% IV SOLN: 2.0000 g | INTRAVENOUS | Status: DC

## 2024-04-11 MED ORDER — PHENYLEPHRINE 80 MCG/ML (10ML) SYRINGE FOR IV PUSH (FOR BLOOD PRESSURE SUPPORT): PREFILLED_SYRINGE | INTRAVENOUS | Status: DC | PRN

## 2024-04-11 MED ORDER — ACETAMINOPHEN 10 MG/ML IV SOLN
INTRAVENOUS | Status: AC
  Filled 2024-04-11: qty 100

## 2024-04-11 MED ORDER — ALBUTEROL SULFATE (2.5 MG/3ML) 0.083% IN NEBU: 2.5000 mg | INHALATION_SOLUTION | Freq: Four times a day (QID) | RESPIRATORY_TRACT | Status: DC | PRN

## 2024-04-11 MED ORDER — AMLODIPINE BESYLATE 5 MG PO TABS
10.0000 mg | ORAL_TABLET | Freq: Every day | ORAL | Status: DC
  Administered 2024-04-11 – 2024-04-23 (×11): 10 mg via ORAL
  Filled 2024-04-11 (×12): qty 2

## 2024-04-11 MED ORDER — 0.9 % SODIUM CHLORIDE (POUR BTL) OPTIME
TOPICAL | Status: DC | PRN
  Administered 2024-04-11 (×2): 1000 mL

## 2024-04-11 MED ORDER — ACETAMINOPHEN 10 MG/ML IV SOLN
INTRAVENOUS | Status: DC | PRN
Start: 2024-04-11 — End: 2024-04-11
  Administered 2024-04-11: 1000 mg via INTRAVENOUS

## 2024-04-11 MED ORDER — GABAPENTIN 400 MG PO CAPS
400.0000 mg | ORAL_CAPSULE | Freq: Three times a day (TID) | ORAL | Status: DC
  Administered 2024-04-11 – 2024-04-23 (×34): 400 mg via ORAL
  Filled 2024-04-11 (×34): qty 1

## 2024-04-11 MED ORDER — SODIUM CHLORIDE 0.9% FLUSH: 3.0000 mL | Freq: Two times a day (BID) | INTRAVENOUS | Status: DC

## 2024-04-11 MED ORDER — IRBESARTAN 150 MG PO TABS
150.0000 mg | ORAL_TABLET | Freq: Every day | ORAL | Status: DC
  Administered 2024-04-11 – 2024-04-23 (×12): 150 mg via ORAL
  Filled 2024-04-11 (×12): qty 1

## 2024-04-11 MED ORDER — DOCUSATE SODIUM 100 MG PO CAPS
100.0000 mg | ORAL_CAPSULE | Freq: Two times a day (BID) | ORAL | Status: DC
  Administered 2024-04-11 – 2024-04-23 (×24): 100 mg via ORAL
  Filled 2024-04-11 (×24): qty 1

## 2024-04-11 MED ORDER — HYDROMORPHONE HCL 1 MG/ML IJ SOLN
INTRAMUSCULAR | Status: DC | PRN
  Administered 2024-04-11 (×6): .25 mg via INTRAVENOUS

## 2024-04-11 MED ORDER — OXYCODONE HCL 5 MG/5ML PO SOLN: 5.0000 mg | Freq: Once | ORAL | Status: DC | PRN

## 2024-04-11 MED ORDER — FENTANYL CITRATE (PF) 250 MCG/5ML IJ SOLN
INTRAMUSCULAR | Status: AC
  Filled 2024-04-11: qty 5

## 2024-04-11 MED ORDER — PROPOFOL 10 MG/ML IV BOLUS
INTRAVENOUS | Status: DC | PRN
  Administered 2024-04-11: 20 mg via INTRAVENOUS
  Administered 2024-04-11: 150 mg via INTRAVENOUS
  Administered 2024-04-11: 30 mg via INTRAVENOUS

## 2024-04-11 MED ORDER — LIDOCAINE-EPINEPHRINE 1 %-1:100000 IJ SOLN
INTRAMUSCULAR | Status: AC
  Filled 2024-04-11: qty 1

## 2024-04-11 MED ORDER — CEFAZOLIN SODIUM 1 G IJ SOLR
INTRAMUSCULAR | Status: AC
  Filled 2024-04-11: qty 20

## 2024-04-11 MED ORDER — AMLODIPINE-OLMESARTAN 10-20 MG PO TABS: 1.0000 | ORAL_TABLET | Freq: Every day | ORAL | Status: DC

## 2024-04-11 MED ORDER — PHENYLEPHRINE HCL-NACL 20-0.9 MG/250ML-% IV SOLN
INTRAVENOUS | Status: DC | PRN
  Administered 2024-04-11: 25 ug/min via INTRAVENOUS

## 2024-04-11 MED ORDER — HYDROMORPHONE HCL 1 MG/ML IJ SOLN
0.2500 mg | INTRAMUSCULAR | Status: DC | PRN
  Administered 2024-04-11 (×4): 0.5 mg via INTRAVENOUS

## 2024-04-11 MED ORDER — OXYCODONE HCL 5 MG PO TABS: 5.0000 mg | ORAL_TABLET | Freq: Once | ORAL | Status: DC | PRN

## 2024-04-11 MED ORDER — SODIUM CHLORIDE 0.9 % IV SOLN
0.1500 ug/kg/min | INTRAVENOUS | Status: DC
  Filled 2024-04-11: qty 2000

## 2024-04-11 MED ORDER — CHLORHEXIDINE GLUCONATE 0.12 % MT SOLN
OROMUCOSAL | Status: AC
  Administered 2024-04-11: 15 mL via OROMUCOSAL
  Filled 2024-04-11: qty 15

## 2024-04-11 SURGICAL SUPPLY — 107 items
ANCHOR LUMBAR 25 MIS (Anchor) IMPLANT
BAG COUNTER SPONGE SURGICOUNT (BAG) ×9 IMPLANT
BASKET BONE COLLECTION (BASKET) IMPLANT
BENZOIN TINCTURE PRP APPL 2/3 (GAUZE/BANDAGES/DRESSINGS) IMPLANT
BLADE CLIPPER SURG (BLADE) IMPLANT
BLADE EXTENDER LIF DISP N26 (INSTRUMENTS) IMPLANT
BUR CARBIDE MATCH 3.0 (BURR) IMPLANT
BUR PRECISION FLUTE 5.0 (BURR) IMPLANT
BUR SURG IBUR 4X12.5 (BURR) ×3 IMPLANT
CANISTER SUCT 3000ML PPV (MISCELLANEOUS) ×3 IMPLANT
CAP PUSHER PTP (ORTHOPEDIC DISPOSABLE SUPPLIES) IMPLANT
CLIP APPLIE 11 MED OPEN (CLIP) ×6 IMPLANT
CLIP LIGATING EXTRA MED SLVR (CLIP) IMPLANT
CLIP SPRING STIM LLIF SAFEOP (CLIP) IMPLANT
CNTNR URN SCR LID CUP LEK RST (MISCELLANEOUS) IMPLANT
COVER BACK TABLE 60X90IN (DRAPES) ×3 IMPLANT
DERMABOND ADVANCED .7 DNX12 (GAUZE/BANDAGES/DRESSINGS) ×6 IMPLANT
DILATOR INSULATED SAFEOP OVAL (NEUROSURGERY SUPPLIES) IMPLANT
DISSECTOR BLUNT TIP ENDO 5MM (MISCELLANEOUS) IMPLANT
DRAPE C-ARM 42X72 X-RAY (DRAPES) ×6 IMPLANT
DRAPE C-ARMOR (DRAPES) ×6 IMPLANT
DRAPE LAPAROTOMY 100X72X124 (DRAPES) ×6 IMPLANT
DRAPE SHEET LG 3/4 BI-LAMINATE (DRAPES) IMPLANT
DRAPE SURG ORHT 6 SPLT 77X108 (DRAPES) IMPLANT
DRAPE UTILITY XL STRL (DRAPES) ×3 IMPLANT
DRSG OPSITE POSTOP 4X10 (GAUZE/BANDAGES/DRESSINGS) IMPLANT
DRSG OPSITE POSTOP 4X8 (GAUZE/BANDAGES/DRESSINGS) IMPLANT
DURAPREP 26ML APPLICATOR (WOUND CARE) ×6 IMPLANT
ELECT COATED BLADE 2.86 ST (ELECTRODE) ×3 IMPLANT
ELECTRODE BLDE 4.0 EZ CLN MEGD (MISCELLANEOUS) ×3 IMPLANT
ELECTRODE BLDE INSULATED 6.5IN (ELECTROSURGICAL) ×3 IMPLANT
ELECTRODE KT SAFEOP SSEP/SURF (KITS) IMPLANT
ELECTRODE REM PT RTRN 9FT ADLT (ELECTROSURGICAL) ×6 IMPLANT
GAUZE 4X4 16PLY ~~LOC~~+RFID DBL (SPONGE) IMPLANT
GAUZE SPONGE 4X4 12PLY STRL (GAUZE/BANDAGES/DRESSINGS) IMPLANT
GLOVE BIO SURGEON STRL SZ 6.5 (GLOVE) IMPLANT
GLOVE BIO SURGEON STRL SZ7 (GLOVE) ×12 IMPLANT
GLOVE BIO SURGEON STRL SZ7.5 (GLOVE) ×3 IMPLANT
GLOVE BIOGEL PI IND STRL 7.0 (GLOVE) IMPLANT
GLOVE BIOGEL PI IND STRL 7.5 (GLOVE) ×12 IMPLANT
GLOVE BIOGEL PI IND STRL 8 (GLOVE) ×3 IMPLANT
GLOVE ECLIPSE 6.5 STRL STRAW (GLOVE) IMPLANT
GLOVE ECLIPSE 7.5 STRL STRAW (GLOVE) ×6 IMPLANT
GOWN STRL REUS W/ TWL LRG LVL3 (GOWN DISPOSABLE) IMPLANT
GOWN STRL REUS W/ TWL XL LVL3 (GOWN DISPOSABLE) ×18 IMPLANT
GOWN STRL REUS W/TWL 2XL LVL3 (GOWN DISPOSABLE) ×6 IMPLANT
GRAFT TRINITY ELITE LGE HUMAN (Tissue) IMPLANT
GUIDEWIRE LLIF TT 310 (WIRE) IMPLANT
HEMOSTAT POWDER KIT SURGIFOAM (HEMOSTASIS) ×6 IMPLANT
HEMOSTAT SNOW SURGICEL 2X4 (HEMOSTASIS) IMPLANT
INSERT FOGARTY 61MM (MISCELLANEOUS) IMPLANT
INSERT FOGARTY SM (MISCELLANEOUS) IMPLANT
KIT BASIN OR (CUSTOM PROCEDURE TRAY) ×6 IMPLANT
KIT INFUSE MEDIUM (Orthopedic Implant) IMPLANT
KIT LATERAL SUPPORT PATIENT AL (ORTHOPEDIC DISPOSABLE SUPPLIES) IMPLANT
KIT TURNOVER KIT B (KITS) ×6 IMPLANT
KNIFE ANNULOTOMY GREY RETRACT (ORTHOPEDIC DISPOSABLE SUPPLIES) IMPLANT
MATRIX SPINE STRIP NEOCORE 5CC (Putty) IMPLANT
MATRIX STRIP NEOCORE 12C (Putty) IMPLANT
MODULE LATERAL PT POSITION (ORTHOPEDIC DISPOSABLE SUPPLIES) IMPLANT
NDL HYPO 21X1.5 SAFETY (NEEDLE) ×3 IMPLANT
NDL HYPO 22X1.5 SAFETY MO (MISCELLANEOUS) ×6 IMPLANT
NDL SPNL 18GX3.5 QUINCKE PK (NEEDLE) ×3 IMPLANT
NEEDLE HYPO 21X1.5 SAFETY (NEEDLE) ×3 IMPLANT
NEEDLE HYPO 22X1.5 SAFETY MO (MISCELLANEOUS) ×6 IMPLANT
NEEDLE SPNL 18GX3.5 QUINCKE PK (NEEDLE) ×6 IMPLANT
NS IRRIG 1000ML POUR BTL (IV SOLUTION) ×6 IMPLANT
PACK LAMINECTOMY NEURO (CUSTOM PROCEDURE TRAY) ×6 IMPLANT
PAD ARMBOARD POSITIONER FOAM (MISCELLANEOUS) ×6 IMPLANT
PATTIES SURGICAL .5 X.5 (GAUZE/BANDAGES/DRESSINGS) ×6 IMPLANT
PATTIES SURGICAL 1X1 (DISPOSABLE) ×6 IMPLANT
PLATE LLIF AMP 15 2 SCREW 4 (Plate) IMPLANT
PLATE LLIF AMP 6 15 1-SCREW (Plate) IMPLANT
PROBE BALL TIP LLIF SAFEOP (NEUROSURGERY SUPPLIES) IMPLANT
ROD SPINAL THRD DISP (MISCELLANEOUS) IMPLANT
SCREW LIF AMP 5.5X45 BONE X2 (Screw) IMPLANT
SHIM INTRADISCAL PTP NARROW (ORTHOPEDIC DISPOSABLE SUPPLIES) IMPLANT
SPACER HEDRON IA 29X39X15 8D (Spacer) IMPLANT
SPACER HEDRON IA 29X39X19 20D (Spacer) IMPLANT
SPACER IDENTITI NT 22X60X8 0D (Spacer) IMPLANT
SPACER LIF IDENTITI 8X22X60 10 (Spacer) IMPLANT
SPIKE FLUID TRANSFER (MISCELLANEOUS) ×6 IMPLANT
SPONGE INTESTINAL PEANUT (DISPOSABLE) ×6 IMPLANT
SPONGE NEURO XRAY DETECT 1X3 (DISPOSABLE) IMPLANT
SPONGE SURGIFOAM ABS GEL SZ50 (HEMOSTASIS) IMPLANT
SPONGE T-LAP 18X18 ~~LOC~~+RFID (SPONGE) ×6 IMPLANT
SPONGE T-LAP 4X18 ~~LOC~~+RFID (SPONGE) IMPLANT
SPONGE TONSIL 1 RF SGL (DISPOSABLE) IMPLANT
STAPLER VISISTAT (STAPLE) ×3 IMPLANT
STRIP CLOSURE SKIN 1/2X4 (GAUZE/BANDAGES/DRESSINGS) IMPLANT
SUT MNCRL AB 4-0 PS2 18 (SUTURE) ×3 IMPLANT
SUT PDS AB 1 CTX 36 (SUTURE) ×3 IMPLANT
SUT PROLENE 4-0 RB1 .5 CRCL 36 (SUTURE) IMPLANT
SUT PROLENE 5 0 CC1 (SUTURE) IMPLANT
SUT SILK 2 0 TIES 10X30 (SUTURE) ×3 IMPLANT
SUT SILK 2-0 18XBRD TIE BLK (SUTURE) ×3 IMPLANT
SUT VIC AB 0 CT1 18XCR BRD8 (SUTURE) ×6 IMPLANT
SUT VIC AB 2-0 CP2 18 (SUTURE) ×6 IMPLANT
SUT VIC AB 3-0 SH 8-18 (SUTURE) IMPLANT
SUT VICRYL 4-0 PS2 18IN ABS (SUTURE) ×3 IMPLANT
SYR 20ML LL LF (SYRINGE) ×3 IMPLANT
SYR 30ML SLIP (SYRINGE) ×6 IMPLANT
SYS LTP ILLUMINATOR SIGMA ATEC (ORTHOPEDIC DISPOSABLE SUPPLIES) IMPLANT
TOWEL GREEN STERILE (TOWEL DISPOSABLE) ×9 IMPLANT
TOWEL GREEN STERILE FF (TOWEL DISPOSABLE) ×6 IMPLANT
TRAY FOLEY MTR SLVR 16FR STAT (SET/KITS/TRAYS/PACK) ×3 IMPLANT
WATER STERILE IRR 1000ML POUR (IV SOLUTION) ×6 IMPLANT

## 2024-04-11 NOTE — Anesthesia Procedure Notes (Signed)
 Arterial Line Insertion Start/End4/30/2025 6:50 AM, 04/11/2024 6:55 AM  Patient location: Pre-op. Preanesthetic checklist: patient identified, IV checked, site marked, risks and benefits discussed, surgical consent, monitors and equipment checked, pre-op evaluation and timeout performed Lidocaine  1% used for infiltration Left, radial was placed Catheter size: 20 G Hand hygiene performed , maximum sterile barriers used  and Seldinger technique used Allen's test indicative of satisfactory collateral circulation Attempts: 1 Procedure performed using ultrasound guided technique. Ultrasound Notes:anatomy identified, needle tip was noted to be adjacent to the nerve/plexus identified and no ultrasound evidence of intravascular and/or intraneural injection Following insertion, dressing applied and Biopatch. Post procedure assessment: normal  Patient tolerated the procedure well with no immediate complications.

## 2024-04-11 NOTE — H&P (Signed)
 History and Physical Interval Note:  04/11/2024 7:27 AM  Bradley Fisher  has presented today for surgery, with the diagnosis of OTHER FORM OF SCOLIOSIS OF THORACOLUMBAR SPINE.  The various methods of treatment have been discussed with the patient and family. After consideration of risks, benefits and other options for treatment, the patient has consented to  Procedure(s) with comments: ANTERIOR LUMBAR FUSION 2 LEVELS (Right) - ALIF L45, L5S1; DLIF L12, L34 RT SIDE LATERAL DECUB ANTERIOR LATERAL LUMBAR FUSION 2 LEVELS (Right) - L12, L34 DLIF RT SIDE LATERAL DECUB ABDOMINAL EXPOSURE (N/A) as a surgical intervention.  The patient's history has been reviewed, patient examined, no change in status, stable for surgery.  I have reviewed the patient's chart and labs.  Questions were answered to the patient's satisfaction.     Young Hensen     Patient name: Bradley Fisher        MRN: [id number]        DOB: May 09, 1966        Sex: male   REASON FOR CONSULT: Abdominal exposure for L4-S1 ALIF   HPI: Bradley Fisher is a 58 y.o. male, with history of hypertension, hyperlipidemia, seizures that presents for evaluation of anterior spine exposure for L4-S1 ALIF.  Patient has multilevel thoracolumbar degenerative disease with multilevel spinal stenosis.  Evaluated by Dr. Andy Bannister with neurosurgery.  He has recommended L4-L5 and L5-S1 ALIF with a L1-L2 and L3-L4 TLIF for stage I and then a stage II T10 to pelvis fusion.       Past Medical History:  Diagnosis Date   Arthritis     Asthma     Depression     GERD (gastroesophageal reflux disease)     Hyperlipidemia     Hypertension     Seizures (HCC)      unknow etiology; on meds for this; no seizures since on meds 10 years ago               Past Surgical History:  Procedure Laterality Date   COLONOSCOPY WITH PROPOFOL  N/A 05/29/2018    Procedure: COLONOSCOPY WITH PROPOFOL ;  Surgeon: Suzette Espy, MD;  Location: [facility];  Service:  Endoscopy;  Laterality: N/A;  9:45am   KNEE ARTHROSCOPY WITH MEDIAL MENISECTOMY Left 11/11/2016    Procedure: LEFT KNEE ARTHROSCOPY WITH MEDIAL MENISECTOMY;  Surgeon: Darrin Emerald, MD;  Location: [facility];  Service: Orthopedics;  Laterality: Left;   Thumb surgery-left   02/25/2020               Family History  Problem Relation Age of Onset   Hypertension Mother     Diabetes Mother     Hypertension Father     Diabetes Father     Diabetes Brother     Colon cancer Neg Hx            SOCIAL HISTORY: Social History         Socioeconomic History   Marital status: Married      Spouse name: Not on file   Number of children: 0   Years of education: Not on file   Highest education level: 7th grade  Occupational History   Occupation: Disabled  Tobacco Use   Smoking status: Some Days      Types: Cigars      Start date: 01/22/1973      Passive exposure: Past   Smokeless tobacco: Current      Types: Snuff   Tobacco comments:  Smokes 1 time a month/03/03/22  Vaping Use   Vaping status: Never Used  Substance and Sexual Activity   Alcohol use: Yes      Alcohol/week: 12.0 standard drinks of alcohol      Types: 12 Cans of beer per week      Comment: drinks some weekends.    Drug use: No   Sexual activity: Yes      Birth control/protection: None  Other Topics Concern   Not on file  Social History Narrative   Not on file    Social Drivers of Health        Financial Resource Strain: Low Risk  (02/12/2019)    Overall Financial Resource Strain (CARDIA)     Difficulty of Paying Living Expenses: Not hard at all  Food Insecurity: No Food Insecurity (02/12/2019)    Hunger Vital Sign     Worried About Running Out of Food in the Last Year: Never true     Ran Out of Food in the Last Year: Never true  Transportation Needs: No Transportation Needs (02/12/2019)    PRAPARE - Therapist, art (Medical): No     Lack of Transportation (Non-Medical): No   Physical Activity: Inactive (02/12/2019)    Exercise Vital Sign     Days of Exercise per Week: 0 days     Minutes of Exercise per Session: 0 min  Stress: No Stress Concern Present (02/12/2019)    [employer] of Occupational Health - Occupational Stress Questionnaire     Feeling of Stress : Not at all  Social Connections: Moderately Integrated (02/12/2019)    Social Connection and Isolation Panel [NHANES]     Frequency of Communication with Friends and Family: Twice a week     Frequency of Social Gatherings with Friends and Family: More than three times a week     Attends Religious Services: More than 4 times per year     Active Member of [employer] or Organizations: No     Attends Banker Meetings: Never     Marital Status: Married  Catering manager Violence: Not At Risk (02/12/2019)    Humiliation, Afraid, Rape, and Kick questionnaire     Fear of Current or Ex-Partner: No     Emotionally Abused: No     Physically Abused: No     Sexually Abused: No      Allergies  No Known Allergies           Current Outpatient Medications  Medication Sig Dispense Refill   Albuterol  Sulfate (PROAIR  RESPICLICK) 108 (90 Base) MCG/ACT AEPB INHALE 2 PUFFS EVERY 6 HOURS AS NEEDED FOR WHEEZING OR SHORTNESS OF BREATH       amLODipine  (NORVASC ) 5 MG tablet Take 1 tablet by mouth daily.       amlodipine -olmesartan (AZOR) 10-20 MG tablet Take 1 tablet by mouth daily.       atorvastatin  (LIPITOR) 10 MG tablet TAKE 1 TABLET DAILY 90 tablet 3   celecoxib  (CELEBREX ) 200 MG capsule Take 200 mg by mouth daily.       cetirizine  (ZYRTEC ) 10 MG tablet Take 1 tablet (10 mg total) by mouth daily. 90 tablet 3   clotrimazole-betamethasone (LOTRISONE) cream Apply topically 2 (two) times daily.       esomeprazole  (NEXIUM ) 40 MG capsule Take 1 capsule by mouth daily.       fluticasone  (FLONASE ) 50 MCG/ACT nasal spray Place 2 sprays into both nostrils daily. 16 g 6  gabapentin  (NEURONTIN ) 400 MG capsule Take 1  capsule (400 mg total) by mouth at bedtime. For PAIN (Patient taking differently: Take 400 mg by mouth 2 (two) times daily. For PAIN) 90 capsule 1   HYDROcodone -acetaminophen  (NORCO) 10-325 MG tablet Take 1 tablet by mouth 2 (two) times daily as needed for severe pain. 30 tablet 0   lisinopril  (ZESTRIL ) 20 MG tablet Take 1 tablet by mouth daily.       meloxicam  (MOBIC ) 15 MG tablet Take 1 tablet (15 mg total) by mouth daily. 90 tablet 1   methocarbamol (ROBAXIN) 750 MG tablet Take 750 mg by mouth 4 (four) times daily.       naloxone  (NARCAN ) nasal spray 4 mg/0.1 mL Place 1 spray into the nose. (Patient not taking: Reported on 03/03/2022)       PROAIR  HFA 108 (90 Base) MCG/ACT inhaler Inhale 2 puffs into the lungs every 6 (six) hours as needed for wheezing or shortness of breath. 18 g 2   RESTASIS 0.05 % ophthalmic emulsion 1 drop 2 (two) times daily. (Patient not taking: Reported on 03/03/2022)       rosuvastatin (CRESTOR) 5 MG tablet Take 5 mg by mouth daily.       sertraline  (ZOLOFT ) 100 MG tablet Take 1 tablet by mouth daily.          No current facility-administered medications for this visit.        REVIEW OF SYSTEMS:  [X]  denotes positive finding, [ ]  denotes negative finding Cardiac   Comments:  Chest pain or chest pressure:      Shortness of breath upon exertion:      Short of breath when lying flat:      Irregular heart rhythm:             Vascular      Pain in calf, thigh, or hip brought on by ambulation:      Pain in feet at night that wakes you up from your sleep:       Blood clot in your veins:      Leg swelling:              Pulmonary      Oxygen at home:      Productive cough:       Wheezing:              Neurologic      Sudden weakness in arms or legs:       Sudden numbness in arms or legs:       Sudden onset of difficulty speaking or slurred speech:      Temporary loss of vision in one eye:       Problems with dizziness:              Gastrointestinal       Blood in stool:       Vomited blood:              Genitourinary      Burning when urinating:       Blood in urine:             Psychiatric      Major depression:              Hematologic      Bleeding problems:      Problems with blood clotting too easily:             Skin  Rashes or ulcers:             Constitutional      Fever or chills:          PHYSICAL EXAM: There were no vitals filed for this visit.   GENERAL: The patient is a well-nourished male, in no acute distress. The vital signs are documented above. CARDIAC: There is a regular rate and rhythm.  VASCULAR:  Bilateral femoral pulses palpable Bilateral PT pulses palpable PULMONARY: No respiratory distress. ABDOMEN: Soft and non-tender. MUSCULOSKELETAL: There are no major deformities or cyanosis. NEUROLOGIC: No focal weakness or paresthesias are detected. SKIN: There are no ulcers or rashes noted. PSYCHIATRIC: The patient has a normal affect.   DATA:    MRI Lumbar Reviewed on 09/12/23:      Assessment/Plan:   58 y.o. male, with history of hypertension, hyperlipidemia, seizures, and chronic back pain that presents for evaluation of anterior spine exposure for L4-S1 ALIF.  I reviewed the MRI as pictured above and discussed he would be a good candidate for anterior approach.  I discussed paramedian incision over the left rectus muscle then entering into the retroperitoneum to mobilize peritoneum and left ureter across midline.  I discussed mobilizing iliac artery and vein.  Discussed risk of injury to the above structures including a vascular injury.  Discussed risk of retrograde ejaculation and hernia.  Look forward to assisting Dr. Andy Bannister.  All questions answered.     Young Hensen, MD [facility] Office: [phone]

## 2024-04-11 NOTE — Anesthesia Procedure Notes (Addendum)
 Procedure Name: Intubation Date/Time: 04/11/2024 7:59 AM  Performed by: Merna Aase, CRNAPre-anesthesia Checklist: Patient identified, Patient being monitored, Timeout performed, Emergency Drugs available and Suction available Patient Re-evaluated:Patient Re-evaluated prior to induction Oxygen Delivery Method: Circle system utilized Preoxygenation: Pre-oxygenation with 100% oxygen Induction Type: IV induction Ventilation: Oral airway inserted - appropriate to patient size and Two handed mask ventilation required Laryngoscope Size: Glidescope and 3 Grade View: Grade I Tube type: Oral Tube size: 7.5 mm Number of attempts: 1 Airway Equipment and Method: Stylet, Rigid stylet and Video-laryngoscopy Placement Confirmation: ETT inserted through vocal cords under direct vision, positive ETCO2 and breath sounds checked- equal and bilateral Secured at: 22 cm Tube secured with: Tape Dental Injury: Teeth and Oropharynx as per pre-operative assessment

## 2024-04-11 NOTE — Progress Notes (Signed)
 Received from PACU. Alert and orient. Verbalizing needs. MOE x 4. Dressing to [facility] and R flank intact. SCD's applied.

## 2024-04-11 NOTE — Progress Notes (Signed)
 Orthopedic Tech Progress Note Patient Details:  Bradley Fisher Bradley Fisher [birth year] [id number]  Ortho Devices Type of Ortho Device: Lumbar corsett Ortho Device/Splint Location: BACK Ortho Device/Splint Interventions: Ordered, Other (comment), Removal   Post Interventions Patient Tolerated: Poor, Well  Bradley Fisher 04/11/2024, 8:13 PM

## 2024-04-11 NOTE — Op Note (Signed)
 Date: April 11, 2024  Preoperative diagnosis: Chronic lower back pain  Postoperative diagnosis: Same  Procedure: Anterior retroperitoneal spine exposure for L4-L5 and L5-S1 ALIF  Surgeon: Dr. Young Hensen, MD  Co-surgeon: Dr. Arvilla Birmingham, MD  Assistant: Dr. Delaney Fearing, MD  Indications: 58 year old male with chronic lower back pain with severe degenerative disc disease with scoliosis and spinal stenosis.  He presents for multilevel spinal surgery.  Vascular surgery has been asked to assist with anterior spine exposure for L4-L5 and L5-S1 ALIF.  He presents after risks-benefits discussed.  Findings: The L4-L5 and L5-S1 disc space were marked over the left rectus muscle with the fluoroscopic C arm in the lateral position.  Paramedian incision was made here and we opened the anterior rectus sheath and then mobilized left rectus muscle to the midline.  Posterior rectus sheath was opened above arcuate line to get more exposure superiorly.  Initially went to the L5-S1 disc space and ligated the middle sacral vessels and mobilized the left iliac vein lateral.  I then went up one level to the L4-L5 disc space and mobilized the aorta and IVC lateral to the disc space.  I did have a tear in a lumbar during mobilization that required a clip and additional suture repair.  Retractors were initially placed at the L5-S1 disc space.  After the implant was completed at L5-S1, I came back and moved the retractors up one level to the L4-L5 disc space.  Anesthesia: General  Details: Patient was taken to the operating room after informed consent was obtained.  Placed on the operative table in supine position.  General endotracheal anesthesia was induced.  Fluoroscopic C-arm was then used in the lateral position and we marked the L4-L5 and L5-S1 disc space over the left rectus muscle.  The abdominal wall was then prepped and draped in standard sterile fashion.  Antibiotics were given and timeout  performed.  Initially made my incision over the left rectus muscle in paramedian fashion.  Dissected down through the soft tissue with Bovie cautery and used cerebellar retractors.  The anterior rectus sheath was opened longitudinally as well.  I raised flaps underneath the anterior rectus sheath and mobilized the left rectus to the midline.  I then mobilized peritoneum and left ureter to the midline with blunt dissection and Kd.  Ultimately I mobilized inferiorly and got peritoneum off of posterior rectus sheath and then I opened the posterior rectus sheath above arcuate line with Metzenbaum scissors to get to the L4-5 disc space.  Ultimately initially went to the L5-S1 disc space.  The middle sacral vessels were identified and these were ligated between 2-0 silk ties clips and divided.  I then mobilized on both sides of the disc space including mobilizing the left iliac vein lateral with blunt dissection using Kd and suction.  We then had good working room here.  I then went up one level and mobilized the left iliac artery towards the aorta and all this was mobilized to the midline.  I encountered the left iliac vein as well as the IVC that was continued to be mobilized to the midline.  Ultimately several iliolumbar branches were ligated between 2-0 silk ties and divided.  As I was mobilizing superiorly I did have a traction injury on the lumbar artery adjacent to the aorta.  I did call my partner Dr. Susi Eric for further assistance to visualize and enable us  to get this repaired.  Upon further retraction I was able to get a clip  on the lumbar artery on the aorta side with hemostasis.  I then used a 4-0 Prolene and I put an additional suture to reinforce the clip with a figure-of-eight.  We appeared to have good hemostasis.  The retractors were then brought on the field with the NuVasive retractor.  I went to the L5-S1 disc space first.  I then placed 160 reverse lip retractors on each side of the disc space with a  140 reverse lip caudal and 120 reverse-slope cranial.  We had good exposure.  Needle was placed in the disc space and confirmed we were at the correct level on lateral fluoroscopy.  Case was turned over to Dr. Andy Bannister.  I was called back in the room after the implant was completed L5-S1.  I removed the retractors and there was good hemostasis at this level.  I then used hand-held Wiley retractors and went up one level to the L4-L5 disc space where we previously mobilized the aorta and IVC.  Ultimately I put 140 reverse ellipse on each side of the disc base at L4-L5.  There was a branch on the left iliac vein that was bleeding that we repaired with a 5-0 Prolene with a figure-of-eight with good hemostasis.  I then put 120 reverse lips cranial and caudal.  We had good anterior exposure.  The needle was placed in the disc space and confirmed we were at the L4-L5 level.  Case was turned over to Dr. Andy Bannister.  I was called back in the room after the implant was completed at L4-L5.  I scrubbed back in.  We took the retractors out at L4-L5.  I then used hand-held Wiley retractors and examined the left iliac vein and there was good hemostasis here.  I then rolled the aorta over and looked at our repair on the lumbar artery on the back of the aorta and the clip was intact as well as our Prolene suture with good hemostasis.  Case was turned back over to Dr. Andy Bannister.  Complication: None  Condition: Stable  Young Hensen, MD [facility] Office: [phone]   Young Hensen

## 2024-04-11 NOTE — Op Note (Signed)
 PREOP DIAGNOSIS: lumbar radiculopathy, degenerative scoliosis  POSTOP DIAGNOSIS: lumbar radiculopathy, degenerative scoliosis   PROCEDURE: 1. Arthrodesis L5-S1, anterior interbody technique, via anterior retroperitoneal approach 2. Arthrodesis L4-5, anterior interbody technique, via anterior retroperitoneal approach 3. Arthrodesis L3-4, anterior interbody technique, via right retroperitoneal transpsoas approach 4. Arthrodesis L1-2, anterior interbody technique, via right retroperitoneal transpsoas approach 5. Placement of intervertebral biomechanical device L1-2, L3-4, L4-5, L5-S1 6. Anterior instrumentation/fixation with integrated bone anchor placement L5-S1 and L4-5 7.  Lateral plating, L1-2, L3-4 8. Use of morselized bone allograft    SURGEON: Dr. Arvilla Birmingham, MD  ASSISTANT: Audie Bleacher, MD, Easter Golden, PA.  Please note, no qualified trainees were available to assist with the procedure.  Assistance was required for retraction of the visceral structures to safely allow for instrumentation.  ANESTHESIA: General Endotracheal  EBL: 400 ml  IMPLANTS: Globus L4-5 29 x 39 x 15 mm, 8 degree interbody L5-S1 29 x 39 x 19 mm, 20 degree interbody  Alpha-Tec L1-2 8 x 22 x 60 mm cage, size 4 plate, 45 mm screw L3-4: 8/12 (posterior/anterior) x 22 x 60 mm cage, 10 degree lordosis, size 6 plate, 45 mm screws  Medium Infuse, 17 ml neocore  SPECIMENS: None  DRAINS: None  COMPLICATIONS: None immediate  CONDITION: Hemodynamically stable to PACU  HISTORY: This is a 58 yo M with history of L2-5 MIS decompression with Dr. Ali Antonio in 2021. Over past several years has had increased lower back pain. He tried PT but had to stop since it was making his symptoms worsening. Pain is in lower back, worse with walking. he was diagnosed with collapsed discs. He also has left leg sciatica, worse at night. it goes down to the ankle, worsening over the past year.  ESIs x 2 failed to improve his  symptoms. .  Risks, benefits, alternatives, and expected convalescence were discussed with the patient.  Risks discussed included but were not limited to bleeding, pain, infection, vascular injury, damage to intraabdominal organs, pseudoarthrosis, hardware failure, adjacent segment disease, CSF leak, neurologic deficits, weakness, numbness, paralysis, coma, and death. After all questions were answered, informed consent was obtained.  PROCEDURE IN DETAIL: The patient was brought to the op room.  General anesthesia was induced and patient was intubated by the anesthesia service.  After appropriate lines and monitors were placed, patient was was in supine and C-arm x-ray was used to localize the L5-S1 disc space over the skin.  The belly was prepped and draped in sterile fashion.  A timeout was performed.  Preoperative antibiotics were administered.   Dr. Fulton Job performed the exposure of the L4-5 and L5-S1 disc spaces via a retroperitoneal approach, and I acted as an Geophysicist/field seismologist.  Please see his notes for details.  With the retractors in place in the L5-S1 disc space exposed, the midline was determined by AP x-ray and was marked on the L5 and S1 anterior bodies for reference.  Annulotomy was made with a 10 blade and Cobb elevator was used to dissect the bulk of the disc from the endplates, removing the disc and 1 large piece.  Posterior disc was then removed with combination of curettes and rongeurs.  The endplates were then prepared with curettes and rasps.  Good discectomy back to the posterior body was confirmed with x-ray.  Trials were then placed under x-ray guidance and an appropriately sized implant was selected and filled with BMP and allograft.  Under x-ray guidance, this was tamped in place, with good restoration of disc space height  achieved as well as a small amount of additional lordosis restored.  Bone anchors were then tamped into the adjacent L5 and S1 bodies under x-ray guidance.     The retractors  were removed and meticulous hemostasis was obtained in the retroperitoneal space. Attention was then turned to L4-5.  Dr. Fulton Job performed the exposure of the L4-5 disc space and positioned the retractor.  X-ray confirmed localization.  Annulotomy was made with a 10 blade and Cobb elevator was used to dissect the bulk of the disc from the endplates, removing the disc and 1 large piece.  Posterior disc was then removed with combination of curettes and rongeurs.  The endplates were then prepared with curettes and rasps.  Good discectomy back to the posterior body was confirmed with x-ray.  Trials were then placed under x-ray guidance and an appropriately sized implant was selected and filled with BMP and allograft.  Under x-ray guidance, this was tamped in place, with good restoration of disc space height achieved as well as a small amount of additional lordosis restored.  Bone anchors were then tamped into the adjacent L4 and L5 bodies under x-ray guidance.     The wound was irrigated thoroughly.  The anterior rectus sheath was then closed tightly with 0 vicryl in interrupted fashion.  The dermal layer was closed with 2-0 Vicryl stitches in buried interrupted fashion.  The skin was closed with 4 Monocryl in subcuticular manner followed by Dermabond.   The patient was brought to the operative room.  General anesthesia was induced and patient was intubated by the anesthesia service.  After appropriate lines and monitors were placed, patient was positioned in the lateral decubitus position with the right side up.  All pressure points padded and the eyes were protected.  The patient and the bed was positioned for a true lateral and AP C-arm x-rays at each level.  The flank was preprepped with alcohol and prepped and draped in sterile fashion.  A timeout was performed.  1% at lidocaine  with epinephrine  was injected in the planned incision.  Using the C-arm x-ray, a oblique incision was planned over the flank to span  the L1-2 and L3-4 disc spaces at the concavity.  The L2-3 disc was almost completely autofused already radiographically.  Incision was made with a 10 blade and the fascia was opened.  The external oblique, internal oblique, and transversalis muscle and transversalis fascia were opened bluntly with spreading instruments.  The retroperitoneal space was dissected and the peritoneal contents were reflected anteriorly.  The quadratus lumborum, transverse process and finally the psoas muscle was dissected out.  A initial dilator was placed on the lateral surface of the psoas muscle over the L3-4 disc space as confirmed by x-ray.  The dilator was then passed through the psoas muscle and docked onto the disc.  Stimulation showed there was no proximity of any lumbar plexus nerves.  The dilator was secured in place with a K wire and subsequent dilators were passed and stimulated and again yielded no neural activity nearby.  Retractor was then passed onto the disc space and secured in place with a bed attachment.  The dilators were removed and the field on the posterior blade were directly stimulated.  There was no visual evidence or electromyographic evidence of nerves in the field.  The retractor was then opened cranially, caudally, and anteriorly and again the field was stimulated with no evidence of nerves in the field.  Osteophyte and disc space were opened with  a osteotome using C-arm guidance and discectomy was performed with pituitary rongeurs and box cutters.  The contralateral annulus was released with Cobb and the disc space was prepared with curettes and rasps.  Trial implants were placed and with the aid of fluoroscopy with AP and lateral projections, a size 8 mm implant with 10 degree lordosis  mm graft was placed.  The retractor was then removed.    Attention was then turned to the L1-2  disc space where the lateral aspect of the psoas was palpated and initial dilator was placed over it with positioning  confirmed by x-ray.  The dilator was then placed over the lateral osteophyte at the disc space and stimulated which revealed the lumbar plexus nerves were posterior to the dilator but remote.  In similar fashion, subsequent dilators were placed and stimulated and finally the retractor was placed and secured to the bed attachment.  The field was stimulated and no nerves were able to be identified in the field.  Posterior shim was placed to lock the retractor in place and the retractor blades were opened cranially, caudally, and anteriorly.  The collapsed disc space was entered with a osteotome using x-ray guidance.  Subsequently, Cobb was used to release the contralateral annulus.  This allowed good opening of the disc space.  The disc was fairly degenerated, with minimal disc remnants, but complete discectomy was performed with curettes and pituitary rongeurs.  The endplates were prepared with rasps.  Trials and a final size 8 mm  implant was then placed under x-ray guidance.  The retractor was then removed.    There were no changes in the femoral nerve SSEPs throughout the case.  Final x-rays showed good restoration of disc space height and foraminal height, and partial correction of coronal deformity.  The retroperitoneal space was then irrigated and examined with no evidence of any bleeding.  The fascia was closed with 0 Vicryl stitches.  The dermal layer was closed with 2-0 Vicryl stitches.  Skin was closed with 4-0 Monocryl subcuticular manner followed by Dermabond.  The patient tolerated the procedure well and was extubated in the room and taken to the postanesthesia care unit in stable condition.  All counts were correct at the end of the procedure.

## 2024-04-11 NOTE — Plan of Care (Signed)

## 2024-04-11 NOTE — Transfer of Care (Signed)
 Immediate Anesthesia Transfer of Care Note  Patient: Lauren Ponds Szeto  Procedure(s) Performed: LUMBAR FOUR-FIVE, LUMBAR FIVE-SACRAL ONE ANTERIOR LUMBAR FUSION (Right: Abdomen) LUMBAR ONE-TWO, LUMBAR THREE-FOUR ANTERIOR LATERAL LUMBAR FUSION (Right: Flank) ABDOMINAL EXPOSURE  Patient Location: PACU  Anesthesia Type:General  Level of Consciousness: drowsy and responds to stimulation  Airway & Oxygen Therapy: Patient Spontanous Breathing and Patient connected to face mask oxygen  Post-op Assessment: Report given to RN and Post -op Vital signs reviewed and stable  Post vital signs: Reviewed and stable  Last Vitals:  Vitals Value Taken Time  BP 158/100 04/11/24 1619  Temp    Pulse 90 04/11/24 1626  Resp 14 04/11/24 1626  SpO2 98 % 04/11/24 1626  Vitals shown include unfiled device data.  Last Pain:  Vitals:   04/11/24 0619  TempSrc:   PainSc: 10-Worst pain ever         Complications: No notable events documented.

## 2024-04-12 ENCOUNTER — Inpatient Hospital Stay (HOSPITAL_COMMUNITY)

## 2024-04-12 MED ORDER — TRANEXAMIC ACID-NACL 1000-0.7 MG/100ML-% IV SOLN
1000.0000 mg | INTRAVENOUS | Status: AC
  Administered 2024-04-13: 1000 mg via INTRAVENOUS
  Filled 2024-04-12 (×2): qty 100

## 2024-04-12 MED ORDER — CEFAZOLIN SODIUM-DEXTROSE 2-4 GM/100ML-% IV SOLN
2.0000 g | INTRAVENOUS | Status: AC
  Administered 2024-04-13 (×2): 2 g via INTRAVENOUS
  Filled 2024-04-12: qty 100

## 2024-04-12 MED ORDER — POTASSIUM CHLORIDE IN NACL 20-0.9 MEQ/L-% IV SOLN
INTRAVENOUS | Status: AC
  Filled 2024-04-12 (×2): qty 1000

## 2024-04-12 MED FILL — Thrombin For Soln 5000 Unit: CUTANEOUS | Qty: 5000 | Status: AC

## 2024-04-12 NOTE — Evaluation (Signed)
 Physical Therapy Evaluation Patient Details Name: Bradley Fisher MRN: [id number] DOB: Dec 05, 1966 Today's Date: 04/12/2024  History of Present Illness  Pt is a 58 yo male presenting to [facility] on 04/11/24 with chronic low back pain, working dx of lumbar radiculopathy. He is s/p Anterior retroperitoneal spine exposure for L4-L5 and L5-S1 ALIF on 04/11/24. PMH of DDD, HTN, seizures, OA, COPD, GERD, HLD.  Clinical Impression  Pt presents with admitting diagnosis above. Pt received standing with NTs upon arrival to room. +2 Min A to step pivot to chair with RW. Cues for back precautions when reaching back to sit down. Pt noted with poor recall of spinal precautions only able to recall 1/3 precautions. PTA pt reports he was independent. Patient will benefit from continued inpatient follow up therapy, <3 hours/day however pt is scheduled for additional surgery tomorrow and will likely need a re-eval on Saturday. PT will continue to follow.        If plan is discharge home, recommend the following: A little help with walking and/or transfers;A little help with bathing/dressing/bathroom;Assistance with cooking/housework;Direct supervision/assist for medications management;Assist for transportation;Help with stairs or ramp for entrance   Can travel by private vehicle   No    Equipment Recommendations Rolling walker (2 wheels);BSC/3in1  Recommendations for Other Services       Functional Status Assessment Patient has had a recent decline in their functional status and demonstrates the ability to make significant improvements in function in a reasonable and predictable amount of time.     Precautions / Restrictions Precautions Precautions: Back Precaution Booklet Issued: Yes (comment) Recall of Precautions/Restrictions: Impaired Precaution/Restrictions Comments: reviewed post op back precautions Required Braces or Orthoses: Spinal Brace Spinal Brace: Lumbar corset;Applied in supine  position Restrictions Weight Bearing Restrictions Per Provider Order: No      Mobility  Bed Mobility               General bed mobility comments: Up with nursing staff    Transfers Overall transfer level: Needs assistance Equipment used: Rolling walker (2 wheels) Transfers: Bed to chair/wheelchair/BSC     Step pivot transfers: +2 physical assistance, Min assist       General transfer comment: Standing with NTs upon arrival to room. +2 Min A to step pivot to chair. Cues for back precautions when reaching back to sit down.    Ambulation/Gait               General Gait Details: Deferred due to orders from surgeon for transfers to chair only.  Stairs            Wheelchair Mobility     Tilt Bed    Modified Rankin (Stroke Patients Only)       Balance Overall balance assessment: Needs assistance         Standing balance support: Bilateral upper extremity supported, During functional activity Standing balance-Leahy Scale: Poor Standing balance comment: Reliant on RW                             Pertinent Vitals/Pain Pain Assessment Pain Assessment: Faces Faces Pain Scale: Hurts even more Pain Location: abdominal incisions Pain Descriptors / Indicators: Sore, Aching    Home Living Family/patient expects to be discharged to:: Private residence Living Arrangements: Spouse/significant other Available Help at Discharge: Family;Available 24 hours/day Type of Home: House Home Access: Stairs to enter Entrance Stairs-Rails: Can reach both;Left;Right Entrance Stairs-Number of Steps: 6 front, 3 in  the back   Home Layout: One level Home Equipment: Hand held shower head;Shower seat      Prior Function Prior Level of Function : Independent/Modified Independent             Mobility Comments: Ind no AD ADLs Comments: ind     Extremity/Trunk Assessment   Upper Extremity Assessment Upper Extremity Assessment: Overall WFL for  tasks assessed    Lower Extremity Assessment Lower Extremity Assessment: Overall WFL for tasks assessed    Cervical / Trunk Assessment Cervical / Trunk Assessment: Back Surgery  Communication   Communication Communication: No apparent difficulties    Cognition Arousal: Alert Behavior During Therapy: WFL for tasks assessed/performed                             Following commands: Impaired Following commands impaired: Follows one step commands with increased time     Cueing Cueing Techniques: Verbal cues     General Comments General comments (skin integrity, edema, etc.): VSS    Exercises     Assessment/Plan    PT Assessment Patient needs continued PT services  PT Problem List Decreased strength;Decreased range of motion;Decreased activity tolerance;Decreased balance;Decreased mobility;Decreased coordination;Decreased knowledge of use of DME;Decreased safety awareness;Decreased knowledge of precautions;Cardiopulmonary status limiting activity       PT Treatment Interventions DME instruction;Gait training;Stair training;Functional mobility training;Therapeutic activities;Therapeutic exercise;Neuromuscular re-education;Balance training;Cognitive remediation;Patient/family education;Wheelchair mobility training    PT Goals (Current goals can be found in the Care Plan section)  Acute Rehab PT Goals Patient Stated Goal: to go home PT Goal Formulation: With patient Time For Goal Achievement: 04/26/24 Potential to Achieve Goals: Good    Frequency Min 5X/week     Co-evaluation               AM-PAC PT "6 Clicks" Mobility  Outcome Measure Help needed turning from your back to your side while in a flat bed without using bedrails?: A Little Help needed moving from lying on your back to sitting on the side of a flat bed without using bedrails?: A Little Help needed moving to and from a bed to a chair (including a wheelchair)?: A Little Help needed standing  up from a chair using your arms (e.g., wheelchair or bedside chair)?: A Little Help needed to walk in hospital room?: A Lot Help needed climbing 3-5 steps with a railing? : Total 6 Click Score: 15    End of Session Equipment Utilized During Treatment: Back brace Activity Tolerance: Patient tolerated treatment well Patient left: in chair;with call bell/phone within reach;with chair alarm set;with nursing/sitter in room Nurse Communication: Mobility status PT Visit Diagnosis: Other abnormalities of gait and mobility (R26.89)    Time: 1610-9604 PT Time Calculation (min) (ACUTE ONLY): 15 min   Charges:   PT Evaluation $PT Eval Moderate Complexity: 1 Mod   PT General Charges $$ ACUTE PT VISIT: 1 Visit         [name], PT, DPT Acute Rehab Services [phone]   Bradley Fisher 04/12/2024, 11:54 AM

## 2024-04-12 NOTE — Progress Notes (Signed)
 PT Cancellation Note  Patient Details Name: GRYFFIN MAGGART MRN: [id number] DOB: [birth year]   Cancelled Treatment:    Reason Eval/Treat Not Completed: Patient at procedure or test/unavailable (Pt working with OT and about to be transported to CT. Will follow later when back from CT.)   Cinthia Rodden 04/12/2024, 8:59 AM

## 2024-04-12 NOTE — Anesthesia Postprocedure Evaluation (Signed)
 Anesthesia Post Note  Patient: Bradley Fisher  Procedure(s) Performed: LUMBAR FOUR-FIVE, LUMBAR FIVE-SACRAL ONE ANTERIOR LUMBAR FUSION (Right: Abdomen) LUMBAR ONE-TWO, LUMBAR THREE-FOUR ANTERIOR LATERAL LUMBAR FUSION (Right: Flank) ABDOMINAL EXPOSURE     Patient location during evaluation: PACU Anesthesia Type: General Level of consciousness: awake and alert Pain management: pain level controlled Vital Signs Assessment: post-procedure vital signs reviewed and stable Respiratory status: spontaneous breathing, nonlabored ventilation, respiratory function stable and patient connected to nasal cannula oxygen Cardiovascular status: blood pressure returned to baseline and stable Postop Assessment: no apparent nausea or vomiting Anesthetic complications: no   No notable events documented.  Last Vitals:  Vitals:   04/12/24 1614 04/12/24 1928  BP: 136/77 (!) 116/91  Pulse: 93 84  Resp: 16 18  Temp: 36.7 C 36.4 C  SpO2: 98% 96%    Last Pain:  Vitals:   04/12/24 1928  TempSrc: Oral  PainSc:                  [name] [name]

## 2024-04-12 NOTE — Plan of Care (Signed)
 Has been sitting up in chair and in bed today.  Did attempt to get up unassisted and had to be reminded that he is not to be up without assist so he doesn't fall.  Does get confused and has to be reminded and redirected often then he is ok.    Problem: Education: Goal: Knowledge of General Education information will improve Description: Including pain rating scale, medication(s)/side effects and non-pharmacologic comfort measures Outcome: Progressing   Problem: Clinical Measurements: Goal: Will remain free from infection Outcome: Progressing   Problem: Activity: Goal: Risk for activity intolerance will decrease Outcome: Progressing   Problem: Nutrition: Goal: Adequate nutrition will be maintained Outcome: Progressing   Problem: Coping: Goal: Level of anxiety will decrease Outcome: Progressing   Problem: Pain Managment: Goal: General experience of comfort will improve and/or be controlled Outcome: Progressing   Problem: Safety: Goal: Ability to remain free from injury will improve Outcome: Progressing

## 2024-04-12 NOTE — Progress Notes (Signed)
 NAD  Sitting in chair Some weakness in right leg hip flexion Has not had any radiculopathy pains that he had preop Dressings c/d  CT T/L spine reviewed personally.  Instrumentation in good position  S/p L1-2, L3-4 DLIF, L4-5, L5-S1 ALIF - t10-pelvis posterior fusion tomorrow

## 2024-04-12 NOTE — Progress Notes (Signed)
 Urine output is 525. Foley removed.

## 2024-04-12 NOTE — Evaluation (Signed)
 Occupational Therapy Evaluation Patient Details Name: Bradley Fisher MRN: [id number] DOB: September 03, 1966 Today's Date: 04/12/2024   History of Present Illness   Pt is a 58 yo male presenting to [facility] on 04/11/24 with chronic low back pain, working dx of lumbar radiculopathy. He is s/p Anterior retroperitoneal spine exposure for L4-L5 and L5-S1 ALIF on 04/11/24. PMH of DDD, HTN, seizures, OA, COPD, GERD, HLD.     Clinical Impressions Pt admitted for above, PTA pt lived with spouse and reports ambulating no AD and being independent with ADLs. Pt currently presenting with decreased knowledge of POB precautions, needing min A +2 to stand but session limited by transport to CT. Pt completing bed mobility with min to mod A with assist for brace management, educated him on back precautions, currently needs Max A to setup A for ADLs. OT to continue following pt acutely to educate on compensatory strategies prn and progress pt as able. Patient will benefit from continued inpatient follow up therapy, <3 hours/day, will further assess need for post acute rehab pending other back surgeries.      If plan is discharge home, recommend the following:   A little help with bathing/dressing/bathroom;Assistance with cooking/housework;Assist for transportation;Help with stairs or ramp for entrance     Functional Status Assessment   Patient has had a recent decline in their functional status and demonstrates the ability to make significant improvements in function in a reasonable and predictable amount of time.     Equipment Recommendations   Other (comment) (RW)     Recommendations for Other Services         Precautions/Restrictions   Precautions Precautions: Back Precaution Booklet Issued: Yes (comment) Recall of Precautions/Restrictions: Impaired Precaution/Restrictions Comments: reviewed post op back precautions Required Braces or Orthoses: Spinal Brace Spinal Brace: Lumbar corset;Applied in  supine position Restrictions Weight Bearing Restrictions Per Provider Order: No     Mobility Bed Mobility Overal bed mobility: Needs Assistance Bed Mobility: Rolling, Sidelying to Sit, Sit to Sidelying Rolling: Min assist Sidelying to sit: Mod assist     Sit to sidelying: Min assist General bed mobility comments: mod A to raise trunk, min A to return BLEs to sidelying.    Transfers Overall transfer level: Needs assistance Equipment used: Rolling walker (2 wheels) Transfers: Sit to/from Stand Sit to Stand: Min assist, +2 physical assistance           General transfer comment: lateral steps at bedside with CGA      Balance Overall balance assessment:  (to be further assessed)                                         ADL either performed or assessed with clinical judgement   ADL Overall ADL's : Needs assistance/impaired Eating/Feeding: Independent;Sitting   Grooming: Sitting;Set up   Upper Body Bathing: Sitting;Set up   Lower Body Bathing: Moderate assistance;Sitting/lateral leans   Upper Body Dressing : Sitting;Set up   Lower Body Dressing: Sitting/lateral leans;Maximal assistance   Toilet Transfer: Minimal assistance;+2 for physical assistance;Stand-pivot Toilet Transfer Details (indicate cue type and reason): limited activity restrictions per MD orders. only transfer to recliner. Toileting- Clothing Manipulation and Hygiene: Sit to/from stand;Minimal assistance       Functional mobility during ADLs: Minimal assistance;Rolling walker (2 wheels);+2 for physical assistance (stand at bedside) General ADL Comments: Educated pt on POB precautions.     Vision Baseline  Vision/History: 1 Wears glasses       Perception         Praxis         Pertinent Vitals/Pain Pain Assessment Pain Assessment: Faces Faces Pain Scale: Hurts even more Pain Location: abdominal incisions Pain Descriptors / Indicators: Sore, Aching Pain Intervention(s):  Limited activity within patient's tolerance, Monitored during session, Repositioned     Extremity/Trunk Assessment Upper Extremity Assessment Upper Extremity Assessment: Overall WFL for tasks assessed   Lower Extremity Assessment Lower Extremity Assessment: Defer to PT evaluation   Cervical / Trunk Assessment Cervical / Trunk Assessment: Back Surgery   Communication Communication Communication: No apparent difficulties   Cognition Arousal: Alert Behavior During Therapy: WFL for tasks assessed/performed Cognition: No family/caregiver present to determine baseline                               Following commands: Impaired Following commands impaired: Follows one step commands with increased time     Cueing  General Comments   Cueing Techniques: Verbal cues  incisions c/d/i   Exercises     Shoulder Instructions      Home Living Family/patient expects to be discharged to:: Private residence Living Arrangements: Spouse/significant other Available Help at Discharge: Family;Available 24 hours/day Type of Home: House Home Access: Stairs to enter [employer] of Steps: 6 front, 3 in the back Entrance Stairs-Rails: Can reach both;Left;Right Home Layout: One level     Bathroom Shower/Tub: Chief Strategy Officer: Standard     Home Equipment: Hand held shower head;Shower seat          Prior Functioning/Environment Prior Level of Function : Independent/Modified Independent             Mobility Comments: Ind no AD ADLs Comments: ind    OT Problem List: Decreased knowledge of precautions;Decreased knowledge of use of DME or AE;Impaired balance (sitting and/or standing);Pain   OT Treatment/Interventions: Self-care/ADL training;Patient/family education;Therapeutic exercise;Balance training;Therapeutic activities;DME and/or AE instruction      OT Goals(Current goals can be found in the care plan section)   Acute Rehab OT  Goals Patient Stated Goal: To go home OT Goal Formulation: With patient Time For Goal Achievement: 04/26/24 Potential to Achieve Goals: Good ADL Goals Pt Will Perform Lower Body Bathing: with set-up;sitting/lateral leans Pt Will Perform Lower Body Dressing: with set-up;sitting/lateral leans;with adaptive equipment Pt Will Transfer to Toilet: with supervision;bedside commode;stand pivot transfer Pt Will Perform Toileting - Clothing Manipulation and hygiene: with modified independence;sitting/lateral leans Additional ADL Goal #1: Pt will demonstrate understanding of 3/3 back precautions during functional activity.   OT Frequency:  Min 2X/week    Co-evaluation              AM-PAC OT "6 Clicks" Daily Activity     Outcome Measure Help from another person eating meals?: None Help from another person taking care of personal grooming?: A Little Help from another person toileting, which includes using toliet, bedpan, or urinal?: A Little Help from another person bathing (including washing, rinsing, drying)?: A Lot Help from another person to put on and taking off regular upper body clothing?: A Little Help from another person to put on and taking off regular lower body clothing?: A Lot 6 Click Score: 17   End of Session Equipment Utilized During Treatment: Gait belt;Rolling walker (2 wheels) Nurse Communication: Mobility status  Activity Tolerance: Other (comment) (limited by CT in room for transport) Patient  left:    OT Visit Diagnosis: Pain;Unsteadiness on feet (R26.81);Other abnormalities of gait and mobility (R26.89) Pain - Right/Left:  (back)                Time: 1610-9604 OT Time Calculation (min): 21 min Charges:  OT General Charges $OT Visit: 1 Visit OT Evaluation $OT Eval Moderate Complexity: 1 Mod  04/12/2024  AB, OTR/L  Acute Rehabilitation Services  Office: [phone]   Jorene New 04/12/2024, 10:06 AM

## 2024-04-12 NOTE — TOC CM/SW Note (Signed)
 Transition of Care [facility]) - Inpatient Brief Assessment   Patient Details  Name: Bradley Fisher MRN: [id number] Date of Birth: February 17, 1966  Transition of Care [facility]) CM/SW Contact:    Tandy Fam, LCSW Phone Number: 04/12/2024, 1:00 PM   Clinical Narrative:   Patient from home with spouse, previously independent, s/p spinal surgery with additional surgery planned for tomorrow. CSW to follow for disposition once all surgical intervention is completed.    Transition of Care Asessment: Insurance and Status: Insurance coverage has been reviewed Patient has primary care physician: Yes Home environment has been reviewed: Home with spouse   Prior/Current Home Services: No current home services Social Drivers of Health Review: SDOH reviewed no interventions necessary Readmission risk has been reviewed: Yes Transition of care needs: transition of care needs identified, TOC will continue to follow

## 2024-04-12 NOTE — Progress Notes (Addendum)
  Progress Note    04/12/2024 7:33 AM 1 Day Post-Op  Subjective: says he is a little sore around his incision, otherwise no complaints. Wants to call his wife   Vitals:   04/11/24 2310 04/12/24 0425  BP: (!) 161/89 (!) 162/84  Pulse: 95 63  Resp: 15 18  Temp: 98 F (36.7 C) 98 F (36.7 C)  SpO2: 97% 96%    Physical Exam: General:  resting comfortably Lungs:  nonlabored Incisions:  abdominal incision intact, dry, and bandaged with honeycomb Extremities:  moving all extremities equally, able to raise legs off the bed. 2+ PT pulses   CBC    Component Value Date/Time   WBC 7.0 04/03/2024 0930   RBC 4.83 04/03/2024 0930   HGB 10.5 (L) 04/11/2024 1101   HGB 14.4 01/22/2021 1327   HCT 31.0 (L) 04/11/2024 1101   HCT 40.7 01/22/2021 1327   PLT 211 04/03/2024 0930   PLT 237 01/22/2021 1327   MCV 90.1 04/03/2024 0930   MCV 85 01/22/2021 1327   MCH 31.3 04/03/2024 0930   MCHC 34.7 04/03/2024 0930   RDW 12.6 04/03/2024 0930   RDW 12.8 01/22/2021 1327   LYMPHSABS 2.4 01/22/2021 1327   EOSABS 0.2 01/22/2021 1327   BASOSABS 0.1 01/22/2021 1327    BMET    Component Value Date/Time   NA 135 04/11/2024 1101   NA 138 01/22/2021 1327   K 4.6 04/11/2024 1101   CL 101 04/03/2024 0930   CO2 30 04/03/2024 0930   GLUCOSE 83 04/03/2024 0930   BUN 11 04/03/2024 0930   BUN 14 01/22/2021 1327   CREATININE 0.81 04/03/2024 0930   CALCIUM  9.8 04/03/2024 0930   GFRNONAA >60 04/03/2024 0930   GFRAA 121 01/22/2021 1327    INR No results found for: "INR"   Intake/Output Summary (Last 24 hours) at 04/12/2024 1610 Last data filed at 04/12/2024 0515 Gross per 24 hour  Intake 3213 ml  Output 2700 ml  Net 513 ml      Assessment/Plan:  58 y.o. male is 1 day post op, s/p: ALIF   -He is doing well this morning without any complaints -Abdominal incision is intact with honeycomb dressing. No evidence of hematoma -BLE warm and well perfused with 2+ PT pulses -Able to move his legs  equally and raise his legs off the bed -Stable disposition from vascular perspective, will follow as needed   Deneise Finlay, PA-C [facility] [phone] 04/12/2024 7:33 AM  I have seen and evaluated the patient. I agree with the PA note as documented above.  Postop day 1 status post anterior spine exposure for L4-L5 and L5-S1 ALIF.  Abdomen with appropriate postop incisional tenderness.  Bilateral PT pulses palpable.  Overall looks good.  Hemodynamics stable.  Back to the OR tomorrow for posterior instrumentation.  We are available as needed.  Young Hensen, MD [facility] [place] Office: [phone]

## 2024-04-12 NOTE — Anesthesia Preprocedure Evaluation (Addendum)
 Anesthesia Evaluation  Patient identified by MRN, date of birth, ID band Patient awake    Reviewed: Allergy & Precautions, NPO status , Patient's Chart, lab work & pertinent test results  History of Anesthesia Complications Negative for: history of anesthetic complications  Airway Mallampati: I  TM Distance: >3 FB Neck ROM: Full    Dental  (+) Edentulous Upper, Edentulous Lower   Pulmonary COPD,  COPD inhaler, Current Smoker and Patient abstained from smoking.   breath sounds clear to auscultation       Cardiovascular hypertension, Pt. on medications (-) angina  Rhythm:Regular Rate:Normal     Neuro/Psych Seizures -, Well Controlled,    Depression       GI/Hepatic Neg liver ROS,GERD  Controlled,,  Endo/Other  negative endocrine ROS    Renal/GU negative Renal ROS     Musculoskeletal  (+) Arthritis ,    Abdominal   Peds  Hematology  (+) Blood dyscrasia, anemia Hb 10.5, plt 211k   Anesthesia Other Findings   Reproductive/Obstetrics                              Anesthesia Physical Anesthesia Plan  ASA: 3  Anesthesia Plan: General   Post-op Pain Management: Tylenol  PO (pre-op)*   Induction: Intravenous  PONV Risk Score and Plan: 1 and Ondansetron  and Dexamethasone   Airway Management Planned: Oral ETT  Additional Equipment: None  Intra-op Plan:   Post-operative Plan: Extubation in OR  Informed Consent: I have reviewed the patients History and Physical, chart, labs and discussed the procedure including the risks, benefits and alternatives for the proposed anesthesia with the patient or authorized representative who has indicated his/her understanding and acceptance.       Plan Discussed with: CRNA and Surgeon  Anesthesia Plan Comments:          Anesthesia Quick Evaluation

## 2024-04-13 ENCOUNTER — Encounter (HOSPITAL_COMMUNITY)
Admission: RE | Disposition: A | Discharge status: Rehabilitation Facility | Payer: Self-pay | Source: Ambulatory Visit | Attending: Neurosurgery

## 2024-04-13 ENCOUNTER — Inpatient Hospital Stay (HOSPITAL_COMMUNITY): Payer: Self-pay

## 2024-04-13 ENCOUNTER — Other Ambulatory Visit: Payer: Self-pay

## 2024-04-13 ENCOUNTER — Encounter (HOSPITAL_COMMUNITY): Payer: Self-pay | Admitting: Neurosurgery

## 2024-04-13 ENCOUNTER — Inpatient Hospital Stay (HOSPITAL_COMMUNITY): Admission: RE | Admit: 2024-04-13 | Source: Home / Self Care

## 2024-04-13 ENCOUNTER — Inpatient Hospital Stay (HOSPITAL_COMMUNITY)

## 2024-04-13 DIAGNOSIS — J449 Chronic obstructive pulmonary disease, unspecified: Secondary | ICD-10-CM | POA: Diagnosis not present

## 2024-04-13 DIAGNOSIS — F17210 Nicotine dependence, cigarettes, uncomplicated: Secondary | ICD-10-CM | POA: Diagnosis not present

## 2024-04-13 DIAGNOSIS — M4156 Other secondary scoliosis, lumbar region: Secondary | ICD-10-CM | POA: Diagnosis not present

## 2024-04-13 DIAGNOSIS — I10 Essential (primary) hypertension: Secondary | ICD-10-CM

## 2024-04-13 HISTORY — PX: APPLICATION OF ROBOTIC ASSISTANCE FOR SPINAL PROCEDURE: SHX6753

## 2024-04-13 HISTORY — PX: APPLICATION OF CELL SAVER: SHX7529

## 2024-04-13 LAB — POCT I-STAT 7, (LYTES, BLD GAS, ICA,H+H)
Acid-base deficit: 1 mmol/L (ref 0.0–2.0)
Acid-base deficit: 3 mmol/L — ABNORMAL HIGH (ref 0.0–2.0)
Bicarbonate: 23.3 mmol/L (ref 20.0–28.0)
Bicarbonate: 21.7 mmol/L (ref 20.0–28.0)
Calcium, Ion: 1.25 mmol/L (ref 1.15–1.40)
Calcium, Ion: 1.25 mmol/L (ref 1.15–1.40)
HCT: 29 % — ABNORMAL LOW (ref 39.0–52.0)
HCT: 23 % — ABNORMAL LOW (ref 39.0–52.0)
Hemoglobin: 9.9 g/dL — ABNORMAL LOW (ref 13.0–17.0)
Hemoglobin: 7.8 g/dL — ABNORMAL LOW (ref 13.0–17.0)
O2 Saturation: 92 %
O2 Saturation: 99 %
Patient temperature: 36
Patient temperature: 36.7
Potassium: 4.1 mmol/L (ref 3.5–5.1)
Potassium: 4.2 mmol/L (ref 3.5–5.1)
Sodium: 135 mmol/L (ref 135–145)
Sodium: 138 mmol/L (ref 135–145)
TCO2: 24 mmol/L (ref 22–32)
TCO2: 23 mmol/L (ref 22–32)
pCO2 arterial: 36.6 mmHg (ref 32–48)
pCO2 arterial: 37.3 mmHg (ref 32–48)
pH, Arterial: 7.407 (ref 7.35–7.45)
pH, Arterial: 7.371 (ref 7.35–7.45)
pO2, Arterial: 59 mmHg — ABNORMAL LOW (ref 83–108)
pO2, Arterial: 157 mmHg — ABNORMAL HIGH (ref 83–108)

## 2024-04-13 LAB — PREPARE RBC (CROSSMATCH)

## 2024-04-13 SURGERY — POSTERIOR SPINAL FUSION, 8 LEVELS
Anesthesia: General

## 2024-04-13 MED ORDER — VASOPRESSIN 20 UNIT/ML IV SOLN
INTRAVENOUS | Status: AC
  Filled 2024-04-13: qty 1

## 2024-04-13 MED ORDER — CHLORHEXIDINE GLUCONATE 0.12 % MT SOLN
OROMUCOSAL | Status: AC
  Administered 2024-04-13: 15 mL via OROMUCOSAL
  Filled 2024-04-13: qty 15

## 2024-04-13 MED ORDER — CEFAZOLIN SODIUM 1 G IJ SOLR
INTRAMUSCULAR | Status: AC
  Filled 2024-04-13: qty 20

## 2024-04-13 MED ORDER — CHLORHEXIDINE GLUCONATE 0.12 % MT SOLN: 15.0000 mL | Freq: Once | OROMUCOSAL | Status: AC

## 2024-04-13 MED ORDER — HYDROMORPHONE HCL 1 MG/ML IJ SOLN
INTRAMUSCULAR | Status: AC
  Filled 2024-04-13: qty 0.5

## 2024-04-13 MED ORDER — PHENYLEPHRINE 80 MCG/ML (10ML) SYRINGE FOR IV PUSH (FOR BLOOD PRESSURE SUPPORT)
PREFILLED_SYRINGE | INTRAVENOUS | Status: AC
  Filled 2024-04-13: qty 20

## 2024-04-13 MED ORDER — ONDANSETRON HCL 4 MG/2ML IJ SOLN
INTRAMUSCULAR | Status: AC
  Filled 2024-04-13: qty 2

## 2024-04-13 MED ORDER — FENTANYL CITRATE (PF) 250 MCG/5ML IJ SOLN
INTRAMUSCULAR | Status: AC
Start: 2024-04-13 — End: ?
  Filled 2024-04-13: qty 5

## 2024-04-13 MED ORDER — THROMBIN 5000 UNITS EX KIT
PACK | CUTANEOUS | Status: AC
  Filled 2024-04-13: qty 1

## 2024-04-13 MED ORDER — SUGAMMADEX SODIUM 200 MG/2ML IV SOLN
INTRAVENOUS | Status: DC | PRN
  Administered 2024-04-13: 200 mg via INTRAVENOUS
  Administered 2024-04-13: 100 mg via INTRAVENOUS

## 2024-04-13 MED ORDER — ORAL CARE MOUTH RINSE: 15.0000 mL | Freq: Once | OROMUCOSAL | Status: AC

## 2024-04-13 MED ORDER — FENTANYL CITRATE (PF) 250 MCG/5ML IJ SOLN
INTRAMUSCULAR | Status: DC | PRN
  Administered 2024-04-13 (×2): 100 ug via INTRAVENOUS
  Administered 2024-04-13: 25 ug via INTRAVENOUS
  Administered 2024-04-13: 100 ug via INTRAVENOUS
  Administered 2024-04-13 (×3): 50 ug via INTRAVENOUS
  Administered 2024-04-13: 25 ug via INTRAVENOUS

## 2024-04-13 MED ORDER — DEXAMETHASONE SODIUM PHOSPHATE 10 MG/ML IJ SOLN
INTRAMUSCULAR | Status: DC | PRN
  Administered 2024-04-13: 10 mg via INTRAVENOUS

## 2024-04-13 MED ORDER — HYDROMORPHONE HCL 1 MG/ML IJ SOLN
0.2500 mg | INTRAMUSCULAR | Status: DC | PRN
  Administered 2024-04-13 (×4): 0.5 mg via INTRAVENOUS

## 2024-04-13 MED ORDER — ENOXAPARIN SODIUM 40 MG/0.4ML IJ SOSY
40.0000 mg | PREFILLED_SYRINGE | INTRAMUSCULAR | Status: DC
  Administered 2024-04-14 – 2024-04-23 (×9): 40 mg via SUBCUTANEOUS
  Filled 2024-04-13 (×10): qty 0.4

## 2024-04-13 MED ORDER — ACETAMINOPHEN 10 MG/ML IV SOLN
INTRAVENOUS | Status: DC | PRN
Start: 2024-04-13 — End: 2024-04-13
  Administered 2024-04-13: 1000 mg via INTRAVENOUS

## 2024-04-13 MED ORDER — MIDAZOLAM HCL 2 MG/2ML IJ SOLN
INTRAMUSCULAR | Status: DC | PRN
  Administered 2024-04-13: 2 mg via INTRAVENOUS

## 2024-04-13 MED ORDER — MIDAZOLAM HCL 2 MG/2ML IJ SOLN: 0.5000 mg | Freq: Once | INTRAMUSCULAR | Status: DC | PRN

## 2024-04-13 MED ORDER — VANCOMYCIN HCL 1000 MG IV SOLR
INTRAVENOUS | Status: AC
  Filled 2024-04-13: qty 20

## 2024-04-13 MED ORDER — PROPOFOL 10 MG/ML IV BOLUS
INTRAVENOUS | Status: AC
  Filled 2024-04-13: qty 20

## 2024-04-13 MED ORDER — SODIUM CHLORIDE (PF) 0.9 % IJ SOLN
INTRAMUSCULAR | Status: AC
  Filled 2024-04-13: qty 20

## 2024-04-13 MED ORDER — 0.9 % SODIUM CHLORIDE (POUR BTL) OPTIME
TOPICAL | Status: DC | PRN
  Administered 2024-04-13: 1000 mL

## 2024-04-13 MED ORDER — CEFAZOLIN SODIUM-DEXTROSE 2-4 GM/100ML-% IV SOLN
2.0000 g | Freq: Four times a day (QID) | INTRAVENOUS | Status: DC
  Administered 2024-04-13 – 2024-04-14 (×4): 2 g via INTRAVENOUS
  Filled 2024-04-13 (×4): qty 100

## 2024-04-13 MED ORDER — SODIUM CHLORIDE 0.9% FLUSH
3.0000 mL | Freq: Two times a day (BID) | INTRAVENOUS | Status: DC
  Administered 2024-04-14 – 2024-04-23 (×18): 3 mL via INTRAVENOUS

## 2024-04-13 MED ORDER — HYDROMORPHONE HCL 1 MG/ML IJ SOLN
INTRAMUSCULAR | Status: AC
  Filled 2024-04-13: qty 2

## 2024-04-13 MED ORDER — LIDOCAINE-EPINEPHRINE 1 %-1:100000 IJ SOLN
INTRAMUSCULAR | Status: AC
  Filled 2024-04-13: qty 1

## 2024-04-13 MED ORDER — PROPOFOL 10 MG/ML IV BOLUS
INTRAVENOUS | Status: DC | PRN
  Administered 2024-04-13: 80 mg via INTRAVENOUS

## 2024-04-13 MED ORDER — BUPIVACAINE LIPOSOME 1.3 % IJ SUSP
INTRAMUSCULAR | Status: DC | PRN
  Administered 2024-04-13: 50 mL via SURGICAL_CAVITY

## 2024-04-13 MED ORDER — ORAL CARE MOUTH RINSE: 15.0000 mL | OROMUCOSAL | Status: DC | PRN

## 2024-04-13 MED ORDER — MIDAZOLAM HCL 2 MG/2ML IJ SOLN
INTRAMUSCULAR | Status: AC
  Filled 2024-04-13: qty 2

## 2024-04-13 MED ORDER — PROPOFOL 1000 MG/100ML IV EMUL
INTRAVENOUS | Status: AC
  Filled 2024-04-13: qty 100

## 2024-04-13 MED ORDER — CHLORHEXIDINE GLUCONATE CLOTH 2 % EX PADS: 6.0000 | MEDICATED_PAD | Freq: Every day | CUTANEOUS | Status: DC

## 2024-04-13 MED ORDER — SUFENTANIL CITRATE 50 MCG/ML IV SOLN
0.2500 ug/kg/h | INTRAVENOUS | Status: DC
  Administered 2024-04-13: .1 ug/kg/h via INTRAVENOUS
  Filled 2024-04-13: qty 1

## 2024-04-13 MED ORDER — FENTANYL CITRATE (PF) 250 MCG/5ML IJ SOLN
INTRAMUSCULAR | Status: AC
  Filled 2024-04-13: qty 5

## 2024-04-13 MED ORDER — PHENYLEPHRINE HCL-NACL 20-0.9 MG/250ML-% IV SOLN
INTRAVENOUS | Status: DC | PRN
  Administered 2024-04-13: 30 ug/min via INTRAVENOUS

## 2024-04-13 MED ORDER — ROCURONIUM BROMIDE 10 MG/ML (PF) SYRINGE
PREFILLED_SYRINGE | INTRAVENOUS | Status: AC
  Filled 2024-04-13: qty 20

## 2024-04-13 MED ORDER — ACETAMINOPHEN 500 MG PO TABS
1000.0000 mg | ORAL_TABLET | Freq: Once | ORAL | Status: AC
  Administered 2024-04-13: 1000 mg via ORAL
  Filled 2024-04-13: qty 2

## 2024-04-13 MED ORDER — HYDROMORPHONE HCL 1 MG/ML IJ SOLN
INTRAMUSCULAR | Status: DC | PRN
  Administered 2024-04-13 (×2): .25 mg via INTRAVENOUS
  Administered 2024-04-13: .5 mg via INTRAVENOUS

## 2024-04-13 MED ORDER — TRANEXAMIC ACID 1000 MG/10ML IV SOLN
2000.0000 mg | Freq: Once | INTRAVENOUS | Status: AC
  Administered 2024-04-13: 2000 mg via TOPICAL
  Filled 2024-04-13: qty 20

## 2024-04-13 MED ORDER — LIDOCAINE-EPINEPHRINE 1 %-1:100000 IJ SOLN
INTRAMUSCULAR | Status: DC | PRN
  Administered 2024-04-13: 15 mL

## 2024-04-13 MED ORDER — SODIUM CHLORIDE 0.9 % IV SOLN: 250.0000 mL | INTRAVENOUS | Status: AC

## 2024-04-13 MED ORDER — ROCURONIUM BROMIDE 10 MG/ML (PF) SYRINGE
PREFILLED_SYRINGE | INTRAVENOUS | Status: DC | PRN
  Administered 2024-04-13: 10 mg via INTRAVENOUS
  Administered 2024-04-13: 30 mg via INTRAVENOUS
  Administered 2024-04-13: 20 mg via INTRAVENOUS
  Administered 2024-04-13: 70 mg via INTRAVENOUS
  Administered 2024-04-13: 30 mg via INTRAVENOUS
  Administered 2024-04-13: 20 mg via INTRAVENOUS

## 2024-04-13 MED ORDER — ONDANSETRON HCL 4 MG/2ML IJ SOLN
INTRAMUSCULAR | Status: DC | PRN
  Administered 2024-04-13: 4 mg via INTRAVENOUS

## 2024-04-13 MED ORDER — LACTATED RINGERS IV SOLN: INTRAVENOUS | Status: DC

## 2024-04-13 MED ORDER — LIDOCAINE 2% (20 MG/ML) 5 ML SYRINGE
INTRAMUSCULAR | Status: DC | PRN
  Administered 2024-04-13: 25 mg via INTRAVENOUS

## 2024-04-13 MED ORDER — ACETAMINOPHEN 10 MG/ML IV SOLN
INTRAVENOUS | Status: AC
  Filled 2024-04-13: qty 100

## 2024-04-13 MED ORDER — THROMBIN 5000 UNITS EX SOLR: OROMUCOSAL | Status: DC | PRN

## 2024-04-13 MED ORDER — PHENYLEPHRINE HCL-NACL 20-0.9 MG/250ML-% IV SOLN
INTRAVENOUS | Status: AC
  Filled 2024-04-13: qty 250

## 2024-04-13 MED ORDER — OXYCODONE HCL 5 MG/5ML PO SOLN: 5.0000 mg | Freq: Once | ORAL | Status: DC | PRN

## 2024-04-13 MED ORDER — BUPIVACAINE HCL (PF) 0.5 % IJ SOLN
INTRAMUSCULAR | Status: AC
  Filled 2024-04-13: qty 30

## 2024-04-13 MED ORDER — DEXMEDETOMIDINE HCL IN NACL 80 MCG/20ML IV SOLN
INTRAVENOUS | Status: DC | PRN
Start: 2024-04-13 — End: 2024-04-13
  Administered 2024-04-13 (×3): 4 ug via INTRAVENOUS

## 2024-04-13 MED ORDER — BUPIVACAINE LIPOSOME 1.3 % IJ SUSP
INTRAMUSCULAR | Status: AC
  Filled 2024-04-13: qty 20

## 2024-04-13 MED ORDER — DEXAMETHASONE SODIUM PHOSPHATE 10 MG/ML IJ SOLN
INTRAMUSCULAR | Status: AC
  Filled 2024-04-13: qty 1

## 2024-04-13 MED ORDER — VANCOMYCIN HCL 1000 MG IV SOLR
INTRAVENOUS | Status: DC | PRN
  Administered 2024-04-13: 1000 mg via TOPICAL

## 2024-04-13 MED ORDER — PHENYLEPHRINE 80 MCG/ML (10ML) SYRINGE FOR IV PUSH (FOR BLOOD PRESSURE SUPPORT)
PREFILLED_SYRINGE | INTRAVENOUS | Status: DC | PRN
  Administered 2024-04-13: 80 ug via INTRAVENOUS
  Administered 2024-04-13: 160 ug via INTRAVENOUS
  Administered 2024-04-13 (×2): 80 ug via INTRAVENOUS
  Administered 2024-04-13 (×2): 160 ug via INTRAVENOUS
  Administered 2024-04-13: 80 ug via INTRAVENOUS

## 2024-04-13 MED ORDER — HYDROMORPHONE HCL 1 MG/ML IJ SOLN
INTRAMUSCULAR | Status: AC
Start: 2024-04-13 — End: ?
  Filled 2024-04-13: qty 0.5

## 2024-04-13 MED ORDER — SODIUM CHLORIDE 0.9% FLUSH: 3.0000 mL | INTRAVENOUS | Status: DC | PRN

## 2024-04-13 MED ORDER — ALBUMIN HUMAN 5 % IV SOLN: INTRAVENOUS | Status: DC | PRN

## 2024-04-13 MED ORDER — NOREPINEPHRINE 4 MG/250ML-% IV SOLN
INTRAVENOUS | Status: AC
  Filled 2024-04-13: qty 250

## 2024-04-13 MED ORDER — OXYCODONE HCL 5 MG PO TABS: 5.0000 mg | ORAL_TABLET | Freq: Once | ORAL | Status: DC | PRN

## 2024-04-13 SURGICAL SUPPLY — 84 items
BAG COUNTER SPONGE SURGICOUNT (BAG) ×2 IMPLANT
BAND RUBBER #18 3X1/16 STRL (MISCELLANEOUS) ×2 IMPLANT
BASKET BONE COLLECTION (BASKET) ×1 IMPLANT
BIT DRILL LONG 3.0X30 (BIT) IMPLANT
BIT DRILL LONG 3X80 (BIT) IMPLANT
BIT DRILL LONG 4X80 (BIT) IMPLANT
BIT DRILL SHORT 3.0X30 (BIT) IMPLANT
BIT DRILL SHORT 3X80 (BIT) IMPLANT
BIT DRILL TWIST 3X30 (BIT) IMPLANT
BLADE BONE MILL MEDIUM (MISCELLANEOUS) IMPLANT
BLADE CLIPPER SURG (BLADE) IMPLANT
BLADE SURG 11 STRL SS (BLADE) ×1 IMPLANT
BUR MATCHSTICK NEURO 3.0 LAGG (BURR) ×1 IMPLANT
BUR PRECISION FLUTE 5.0 (BURR) ×1 IMPLANT
CANISTER SUCT 3000ML PPV (MISCELLANEOUS) ×1 IMPLANT
CNTNR URN SCR LID CUP LEK RST (MISCELLANEOUS) ×1 IMPLANT
COVER BACK TABLE 60X90IN (DRAPES) ×1 IMPLANT
DERMABOND ADVANCED .7 DNX12 (GAUZE/BANDAGES/DRESSINGS) ×2 IMPLANT
DRAIN JACKSON PRATT 10MM FLAT (MISCELLANEOUS) IMPLANT
DRAPE 3/4 80X56 (DRAPES) ×1 IMPLANT
DRAPE C-ARM 42X72 X-RAY (DRAPES) ×1 IMPLANT
DRAPE C-ARMOR (DRAPES) ×1 IMPLANT
DRAPE LAPAROTOMY 100X72X124 (DRAPES) ×1 IMPLANT
DRAPE MICROSCOPE SLANT 54X150 (MISCELLANEOUS) IMPLANT
DRAPE SHEET LG 3/4 BI-LAMINATE (DRAPES) ×1 IMPLANT
DRSG OPSITE 4X5.5 SM (GAUZE/BANDAGES/DRESSINGS) IMPLANT
DRSG OPSITE POSTOP 4X10 (GAUZE/BANDAGES/DRESSINGS) IMPLANT
DRSG OPSITE POSTOP 4X6 (GAUZE/BANDAGES/DRESSINGS) IMPLANT
DURAPREP 26ML APPLICATOR (WOUND CARE) ×1 IMPLANT
ELECTRODE BLDE 4.0 EZ CLN MEGD (MISCELLANEOUS) IMPLANT
ELECTRODE BLDE INSULATED 6.5IN (ELECTROSURGICAL) ×1 IMPLANT
ELECTRODE REM PT RTRN 9FT ADLT (ELECTROSURGICAL) ×1 IMPLANT
EVACUATOR SILICONE 100CC (DRAIN) IMPLANT
GAUZE 4X4 16PLY ~~LOC~~+RFID DBL (SPONGE) IMPLANT
GAUZE SPONGE 4X4 12PLY STRL (GAUZE/BANDAGES/DRESSINGS) IMPLANT
GLOVE BIO SURGEON STRL SZ7 (GLOVE) ×4 IMPLANT
GLOVE BIOGEL PI IND STRL 7.5 (GLOVE) ×3 IMPLANT
GLOVE ECLIPSE 7.5 STRL STRAW (GLOVE) ×2 IMPLANT
GLOVE EXAM NITRILE XL STR (GLOVE) IMPLANT
GOWN STRL REUS W/ TWL LRG LVL3 (GOWN DISPOSABLE) ×1 IMPLANT
GOWN STRL REUS W/ TWL XL LVL3 (GOWN DISPOSABLE) ×3 IMPLANT
GOWN STRL REUS W/TWL 2XL LVL3 (GOWN DISPOSABLE) IMPLANT
GRAFT BN 10X1XDBM MAGNIFUSE (Bone Implant) IMPLANT
HEMOSTAT POWDER KIT SURGIFOAM (HEMOSTASIS) ×1 IMPLANT
KIT BASIN OR (CUSTOM PROCEDURE TRAY) ×1 IMPLANT
KIT SPINE MAZOR X ROBO DISP (MISCELLANEOUS) ×1 IMPLANT
KIT TURNOVER KIT B (KITS) ×1 IMPLANT
MARKER SPHERE PSV REFLC NDI (MISCELLANEOUS) IMPLANT
NDL HYPO 18GX1.5 BLUNT FILL (NEEDLE) IMPLANT
NDL HYPO 21X1.5 SAFETY (NEEDLE) ×1 IMPLANT
NDL HYPO 22X1.5 SAFETY MO (MISCELLANEOUS) ×1 IMPLANT
NDL SPNL 18GX3.5 QUINCKE PK (NEEDLE) IMPLANT
NEEDLE HYPO 18GX1.5 BLUNT FILL (NEEDLE) IMPLANT
NEEDLE HYPO 21X1.5 SAFETY (NEEDLE) ×1 IMPLANT
NEEDLE HYPO 22X1.5 SAFETY MO (MISCELLANEOUS) ×1 IMPLANT
NEEDLE SPNL 18GX3.5 QUINCKE PK (NEEDLE) IMPLANT
NS IRRIG 1000ML POUR BTL (IV SOLUTION) ×1 IMPLANT
PACK LAMINECTOMY NEURO (CUSTOM PROCEDURE TRAY) ×1 IMPLANT
PAD ARMBOARD POSITIONER FOAM (MISCELLANEOUS) ×3 IMPLANT
PATTIES SURGICAL .5 X.5 (GAUZE/BANDAGES/DRESSINGS) ×2 IMPLANT
PATTIES SURGICAL 1X1 (DISPOSABLE) ×2 IMPLANT
PIN HEAD 2.5X60MM (PIN) IMPLANT
ROD EXP UNID 5.5 4+ (Rod) IMPLANT
SCREW BS CNCMA S T/C 8.5X90 (Screw) IMPLANT
SCREW SCHANZ SA 4.0MM (MISCELLANEOUS) IMPLANT
SCREW SET SOLERA TI5.5 (Screw) IMPLANT
SCREW SOLERA 45X5.5XMA NS SPNE (Screw) IMPLANT
SCREW SOLERA 45X6.5XMA NS SPNE (Screw) IMPLANT
SCREW SOLERA 6.5X50 (Screw) IMPLANT
SCREW SOLERA 7.5X50 (Screw) IMPLANT
SEALER BIPOLAR AQUA 6.0 (INSTRUMENTS) IMPLANT
SPIKE FLUID TRANSFER (MISCELLANEOUS) ×1 IMPLANT
SPONGE T-LAP 4X18 ~~LOC~~+RFID (SPONGE) IMPLANT
STAPLER VISISTAT (STAPLE) ×1 IMPLANT
SUT MNCRL AB 4-0 PS2 18 (SUTURE) ×1 IMPLANT
SUT VIC AB 0 CT1 18XCR BRD8 (SUTURE) ×1 IMPLANT
SUT VIC AB 2-0 CP2 18 (SUTURE) ×1 IMPLANT
SYR 30ML LL (SYRINGE) ×1 IMPLANT
SYR 30ML SLIP (SYRINGE) ×2 IMPLANT
TOWEL GREEN STERILE (TOWEL DISPOSABLE) ×1 IMPLANT
TOWEL GREEN STERILE FF (TOWEL DISPOSABLE) ×1 IMPLANT
TRAY FOLEY MTR SLVR 16FR STAT (SET/KITS/TRAYS/PACK) ×1 IMPLANT
TUBE MAZOR SA REDUCTION (TUBING) ×1 IMPLANT
WATER STERILE IRR 1000ML POUR (IV SOLUTION) ×1 IMPLANT

## 2024-04-13 NOTE — Anesthesia Procedure Notes (Signed)
 Arterial Line Insertion Start/End5/01/2024 7:10 AM Performed by: CRNA  Patient location: Pre-op. Preanesthetic checklist: patient identified, IV checked, site marked, risks and benefits discussed, surgical consent, monitors and equipment checked, pre-op evaluation, timeout performed and anesthesia consent Lidocaine  1% used for infiltration Left, radial was placed Catheter size: 20 G Hand hygiene performed  and maximum sterile barriers used   Attempts: 1 Procedure performed using ultrasound guided technique. Ultrasound Notes:anatomy identified, needle tip was noted to be adjacent to the nerve/plexus identified and no ultrasound evidence of intravascular and/or intraneural injection Following insertion, dressing applied and Biopatch. Post procedure assessment: normal and unchanged  Patient tolerated the procedure well with no immediate complications. Additional procedure comments: Placed by Ole Berkeley with crna assistance .

## 2024-04-13 NOTE — Transfer of Care (Signed)
 Immediate Anesthesia Transfer of Care Note  Patient: Bradley Fisher  Procedure(s) Performed: POSTERIOR SPINAL INSTRUMENTATION, THORACIC TEN TO PELVIS APPLICATION OF ROBOTIC ASSISTANCE FOR SPINAL PROCEDURE APPLICATION OF CELL SAVER  Patient Location: PACU  Anesthesia Type:General  Level of Consciousness: awake and alert   Airway & Oxygen Therapy: Patient connected to face mask oxygen  Post-op Assessment: Report given to RN and Post -op Vital signs reviewed and stable  Post vital signs: Reviewed and stable  Last Vitals:  Vitals Value Taken Time  BP 113/91 04/13/24 1527  Temp    Pulse 120 04/13/24 1529  Resp 25 04/13/24 1529  SpO2 90 % 04/13/24 1529  Vitals shown include unfiled device data.  Last Pain:  Vitals:   04/13/24 0720  TempSrc:   PainSc: 10-Worst pain ever      Patients Stated Pain Goal: 1 (04/13/24 0720)  Complications: No notable events documented.

## 2024-04-13 NOTE — Op Note (Signed)
 PREOP DIAGNOSIS: lumbar radiculopathy, degenerative scoliosis    POSTOP DIAGNOSIS: lumbar radiculopathy, degenerative scoliosis    PROCEDURE: 1.  T10-11, T11-12, T12-L1, L1-L2, L2-3 L3, L3-L4, L4-L5, L5-S1, sacroiliac posterior/posterolateral arthrodesis 2. Segmental instrumentation with pedicle and S2AI screw and rod construct at T10-T11-T12-L1-L2-L3-L4-L5-S1-S2AI 3.  Robot assisted navigation for placement of pedicle screws with Mazor 4. Harvest of local autograft 5. Use of morselized allograft   SURGEON: Dr. Arvilla Birmingham, MD   ASSISTANT: Easter Golden, PA.  Please note, there were no qualified trainees available to assist with the procedure.  An assistant was required for aid in retraction of the neural elements.   ANESTHESIA: General Endotracheal   EBL: 300 ml   IMPLANTS:  T10, T11, T12: 5.5 x 45 mm screws L1, L2, L3, L4: 6.5 x 50 mm L5, S1: 7.5 x 50 mm S2AI: 8.5 x 90 mm Unid Rod x 2 Magnifuse  SPECIMENS: None   DRAINS: JP drain x 2   COMPLICATIONS: None   CONDITION: Stable to PACU   HISTORY: This is a 58 yo M with history of L2-5 MIS decompression with Dr. Ali Antonio in 2021. Over past several years has had increased lower back pain and bilateral leg radiculopathy.  Imaging showed worsening degenerative scoliosis, loss of lumbar lordosis and worsened foraminal stenosis from L1-S1.  He tried PT but had to stop since it was making his symptoms worsening. Pain is in lower back, worse with walking.   ESIs x 2 failed to improve his symptoms.  As he had lateral listhesis at T12-L1 and his curve apex included the thoracolumbar junction, as a surgical option, T10-pelvis fusion was offered.    He underwent stage 1 L4-5, L5-S1 ALIFs and L1-2, L3-4 DLIFs 2 days ago and tolerated the procedure well, with the plan for stage 2 posterir fixation T10-pelvis.   Risks, benefits, alternatives, and expected convalescence were discussed with the patient.  Risks discussed included but were not  limited to bleeding, pain, infection, vascular injury, damage to intraabdominal organs, pseudoarthrosis, hardware failure, adjacent segment disease, CSF leak, neurologic deficits, weakness, numbness, paralysis, coma, and death. After all questions were answered, informed consent was obtained.    PROCEDURE IN DETAIL: After informed consent was obtained and witnessed, the patient was brought to the operating room. After induction of general anesthesia, the patient was positioned on the operative table in the prone position on a Wilson frame in neutral position with all pressure points meticulously padded. The skin of the low back was then prepped and draped in the usual sterile fashion.  1% lidocaine  with epinephrine  was injected in the skin and a timeout was performed.  Preoperative antibiotics were administered.  Incision was made with a 10 blade from T10 to the sacrum interspace as confirmed on C-arm.  Subcutaneous tissue and the fascia was incised with monopolar electrocautery.  The paraspinous muscles were dissected off the lamina in subperiosteal fashion.  Additional dissection continued out laterally, exposing the transverse processes of T10, T11, T12, L1, L2, L3, L4, L5, and the sacral ala bilaterally which were then decorticated.  Hypertrophied facets were partially removed with rongeur's with bone harvested for autograft.  Mazor robot was anchored to the spine via a L3 spinous process clamp.  Registration was performed using live fluoroscopy and a preoperative thin cut CT scan.  Screw trajectories were planned on the Mazor.  The robot arm was then moved to the appropriate position for each pedicle.  Cannula was placed and pedicle was drilled, then tapped, then appropriately  sized pedicle screws were placed with good purchase.  This was performed at T10, T11, T12, L1, L2, L3, L4, L5, and S1 bilaterally.  The robot arm was then moved to the sacrum and cannula was then placed between the S1 and S2 foramina  of the sacrum.  S2 AI screws were drilled, tapped, and screws were placed with excellent purchase.  AP and lateral x-rays were obtained and showed good positioning of the instrumentation.  Prefabricated rods were then fit into the screw heads, with in situ bending performed to conform to the tulip heads.  Screw caps were placed and then final tightened.  Final AP and lateral x-rays were performed and showed good restoration of sagittal balance and good reduction of his coronal deformity.  The wound was irrigated thoroughly.  Additional decortication was then performed of the remaining lamina as well as the sacrum.  meticulous hemostasis was obtained.  Magnifuse as well as autograft was placed in the lateral gutters between the transverse processes bilaterally.  The wound was sprinkled with vancomycin  powder.  2 10 flat JP drains were placed in the subfascial space and tunneled out the skin and secured with a stitch.  Exparel  mixed with Marcaine  was injected into the paraspinous muscles and subcutaneous tissues bilaterally.  The fascia was closed with 0 Vicryl stitches.  The dermal layer was closed with 2-0 Vicryl stitches in buried interrupted fashion.  The skin incisions were closed with 4-0 Monocryl subcuticular manner followed by Dermabond.  Sterile dressings were placed.  Patient was then flipped supine and extubated by the anesthesia service following commands and all 4 extremities.  All counts were correct at the end of surgery.  No complications were noted.

## 2024-04-13 NOTE — Anesthesia Procedure Notes (Signed)
 Procedure Name: Intubation Date/Time: 04/13/2024 8:15 AM  Performed by: Alvy Alsop J, CRNAPre-anesthesia Checklist: Patient identified, Emergency Drugs available, Suction available and Patient being monitored Patient Re-evaluated:Patient Re-evaluated prior to induction Oxygen Delivery Method: Circle System Utilized Preoxygenation: Pre-oxygenation with 100% oxygen Induction Type: IV induction Ventilation: Two handed mask ventilation required and Oral airway inserted - appropriate to patient size Laryngoscope Size: Mac and 3 Grade View: Grade I Tube type: Oral Tube size: 7.5 mm Number of attempts: 1 Airway Equipment and Method: Stylet and Oral airway Placement Confirmation: ETT inserted through vocal cords under direct vision, positive ETCO2 and breath sounds checked- equal and bilateral Secured at: 23 cm Tube secured with: Tape Dental Injury: Teeth and Oropharynx as per pre-operative assessment

## 2024-04-13 NOTE — Anesthesia Postprocedure Evaluation (Signed)
 Anesthesia Post Note  Patient: Derion J Bache  Procedure(s) Performed: POSTERIOR SPINAL INSTRUMENTATION, THORACIC TEN TO PELVIS APPLICATION OF ROBOTIC ASSISTANCE FOR SPINAL PROCEDURE APPLICATION OF CELL SAVER     Patient location during evaluation: PACU Anesthesia Type: General Level of consciousness: sedated, patient cooperative and oriented Pain management: pain level controlled Vital Signs Assessment: post-procedure vital signs reviewed and stable Respiratory status: spontaneous breathing, nonlabored ventilation, respiratory function stable and patient connected to nasal cannula oxygen Cardiovascular status: blood pressure returned to baseline and stable Postop Assessment: no apparent nausea or vomiting Anesthetic complications: no   No notable events documented.  Last Vitals:  Vitals:   04/13/24 1630 04/13/24 1700  BP: 96/79 122/75  Pulse: 99 96  Resp: 19 15  Temp: 36.7 C 36.6 C  SpO2: 94% 99%    Last Pain:  Vitals:   04/13/24 1703  TempSrc:   PainSc: 0-No pain                 Josselyn Harkins,E. Samaira Holzworth

## 2024-04-13 NOTE — Progress Notes (Signed)
 Subjective: Patient reports some right anterior thigh dysesthesias  Objective: Vital signs in last 24 hours: Temp:  [97.6 F (36.4 C)-98.4 F (36.9 C)] 98.2 F (36.8 C) (05/02 0403) Pulse Rate:  [69-97] 97 (05/02 0403) Resp:  [16-18] 18 (05/02 0403) BP: (101-151)/(51-91) 125/76 (05/02 0403) SpO2:  [94 %-100 %] 97 % (05/02 0403)  Intake/Output from previous day: 05/01 0701 - 05/02 0700 In: 1081.3 [P.O.:360; I.V.:721.3] Out: 1850 [Urine:1850] Intake/Output this shift: No intake/output data recorded.  5/5 strength in [place], except 4/5 R HF Dressings c/d  Lab Results: Recent Labs    04/11/24 0925 04/11/24 1101  HGB 10.9* 10.5*  HCT 32.0* 31.0*   BMET Recent Labs    04/11/24 0925 04/11/24 1101  NA 134* 135  K 4.3 4.6    Studies/Results: CT PELVIS WO CONTRAST Result Date: 04/12/2024 CLINICAL DATA:  Preoperative planning. Recent multilevel lumbar fusion. EXAM: CT PELVIS WITHOUT CONTRAST TECHNIQUE: Multidetector CT imaging of the pelvis was performed following the standard protocol without intravenous contrast. RADIATION DOSE REDUCTION: This exam was performed according to the departmental dose-optimization program which includes automated exposure control, adjustment of the mA and/or kV according to patient size and/or use of iterative reconstruction technique. COMPARISON:  Intraoperative radiographs 04/11/2024. CT lumbar spine 01/31/2024. FINDINGS: Thoracic and lumbar spine findings are dictated separately. Urinary Tract: The distal ureters appear normal in caliber. There is a small amount of air in the bladder lumen, likely iatrogenic. No bladder wall thickening. Bowel: No bowel wall thickening, distention or surrounding inflammation identified within the pelvis. Vascular/Lymphatic: No enlarged pelvic lymph nodes identified. Mild aortoiliac atherosclerosis. Reproductive: The prostate gland and seminal vesicles appear unremarkable. Other: As described on separate examination of the  thoracolumbar spine, there are postsurgical changes related to the recent thoracolumbar fusion. There is retroperitoneal and intraperitoneal air and fluid without focal hematoma or other organized collection. Gas extends into the right lateral abdominal wall. Musculoskeletal: Mild sacroiliac degenerative changes bilaterally. No evidence of pelvic fracture or diastasis of the sacroiliac joints or symphysis pubis. The hip joint spaces are preserved. There is a small amount of gas and fluid within the psoas muscles bilaterally attributed to the recent surgery. The pelvic musculature appears unremarkable. IMPRESSION: 1. Pelvic imaging for preoperative planning. 2. Postsurgical changes related to recent thoracolumbar fusion. 3. No unexpected or acute findings identified in the pelvis. 4. Thoracic and lumbar spine findings are dictated separately. Electronically Signed   By: Elmon Hagedorn M.D.   On: 04/12/2024 16:52   CT LUMBAR SPINE WO CONTRAST Result Date: 04/12/2024 CLINICAL DATA:  Preoperative planning. Patient underwent arthrodesis with anterior interbody technique at L1-2, L3-4, L4-5 and L5-S1 yesterday. EXAM: CT THORACIC AND LUMBAR SPINE WITHOUT CONTRAST TECHNIQUE: Multidetector CT imaging of the thoracic and lumbar spine was performed without contrast. Multiplanar CT image reconstructions were also generated. RADIATION DOSE REDUCTION: This exam was performed according to the departmental dose-optimization program which includes automated exposure control, adjustment of the mA and/or kV according to patient size and/or use of iterative reconstruction technique. COMPARISON:  CT thoracic and lumbar spine 01/31/2024. Intraoperative images [id number]. FINDINGS: CT THORACIC SPINE FINDINGS Alignment: The thoracic spine images extend inferiorly from T4. Full examination of the upper thoracic spine was specifically not requested. The alignment of the thoracic spine is normal aside from mild straightening. No focal  angulation or listhesis. Vertebrae: No evidence of acute fracture, traumatic subluxation or aggressive osseous lesion. Paraspinal and other soft tissues: The thoracic paraspinal soft tissues appear unremarkable. Mildly increased dependent atelectasis  in both lungs. No evidence of pneumothorax or significant pleural effusion. Disc levels: Again demonstrated is moderate to severe left foraminal narrowing at T10-11 secondary to asymmetric disc bulging, endplate osteophytes and facet hypertrophy. There is asymmetric disc bulging and facet hypertrophy on the left at T12-L1, with moderate to severe left foraminal narrowing. The thoracic spine findings appear unchanged from the previous CT. CT LUMBAR SPINE FINDINGS Segmentation: There are 5 lumbar type vertebral bodies. Alignment: Similar convex left scoliosis centered at L2-3. No focal angulation or significant listhesis. Vertebrae: Since the previous CT, the patient has undergone interval anterior interbody fusion at L4-5 and L5-S1 and right lateral interbody fusion at L1-2 and L3-4. The hardware appears well positioned and intact. There is no evidence of acute fracture. Stable interbody ankylosis asymmetric to the right at L2-3. No evidence of discitis or osteomyelitis. Paraspinal and other soft tissues: Postsurgical changes related to the recent multilevel fusion. There is retroperitoneal gas, asymmetric to the right. There is a small amount of pneumoperitoneum. In addition, there is low-density retroperitoneal fluid, asymmetric to the left. No organized fluid collection or hematoma identified. Disc levels: L1-2: Interval interbody fusion with improved patency of the spinal canal and right foramen. L2-3: Stable interbody ankylosis asymmetric to the right with endplate osteophytes and bilateral facet hypertrophy. Previous right-sided laminectomy. Unchanged chronic moderate right and mild left foraminal narrowing. L3-4: Interval interbody fusion with mildly improved  patency of the right foramen. Stable mild spinal stenosis. L4-5: Interval anterior interbody fusion with mildly improved patency of the spinal canal and neural foramina. L5-S1: Interval anterior interbody fusion with epidural air on the left extending into the left facet joint. No spinal stenosis. Improved left foraminal patency. IMPRESSION: 1. Interval anterior interbody fusion at L4-5 and L5-S1 and right lateral interbody fusion at L1-2 and L3-4 with improved patency of the spinal canal and neural foramina at these levels. 2. Postsurgical changes in the retroperitoneum and abdomen as described. No organized fluid collection or hematoma identified. 3. No evidence of acute fracture or traumatic subluxation of the thoracic or lumbar spine. 4. Unchanged spondylosis with moderate to severe left foraminal narrowing at T10-11 and T12-L1. Electronically Signed   By: Elmon Hagedorn M.D.   On: 04/12/2024 16:45   CT THORACIC SPINE WO CONTRAST Result Date: 04/12/2024 CLINICAL DATA:  Preoperative planning. Patient underwent arthrodesis with anterior interbody technique at L1-2, L3-4, L4-5 and L5-S1 yesterday. EXAM: CT THORACIC AND LUMBAR SPINE WITHOUT CONTRAST TECHNIQUE: Multidetector CT imaging of the thoracic and lumbar spine was performed without contrast. Multiplanar CT image reconstructions were also generated. RADIATION DOSE REDUCTION: This exam was performed according to the departmental dose-optimization program which includes automated exposure control, adjustment of the mA and/or kV according to patient size and/or use of iterative reconstruction technique. COMPARISON:  CT thoracic and lumbar spine 01/31/2024. Intraoperative images [id number]. FINDINGS: CT THORACIC SPINE FINDINGS Alignment: The thoracic spine images extend inferiorly from T4. Full examination of the upper thoracic spine was specifically not requested. The alignment of the thoracic spine is normal aside from mild straightening. No focal angulation or  listhesis. Vertebrae: No evidence of acute fracture, traumatic subluxation or aggressive osseous lesion. Paraspinal and other soft tissues: The thoracic paraspinal soft tissues appear unremarkable. Mildly increased dependent atelectasis in both lungs. No evidence of pneumothorax or significant pleural effusion. Disc levels: Again demonstrated is moderate to severe left foraminal narrowing at T10-11 secondary to asymmetric disc bulging, endplate osteophytes and facet hypertrophy. There is asymmetric disc bulging and facet hypertrophy on  the left at T12-L1, with moderate to severe left foraminal narrowing. The thoracic spine findings appear unchanged from the previous CT. CT LUMBAR SPINE FINDINGS Segmentation: There are 5 lumbar type vertebral bodies. Alignment: Similar convex left scoliosis centered at L2-3. No focal angulation or significant listhesis. Vertebrae: Since the previous CT, the patient has undergone interval anterior interbody fusion at L4-5 and L5-S1 and right lateral interbody fusion at L1-2 and L3-4. The hardware appears well positioned and intact. There is no evidence of acute fracture. Stable interbody ankylosis asymmetric to the right at L2-3. No evidence of discitis or osteomyelitis. Paraspinal and other soft tissues: Postsurgical changes related to the recent multilevel fusion. There is retroperitoneal gas, asymmetric to the right. There is a small amount of pneumoperitoneum. In addition, there is low-density retroperitoneal fluid, asymmetric to the left. No organized fluid collection or hematoma identified. Disc levels: L1-2: Interval interbody fusion with improved patency of the spinal canal and right foramen. L2-3: Stable interbody ankylosis asymmetric to the right with endplate osteophytes and bilateral facet hypertrophy. Previous right-sided laminectomy. Unchanged chronic moderate right and mild left foraminal narrowing. L3-4: Interval interbody fusion with mildly improved patency of the  right foramen. Stable mild spinal stenosis. L4-5: Interval anterior interbody fusion with mildly improved patency of the spinal canal and neural foramina. L5-S1: Interval anterior interbody fusion with epidural air on the left extending into the left facet joint. No spinal stenosis. Improved left foraminal patency. IMPRESSION: 1. Interval anterior interbody fusion at L4-5 and L5-S1 and right lateral interbody fusion at L1-2 and L3-4 with improved patency of the spinal canal and neural foramina at these levels. 2. Postsurgical changes in the retroperitoneum and abdomen as described. No organized fluid collection or hematoma identified. 3. No evidence of acute fracture or traumatic subluxation of the thoracic or lumbar spine. 4. Unchanged spondylosis with moderate to severe left foraminal narrowing at T10-11 and T12-L1. Electronically Signed   By: Elmon Hagedorn M.D.   On: 04/12/2024 16:45   DG Lumbar Spine 2-3 Views Result Date: 04/11/2024 CLINICAL DATA:  Surgical posterior fusion extending from T10 to pelvis. EXAM: LUMBAR SPINE - 2-3 VIEW; DG C-ARM 1-60 MIN-NO REPORT Radiation exposure index: 150.1 mGy. COMPARISON:  CT scan of January 31, 2024. FINDINGS: Sixteen fluoroscopic images were obtained of the lumbar spine. These demonstrate interbody spacers at L4-5 and L5-S1. Also noted was lateral fusion of L1-2 and L3-4. IMPRESSION: Fluoroscopic guidance provided during surgical fusion at multiple levels of the lumbar spine. Electronically Signed   By: Rosalene Colon M.D.   On: 04/11/2024 16:03   DG C-Arm 1-60 Min-No Report Result Date: 04/11/2024 CLINICAL DATA:  Surgical posterior fusion extending from T10 to pelvis. EXAM: LUMBAR SPINE - 2-3 VIEW; DG C-ARM 1-60 MIN-NO REPORT Radiation exposure index: 150.1 mGy. COMPARISON:  CT scan of January 31, 2024. FINDINGS: Sixteen fluoroscopic images were obtained of the lumbar spine. These demonstrate interbody spacers at L4-5 and L5-S1. Also noted was lateral fusion  of L1-2 and L3-4. IMPRESSION: Fluoroscopic guidance provided during surgical fusion at multiple levels of the lumbar spine. Electronically Signed   By: Rosalene Colon M.D.   On: 04/11/2024 16:03   DG C-Arm 1-60 Min-No Report Result Date: 04/11/2024 CLINICAL DATA:  Surgical posterior fusion extending from T10 to pelvis. EXAM: LUMBAR SPINE - 2-3 VIEW; DG C-ARM 1-60 MIN-NO REPORT Radiation exposure index: 150.1 mGy. COMPARISON:  CT scan of January 31, 2024. FINDINGS: Sixteen fluoroscopic images were obtained of the lumbar spine. These demonstrate  interbody spacers at L4-5 and L5-S1. Also noted was lateral fusion of L1-2 and L3-4. IMPRESSION: Fluoroscopic guidance provided during surgical fusion at multiple levels of the lumbar spine. Electronically Signed   By: Rosalene Colon M.D.   On: 04/11/2024 16:03   DG C-Arm 1-60 Min-No Report Result Date: 04/11/2024 CLINICAL DATA:  Surgical posterior fusion extending from T10 to pelvis. EXAM: LUMBAR SPINE - 2-3 VIEW; DG C-ARM 1-60 MIN-NO REPORT Radiation exposure index: 150.1 mGy. COMPARISON:  CT scan of January 31, 2024. FINDINGS: Sixteen fluoroscopic images were obtained of the lumbar spine. These demonstrate interbody spacers at L4-5 and L5-S1. Also noted was lateral fusion of L1-2 and L3-4. IMPRESSION: Fluoroscopic guidance provided during surgical fusion at multiple levels of the lumbar spine. Electronically Signed   By: Rosalene Colon M.D.   On: 04/11/2024 16:03   DG C-Arm 1-60 Min-No Report Result Date: 04/11/2024 CLINICAL DATA:  Surgical posterior fusion extending from T10 to pelvis. EXAM: LUMBAR SPINE - 2-3 VIEW; DG C-ARM 1-60 MIN-NO REPORT Radiation exposure index: 150.1 mGy. COMPARISON:  CT scan of January 31, 2024. FINDINGS: Sixteen fluoroscopic images were obtained of the lumbar spine. These demonstrate interbody spacers at L4-5 and L5-S1. Also noted was lateral fusion of L1-2 and L3-4. IMPRESSION: Fluoroscopic guidance provided during surgical fusion  at multiple levels of the lumbar spine. Electronically Signed   By: Rosalene Colon M.D.   On: 04/11/2024 16:03   DG C-Arm 1-60 Min-No Report Result Date: 04/11/2024 CLINICAL DATA:  Surgical posterior fusion extending from T10 to pelvis. EXAM: LUMBAR SPINE - 2-3 VIEW; DG C-ARM 1-60 MIN-NO REPORT Radiation exposure index: 150.1 mGy. COMPARISON:  CT scan of January 31, 2024. FINDINGS: Sixteen fluoroscopic images were obtained of the lumbar spine. These demonstrate interbody spacers at L4-5 and L5-S1. Also noted was lateral fusion of L1-2 and L3-4. IMPRESSION: Fluoroscopic guidance provided during surgical fusion at multiple levels of the lumbar spine. Electronically Signed   By: Rosalene Colon M.D.   On: 04/11/2024 16:03   DG C-Arm 1-60 Min-No Report Result Date: 04/11/2024 CLINICAL DATA:  Surgical posterior fusion extending from T10 to pelvis. EXAM: LUMBAR SPINE - 2-3 VIEW; DG C-ARM 1-60 MIN-NO REPORT Radiation exposure index: 150.1 mGy. COMPARISON:  CT scan of January 31, 2024. FINDINGS: Sixteen fluoroscopic images were obtained of the lumbar spine. These demonstrate interbody spacers at L4-5 and L5-S1. Also noted was lateral fusion of L1-2 and L3-4. IMPRESSION: Fluoroscopic guidance provided during surgical fusion at multiple levels of the lumbar spine. Electronically Signed   By: Rosalene Colon M.D.   On: 04/11/2024 16:03   DG C-Arm 1-60 Min-No Report Result Date: 04/11/2024 CLINICAL DATA:  Surgical posterior fusion extending from T10 to pelvis. EXAM: LUMBAR SPINE - 2-3 VIEW; DG C-ARM 1-60 MIN-NO REPORT Radiation exposure index: 150.1 mGy. COMPARISON:  CT scan of January 31, 2024. FINDINGS: Sixteen fluoroscopic images were obtained of the lumbar spine. These demonstrate interbody spacers at L4-5 and L5-S1. Also noted was lateral fusion of L1-2 and L3-4. IMPRESSION: Fluoroscopic guidance provided during surgical fusion at multiple levels of the lumbar spine. Electronically Signed   By: Rosalene Colon  M.D.   On: 04/11/2024 16:03   DG C-Arm 1-60 Min-No Report Result Date: 04/11/2024 CLINICAL DATA:  Surgical posterior fusion extending from T10 to pelvis. EXAM: LUMBAR SPINE - 2-3 VIEW; DG C-ARM 1-60 MIN-NO REPORT Radiation exposure index: 150.1 mGy. COMPARISON:  CT scan of January 31, 2024. FINDINGS: Sixteen fluoroscopic images  were obtained of the lumbar spine. These demonstrate interbody spacers at L4-5 and L5-S1. Also noted was lateral fusion of L1-2 and L3-4. IMPRESSION: Fluoroscopic guidance provided during surgical fusion at multiple levels of the lumbar spine. Electronically Signed   By: Rosalene Colon M.D.   On: 04/11/2024 16:03   DG OR LOCAL ABDOMEN Result Date: 04/11/2024 CLINICAL DATA:  Rule out unintentional retained radiopaque foreign object. EXAM: OR LOCAL ABDOMEN COMPARISON:  None Available. FINDINGS: Lower lumbar fusion hardware noted. No other retained radiopaque foreign object. There is no acute fracture or dislocation. The soft tissues are unremarkable. IMPRESSION: 1. No retained radiopaque foreign object. 2. Lower lumbar fusion hardware. Electronically Signed   By: Angus Bark M.D.   On: 04/11/2024 12:06    Assessment/Plan: Thoracolumbar stenosis/deformity - plan for T10-pelvis fusion today - Risks, benefits, alternatives, and expected convalescence were discussed with her.  Risks discussed included, but were not limited to bleeding, pain, infection, scar, spinal fluid leak, neurologic deficit, instability, pseudoarthrosis, damage to nearby organs, and death.  Informed consent was obtained.    Van Gelinas 04/13/2024, 7:48 AM

## 2024-04-13 NOTE — Plan of Care (Signed)
  Problem: Education: Goal: Knowledge of General Education information will improve Description: Including pain rating scale, medication(s)/side effects and non-pharmacologic comfort measures Outcome: Progressing   Problem: Coping: Goal: Level of anxiety will decrease Outcome: Progressing   Problem: Elimination: Goal: Will not experience complications related to bowel motility Outcome: Progressing   

## 2024-04-14 LAB — CBC
HCT: 26.3 % — ABNORMAL LOW (ref 39.0–52.0)
Hemoglobin: 9.1 g/dL — ABNORMAL LOW (ref 13.0–17.0)
MCH: 30.4 pg (ref 26.0–34.0)
MCHC: 34.6 g/dL (ref 30.0–36.0)
MCV: 88 fL (ref 80.0–100.0)
Platelets: 131 10*3/uL — ABNORMAL LOW (ref 150–400)
RBC: 2.99 MIL/uL — ABNORMAL LOW (ref 4.22–5.81)
RDW: 13.5 % (ref 11.5–15.5)
WBC: 15 10*3/uL — ABNORMAL HIGH (ref 4.0–10.5)
nRBC: 0 % (ref 0.0–0.2)

## 2024-04-14 LAB — BASIC METABOLIC PANEL WITH GFR
Anion gap: 7 (ref 5–15)
BUN: 13 mg/dL (ref 6–20)
CO2: 23 mmol/L (ref 22–32)
Calcium: 8.6 mg/dL — ABNORMAL LOW (ref 8.9–10.3)
Chloride: 104 mmol/L (ref 98–111)
Creatinine, Ser: 0.72 mg/dL (ref 0.61–1.24)
GFR, Estimated: >60 mL/min (ref >60)
Glucose, Bld: 122 mg/dL — ABNORMAL HIGH (ref 70–99)
Potassium: 4.5 mmol/L (ref 3.5–5.1)
Sodium: 134 mmol/L — ABNORMAL LOW (ref 135–145)

## 2024-04-14 NOTE — TOC Initial Note (Signed)
 Transition of Care [facility]) - Initial/Assessment Note    Patient Details  Name: Bradley Fisher MRN: [id number] Date of Birth: 1966-06-25  [facility] CM/SW Contact:    Jannice Mends, LCSW Phone Number: 04/14/2024, 12:24 PM  Clinical Narrative:                 CSW received consult for possible SNF at time of discharge. CSW spoke with patient. Patient reported that he would like home health services and does not want to go to SNF. He stated his wife will assist him. CSW discussed equipment needs and patient requested a rolling walker and will talk to his wife about other needs like a BSC CSW confirmed PCP and address. TOC to follow up.   Expected Discharge Plan: Home w Home Health Services Barriers to Discharge: Continued Medical Work up   Patient Goals and CMS Choice Patient states their goals for this hospitalization and ongoing recovery are:: Return home CMS Medicare.gov Compare Post Acute Care list provided to:: Patient Choice offered to / list presented to : Patient [place] ownership interest in [facility].provided to:: Patient    Expected Discharge Plan and Services In-house Referral: Clinical Social Work Discharge Planning Services: CM Consult Post Acute Care Choice: Home Health, Durable Medical Equipment Living arrangements for the past 2 months: Single Family Home                                      Prior Living Arrangements/Services Living arrangements for the past 2 months: Single Family Home Lives with:: Spouse Patient language and need for interpreter reviewed:: Yes Do you feel safe going back to the place where you live?: Yes      Need for Family Participation in Patient Care: Yes (Comment) Care giver support system in place?: Yes (comment) Current home services: DME (bed) Criminal Activity/Legal Involvement Pertinent to Current Situation/Hospitalization: No - Comment as needed  Activities of Daily Living   ADL Screening  (condition at time of admission) Independently performs ADLs?: Yes (appropriate for developmental age) Is the patient deaf or have difficulty hearing?: No Does the patient have difficulty seeing, even when wearing glasses/contacts?: No Does the patient have difficulty concentrating, remembering, or making decisions?: No  Permission Sought/Granted         Permission granted to share info w AGENCY: HH        Emotional Assessment Appearance:: Appears stated age Attitude/Demeanor/Rapport: Engaged Affect (typically observed): Accepting Orientation: : Oriented to Self, Oriented to Place, Oriented to  Time, Oriented to Situation Alcohol / Substance Use: Not Applicable Psych Involvement: No (comment)  Admission diagnosis:  Other form of scoliosis of thoracolumbar spine [M41.85] Lumbar radiculopathy [M54.16] Patient Active Problem List   Diagnosis Date Noted   Lumbar radiculopathy 04/11/2024   Scoliosis deformity of spine 03/02/2024   Abscess 05/20/2021   Tobacco use 01/22/2021   Gastroesophageal reflux disease without esophagitis 08/17/2017   Chronic seasonal allergic rhinitis due to pollen 03/09/2017   Depression, recurrent (HCC) 02/11/2017   Generalized OA 10/27/2016   Chronic midline low back pain without sciatica 10/27/2016   Hypertension, essential 10/27/2016   History of seizures 10/27/2016   PCP:  Chares Commons, PA-C Pharmacy:   [facility] [place], [place] - [address] [place] [address] Phone: [phone] Fax: [phone]     Social Drivers  of Health (SDOH) Social History: SDOH Screenings   Food Insecurity: No Food Insecurity (04/12/2024)  Housing: Low Risk  (04/12/2024)  Transportation Needs: No Transportation Needs (04/12/2024)  Utilities: Not At Risk (04/12/2024)  Depression (PHQ2-9): Low Risk  (05/20/2021)  Financial Resource Strain: Low Risk  (02/12/2019)  Physical Activity: Inactive (02/12/2019)  Social  Connections: Moderately Integrated (02/12/2019)  Stress: No Stress Concern Present (02/12/2019)  Tobacco Use: High Risk (04/13/2024)   SDOH Interventions:     Readmission Risk Interventions     No data to display

## 2024-04-14 NOTE — Progress Notes (Signed)
 Foley removed per MD order, no bleeding or pain noted, urinal at bedside, call bell in reach

## 2024-04-14 NOTE — Progress Notes (Signed)
 Pt spontaneously voided x3 with volumes 50ml, , and , pt bladder scan this am for . Pt denies loss of perineal sensation, able to move all extremities, continues to endorse RLE tingling/decreased sensation, will continue to monitor, call bell in reach

## 2024-04-14 NOTE — Progress Notes (Signed)
 Pt c/o continued tingling sensation to RLE, pt able to stand at bedside only a this time, will continue to monitor

## 2024-04-14 NOTE — Evaluation (Signed)
 Occupational Therapy RE-Evaluation Patient Details Name: Bradley Fisher MRN: [id number] DOB: 11/27/1966 Today's Date: 04/14/2024   History of Present Illness   Pt is a 58 yo male presenting to [facility] on 04/11/24 with chronic low back pain, working dx of lumbar radiculopathy. He is s/p Anterior retroperitoneal spine exposure for L4-L5 and L5-S1 ALIF on 04/11/24. PMH of DDD, HTN, seizures, OA, COPD, GERD, HLD.     Clinical Impressions Pt with similar presentation compared to his IE on 04/12/24. Pt needing min A +2 for transfer attempts and exhibits Impaired strength and coordination of RLE. Re-forced back precautions with pt throughout session, was able to safely transfer to recliner but continues to need max A to setup A for ADLs. OT to continue to progress pt as able. Patient will benefit from continued inpatient follow up therapy, <3 hours/day but pt continues to decline it. Will put in rehab consult as pt is not safe to DC home without further post acute therapy.      If plan is discharge home, recommend the following:   A little help with bathing/dressing/bathroom;Assistance with cooking/housework;Assist for transportation;Help with stairs or ramp for entrance;A little help with walking and/or transfers     Functional Status Assessment   Patient has had a recent decline in their functional status and demonstrates the ability to make significant improvements in function in a reasonable and predictable amount of time.     Equipment Recommendations   Other (comment);Tub/shower seat;BSC/3in1 (RW)     Recommendations for Other Services   Rehab consult     Precautions/Restrictions   Precautions Precautions: Back Precaution Booklet Issued: No Recall of Precautions/Restrictions: Impaired Precaution/Restrictions Comments: reviewed post op back precautions throughout session with mobility Required Braces or Orthoses: Spinal Brace Spinal Brace: Lumbar corset;Applied in sitting  position Restrictions Weight Bearing Restrictions Per Provider Order: No     Mobility Bed Mobility Overal bed mobility: Needs Assistance Bed Mobility: Rolling, Sidelying to Sit Rolling: Min assist Sidelying to sit: Mod assist            Transfers Overall transfer level: Needs assistance Equipment used: Rolling walker (2 wheels) Transfers: Bed to chair/wheelchair/BSC Sit to Stand: Min assist, +2 physical assistance     Step pivot transfers: +2 physical assistance, Min assist     General transfer comment: +2 Min A fro step pivot to chair cues for bending at hips to maintain back precautions despite lumbar corset      Balance Overall balance assessment: Needs assistance Sitting-balance support: No upper extremity supported, Feet supported Sitting balance-Leahy Scale: Fair     Standing balance support: Bilateral upper extremity supported, During functional activity, Reliant on assistive device for balance Standing balance-Leahy Scale: Poor Standing balance comment: Reliant on RW                           ADL either performed or assessed with clinical judgement   ADL   Eating/Feeding: Independent;Sitting   Grooming: Sitting;Set up   Upper Body Bathing: Sitting;Set up   Lower Body Bathing: Moderate assistance;Sitting/lateral leans   Upper Body Dressing : Sitting;Set up   Lower Body Dressing: Sitting/lateral leans;Maximal assistance   Toilet Transfer: Minimal assistance;+2 for physical assistance;Stand-pivot   Toileting- Clothing Manipulation and Hygiene: Maximal assistance;Sit to/from stand       Functional mobility during ADLs: Minimal assistance;Rolling walker (2 wheels);+2 for physical assistance       Vision         Perception  Praxis         Pertinent Vitals/Pain Pain Assessment Pain Assessment: Faces Faces Pain Scale: Hurts even more Pain Location: incisions and reporting discomfort at posterior/medial proximal  thigh Pain Descriptors / Indicators: Sore, Aching Pain Intervention(s): Monitored during session, Limited activity within patient's tolerance     Extremity/Trunk Assessment Upper Extremity Assessment Upper Extremity Assessment: Overall WFL for tasks assessed   Lower Extremity Assessment Lower Extremity Assessment: RLE deficits/detail RLE Sensation: decreased light touch RLE Coordination: decreased gross motor   Cervical / Trunk Assessment Cervical / Trunk Assessment: Back Surgery   Communication Communication Communication: No apparent difficulties   Cognition Arousal: Alert Behavior During Therapy: WFL for tasks assessed/performed Cognition: No family/caregiver present to determine baseline             OT - Cognition Comments: lack of awareness of his need for additional rehab.                 Following commands: Impaired Following commands impaired: Follows one step commands with increased time     Cueing  General Comments   Cueing Techniques: Verbal cues  no noted cardiac/respiratory distress during session   Exercises     Shoulder Instructions      Home Living                                          Prior Functioning/Environment                      OT Problem List: Decreased knowledge of precautions;Decreased knowledge of use of DME or AE;Impaired balance (sitting and/or standing);Pain   OT Treatment/Interventions: Self-care/ADL training;Patient/family education;Therapeutic exercise;Balance training;Therapeutic activities;DME and/or AE instruction      OT Goals(Current goals can be found in the care plan section)   Acute Rehab OT Goals Patient Stated Goal: To go home OT Goal Formulation: With patient Time For Goal Achievement: 04/28/24 Potential to Achieve Goals: Good   OT Frequency:  Min 2X/week    Co-evaluation              AM-PAC OT "6 Clicks" Daily Activity     Outcome Measure Help from another  person eating meals?: None Help from another person taking care of personal grooming?: A Little Help from another person toileting, which includes using toliet, bedpan, or urinal?: A Lot Help from another person bathing (including washing, rinsing, drying)?: A Lot Help from another person to put on and taking off regular upper body clothing?: A Little Help from another person to put on and taking off regular lower body clothing?: A Lot 6 Click Score: 16   End of Session Equipment Utilized During Treatment: Gait belt;Rolling walker (2 wheels) Nurse Communication: Mobility status  Activity Tolerance: Patient tolerated treatment well Patient left: in chair;with call bell/phone within reach;with chair alarm set  OT Visit Diagnosis: Pain;Unsteadiness on feet (R26.81);Other abnormalities of gait and mobility (R26.89) Pain - Right/Left:  (back)                Time: 9604-5409 OT Time Calculation (min): 28 min Charges:  OT General Charges $OT Visit: 1 Visit OT Evaluation $OT Eval Moderate Complexity: 1 Mod  04/14/2024  AB, OTR/L  Acute Rehabilitation Services  Office: [phone]   Jorene New 04/14/2024, 11:52 AM

## 2024-04-14 NOTE — Progress Notes (Signed)
 Postop day 1 from posterior segmental fixation from T10 to pelvis.  Overall doing well.  Pain well-managed.  Still with some right lower extremity numbness but minimal pain.  No abdominal pain.  Afebrile.  Vital signs are stable.  Follow-up hematocrit 26.7.  Drain output moderately high.  Wound clean dry and intact.  Abdomen soft.  Motor examination 5/5 bilaterally.  Overall progressing well.  Continue efforts at mobilization with therapy and efforts at pain control.  Continue drain.

## 2024-04-14 NOTE — Plan of Care (Signed)
   Problem: Education: Goal: Knowledge of General Education information will improve Description Including pain rating scale, medication(s)/side effects and non-pharmacologic comfort measures Outcome: Progressing   Problem: Activity: Goal: Risk for activity intolerance will decrease Outcome: Progressing   Problem: Safety: Goal: Ability to remain free from injury will improve Outcome: Progressing

## 2024-04-14 NOTE — Progress Notes (Signed)
 Physical Therapy Re-Evaluation Patient Details Name: Bradley Fisher MRN: [id number] DOB: 27-Jan-1966 Today's Date: 04/14/2024   History of Present Illness Pt is a 58 yo male presenting to [facility] on 04/11/24 with chronic low back pain, working dx of lumbar radiculopathy. He is s/p Anterior retroperitoneal spine exposure for L4-L5 and L5-S1 ALIF on 04/11/24. PMH of DDD, HTN, seizures, OA, COPD, GERD, HLD.    PT Comments  Pt is presenting similar to previous surgery after second surgery. Goals remain the same. Pt is progressing towards goals. Continues at Min A for rolling, Mod A for side lyling to sitting, Min a for sit to stand and step pivot transfers with RW. Pt has poor safety awareness and is at a high risk for falls. Due to pt current functional status, home set up and available assistance at home recommending skilled physical therapy services < 3 hours/day in order to address strength, balance and functional mobility to decrease risk for falls, injury, immobility, skin break down and re-hospitalization.      If plan is discharge home, recommend the following: A little help with walking and/or transfers;Assistance with cooking/housework;Assist for transportation;Help with stairs or ramp for entrance   Can travel by private vehicle     No  Equipment Recommendations  Rolling walker (2 wheels);BSC/3in1       Precautions / Restrictions Precautions Precautions: Back Precaution Booklet Issued: No Recall of Precautions/Restrictions: Impaired Precaution/Restrictions Comments: reviewed post op back precautions throughout session with mobility Required Braces or Orthoses: Spinal Brace Spinal Brace: Lumbar corset;Applied in sitting position Restrictions Weight Bearing Restrictions Per Provider Order: No     Mobility  Bed Mobility Overal bed mobility: Needs Assistance Bed Mobility: Rolling, Sidelying to Sit Rolling: Min assist Sidelying to sit: Mod assist            Transfers Overall  transfer level: Needs assistance Equipment used: Rolling walker (2 wheels) Transfers: Bed to chair/wheelchair/BSC Sit to Stand: Min assist, +2 physical assistance   Step pivot transfers: +2 physical assistance, Min assist       General transfer comment: +2 Min A fro step pivot to chair cues for bending at hips to maintain back precautions despite lumbar corset    Ambulation/Gait       General Gait Details: Deferred due to orders from surgeon for transfers to chair only.       Balance Overall balance assessment: Needs assistance Sitting-balance support: No upper extremity supported, Feet supported Sitting balance-Leahy Scale: Fair     Standing balance support: Bilateral upper extremity supported, During functional activity, Reliant on assistive device for balance Standing balance-Leahy Scale: Poor      Communication Communication Communication: No apparent difficulties  Cognition Arousal: Alert Behavior During Therapy: WFL for tasks assessed/performed   PT - Cognitive impairments: No family/caregiver present to determine baseline, Awareness, Safety/Judgement, Attention, Problem solving     Following commands: Impaired Following commands impaired: Follows one step commands with increased time    Cueing Cueing Techniques: Verbal cues     General Comments General comments (skin integrity, edema, etc.): no noted cardiac/respiratory distress during session      Pertinent Vitals/Pain Pain Assessment Pain Assessment: Faces Faces Pain Scale: Hurts even more Pain Location: incisions and reporting discomfort at posterior/medial proximal thigh Pain Descriptors / Indicators: Sore, Aching Pain Intervention(s): Monitored during session, Limited activity within patient's tolerance     PT Goals (current goals can now be found in the care plan section) Acute Rehab PT Goals Patient Stated Goal: to go  home PT Goal Formulation: With patient Time For Goal Achievement:  04/26/24 Potential to Achieve Goals: Good Progress towards PT goals: Progressing toward goals    Frequency    Min 5X/week      PT Plan  Continue with current POC        AM-PAC PT "6 Clicks" Mobility   Outcome Measure  Help needed turning from your back to your side while in a flat bed without using bedrails?: A Little Help needed moving from lying on your back to sitting on the side of a flat bed without using bedrails?: A Lot Help needed moving to and from a bed to a chair (including a wheelchair)?: A Little Help needed standing up from a chair using your arms (e.g., wheelchair or bedside chair)?: A Little Help needed to walk in hospital room?: A Lot Help needed climbing 3-5 steps with a railing? : Total 6 Click Score: 14    End of Session Equipment Utilized During Treatment: Back brace Activity Tolerance: Patient tolerated treatment well Patient left: in chair;with call bell/phone within reach;with chair alarm set Nurse Communication: Mobility status PT Visit Diagnosis: Other abnormalities of gait and mobility (R26.89)     Time: 1610-9604 PT Time Calculation (min) (ACUTE ONLY): 23 min  Charges:      PT General Charges $$ ACUTE PT VISIT: 1 Visit                    Sloan Duncans, DPT, CLT  Acute Rehabilitation Services Office: [phone] (Secure chat preferred)    Jenice Mitts 04/14/2024, 11:15 AM

## 2024-04-14 NOTE — Plan of Care (Signed)
  Problem: Elimination: Goal: Will not experience complications related to urinary retention Outcome: Progressing   Problem: Pain Managment: Goal: General experience of comfort will improve and/or be controlled Outcome: Progressing   Problem: Safety: Goal: Ability to remain free from injury will improve Outcome: Progressing   Problem: Skin Integrity: Goal: Risk for impaired skin integrity will decrease Outcome: Progressing

## 2024-04-15 NOTE — Plan of Care (Signed)
  Problem: Activity: Goal: Risk for activity intolerance will decrease Outcome: Progressing   Problem: Pain Managment: Goal: General experience of comfort will improve and/or be controlled Outcome: Progressing   Problem: Safety: Goal: Ability to remain free from injury will improve Outcome: Progressing   Problem: Skin Integrity: Goal: Risk for impaired skin integrity will decrease Outcome: Progressing

## 2024-04-15 NOTE — Progress Notes (Signed)
 Physical Therapy Treatment Patient Details Name: Bradley Fisher MRN: [id number] DOB: [birth year] Today's Date: 04/15/2024   History of Present Illness Pt is a 58 yo male presenting to [facility] on 04/11/24 with chronic low back pain, working dx of lumbar radiculopathy. He is s/p Anterior retroperitoneal spine exposure for L4-L5 and L5-S1 ALIF on 04/11/24. PMH of DDD, HTN, seizures, OA, COPD, GERD, HLD.    PT Comments  Pt is slowly progressing towards goals. Pt would like to return home but currently requires a heavy assist for very short distance gait and is an extreme risk for falls. Pt currently Min A for bed mobility and sit to stand. Pt requires Max A for 10 ft of gait with RW. Pt would require a hospital bed, lift and w/c for home with family with 24/7 supervision and physical assistance. Due to pt current functional status, home set up and available assistance at home recommending skilled physical therapy services < 3 hours/day in order to address strength, balance and functional mobility to decrease risk for falls, injury, immobility, skin break down and re-hospitalization.      If plan is discharge home, recommend the following: Assistance with cooking/housework;Assist for transportation;Help with stairs or ramp for entrance;A lot of help with walking and/or transfers   Can travel by private vehicle     No  Equipment Recommendations  Wheelchair cushion (measurements PT);Wheelchair (measurements PT);Hoyer lift;Hospital bed       Precautions / Restrictions Precautions Precautions: Back Precaution Booklet Issued: No Recall of Precautions/Restrictions: Impaired Precaution/Restrictions Comments: reviewed post op back precautions throughout session with mobility Required Braces or Orthoses: Spinal Brace Spinal Brace: Lumbar corset;Applied in sitting position Restrictions Weight Bearing Restrictions Per Provider Order: No     Mobility  Bed Mobility Overal bed mobility: Needs  Assistance Bed Mobility: Rolling, Sidelying to Sit Rolling: Min assist Sidelying to sit: Min assist       General bed mobility comments: Min A for bed mobility with cues for sequencing to get trunk to mid line.    Transfers Overall transfer level: Needs assistance Equipment used: Rolling walker (2 wheels) Transfers: Bed to chair/wheelchair/BSC, Sit to/from Stand Sit to Stand: Min assist   Step pivot transfers: Min assist       General transfer comment: Min A fro step pivot to chair cues for bending at hips to maintain back precautions despite lumbar corset    Ambulation/Gait Ambulation/Gait assistance: Max assist, Mod assist Gait Distance (Feet): 10 Feet Assistive device: Rolling walker (2 wheels) Gait Pattern/deviations: Step-to pattern, Decreased stance time - right, Decreased step length - right, Narrow base of support, Wide base of support, Trunk flexed Gait velocity: decreased Gait velocity interpretation: <1.31 ft/sec, indicative of household ambulator   General Gait Details: pt attempted short distance gait with poor WB through RLE and poor progression of RLE with heavy assist. Max A to maintain upright posture intermittently throughout gait with a consistent Mod A and multi modal sequencing cues.     Balance Overall balance assessment: Needs assistance Sitting-balance support: No upper extremity supported, Feet supported Sitting balance-Leahy Scale: Fair     Standing balance support: Bilateral upper extremity supported, During functional activity, Reliant on assistive device for balance Standing balance-Leahy Scale: Zero Standing balance comment: Reliant on RW and external support        Communication Communication Communication: No apparent difficulties  Cognition Arousal: Alert Behavior During Therapy: WFL for tasks assessed/performed   PT - Cognitive impairments: No family/caregiver present to determine baseline, Awareness, Safety/Judgement,  Attention,  Problem solving       Following commands: Impaired Following commands impaired: Follows one step commands with increased time    Cueing Cueing Techniques: Verbal cues     General Comments General comments (skin integrity, edema, etc.): no noted cardiac/respiratory distress during session. Pt wants to return home but would extremely unsafe      Pertinent Vitals/Pain Pain Assessment Pain Assessment: Faces Faces Pain Scale: Hurts little more Pain Location: incisions and reporting discomfort at posterior/medial proximal thigh Pain Descriptors / Indicators: Sore, Aching Pain Intervention(s): Monitored during session, Limited activity within patient's tolerance     PT Goals (current goals can now be found in the care plan section) Acute Rehab PT Goals Patient Stated Goal: to go home PT Goal Formulation: With patient Time For Goal Achievement: 04/26/24 Potential to Achieve Goals: Good Progress towards PT goals: Progressing toward goals    Frequency    Min 5X/week      PT Plan  Continue with current POC        AM-PAC PT "6 Clicks" Mobility   Outcome Measure  Help needed turning from your back to your side while in a flat bed without using bedrails?: A Little Help needed moving from lying on your back to sitting on the side of a flat bed without using bedrails?: A Little Help needed moving to and from a bed to a chair (including a wheelchair)?: A Little Help needed standing up from a chair using your arms (e.g., wheelchair or bedside chair)?: A Little Help needed to walk in hospital room?: A Lot Help needed climbing 3-5 steps with a railing? : Total 6 Click Score: 15    End of Session Equipment Utilized During Treatment: Back brace;Gait belt Activity Tolerance: Patient tolerated treatment well Patient left: in chair;with call bell/phone within reach;with chair alarm set Nurse Communication: Mobility status PT Visit Diagnosis: Other abnormalities of gait and mobility  (R26.89)     Time: 1914-7829 PT Time Calculation (min) (ACUTE ONLY): 18 min  Charges:    $Therapeutic Activity: 8-22 mins PT General Charges $$ ACUTE PT VISIT: 1 Visit                    Sloan Duncans, DPT, CLT  Acute Rehabilitation Services Office: [phone] (Secure chat preferred)    Jenice Mitts 04/15/2024, 4:23 PM

## 2024-04-15 NOTE — Progress Notes (Signed)
 Postop day 2.  Back pain reasonably well-controlled.  No new radicular symptoms.  A little bit more sore than yesterday.  No abdominal pain.  Patient reports flatus but no bowel movement.   Afebrile.  Vital signs are stable.  Drain output still significant.  Awake and alert.  Oriented and appropriate.  Motor and sensory function stable.  Abdomen soft.  Wound dressing clean and dry.  Progressing reasonably well.  Continue drain for at least another day.  Continue efforts at mobilization.

## 2024-04-15 NOTE — Plan of Care (Signed)

## 2024-04-16 ENCOUNTER — Encounter (HOSPITAL_COMMUNITY): Payer: Self-pay | Admitting: Neurosurgery

## 2024-04-16 LAB — BPAM RBC
Blood Product Expiration Date: 202505302359
Blood Product Expiration Date: 202505272359
Blood Product Expiration Date: 202505272359
ISSUE DATE / TIME: 202505012325
ISSUE DATE / TIME: 202505020042
ISSUE DATE / TIME: 202505021325
ISSUE DATE / TIME: 202505041509
ISSUE DATE / TIME: 202505302359
Unit Type and Rh: 202505302359
Unit Type and Rh: 202505302359
Unit Type and Rh: 5100
Unit Type and Rh: 5100
Unit Type and Rh: 5100
Unit Type and Rh: 5100

## 2024-04-16 LAB — TYPE AND SCREEN
ABO/RH(D): O POS
Antibody Screen: NEGATIVE
Unit division: 0
Unit division: 0
Unit division: 0
Unit division: 0

## 2024-04-16 NOTE — Progress Notes (Signed)
 Order for JP drain removal ordered, JP drain removed, patient tolerated well. JP drain intact, incision site applied with gauze.

## 2024-04-16 NOTE — Progress Notes (Signed)
    Durable Medical Equipment  (From admission, onward)           Start     Ordered   04/16/24 1109  For home use only DME Bedside commode  Once       Question:  Patient needs a bedside commode to treat with the following condition  Answer:  Weakness   04/16/24 1108   04/16/24 1109  For home use only DME standard manual wheelchair with seat cushion  Once       Comments: Patient suffers from s/p back surgery which impairs their ability to perform daily activities like bathing, dressing, and grooming in the home.  A walker will not resolve issue with performing activities of daily living. A wheelchair will allow patient to safely perform daily activities. Patient can safely propel the wheelchair in the home or has a caregiver who can provide assistance. Length of need 6 months . Accessories: elevating leg rests (ELRs), wheel locks, extensions and anti-tippers.   04/16/24 1108   04/16/24 1108  For home use only DME Other see comment  Once       Comments: Hoyer lift  Question:  Length of Need  Answer:  6 Months   04/16/24 1108            The patient is confined to one level of the home environment and there is no toilet on the that level of the home.

## 2024-04-16 NOTE — Plan of Care (Signed)
   Problem: Nutrition: Goal: Adequate nutrition will be maintained Outcome: Progressing   Problem: Coping: Goal: Level of anxiety will decrease Outcome: Progressing   Problem: Elimination: Goal: Will not experience complications related to bowel motility Outcome: Progressing

## 2024-04-16 NOTE — Progress Notes (Signed)
    Providing Compassionate, Quality Care - Together   NEUROSURGERY PROGRESS NOTE     S: No issues overnight.    O: EXAM:  BP 136/70 (BP Location: Left Arm)   Pulse 78   Temp 98.3 F (36.8 C) (Oral)   Resp 18   Ht 6' (1.829 m)   Wt 93 kg   SpO2 97%   BMI 27.80 kg/m     Awake, alert, oriented  Speech fluent, appropriate  MAEs Drain in place x2 SILTx4 Dressing c/d/i   ASSESSMENT:  58 y.o. s/p staged T10-pelvis fusion    PLAN: -Continue supportive care -Therapies as tolerated, would consider CIR for dc planning.  -Dc one JP drain today.  -Call w/ questions/concerns.   Bradley Fisher, [facility]

## 2024-04-16 NOTE — Progress Notes (Signed)
 PT Cancellation Note  Patient Details Name: CHRISTIANJACOB MCCREDIE MRN: [id number] DOB: [birth year]   Cancelled Treatment:    Reason Eval/Treat Not Completed: Other (comment) (Attempted to see pt however RN was troubleshooting JP drain. Will follow up later if time allows.)   Tekeya Geffert 04/16/2024, 11:59 AM

## 2024-04-16 NOTE — Progress Notes (Signed)
 Physical Therapy Treatment Patient Details Name: Bradley Fisher MRN: [id number] DOB: 1966/11/06 Today's Date: 04/16/2024   History of Present Illness Pt is a 58 yo male presenting to [facility] on 04/11/24 with chronic low back pain, working dx of lumbar radiculopathy. He is s/p Anterior retroperitoneal spine exposure for L4-L5 and L5-S1 ALIF on 04/11/24. PMH of DDD, HTN, seizures, OA, COPD, GERD, HLD.    PT Comments  Pt with poor tolerance to treatment today. Pt with similar presentation to previous session still requiring Max A to ambulate short distance in room however once in bathroom became orthostatic and less responsive. BP: 83/55 seated on toilet, BP: 99/74 seated on EOB, BP: 105/70 supine. RN made aware. Pt still adamant about DC home however pt extensively educated that DC home in his current would be extremely unsafe however pt continues to refuse SNF. No change in DC/DME recs at this time. PT will continue to follow.      If plan is discharge home, recommend the following: Assistance with cooking/housework;Assist for transportation;Help with stairs or ramp for entrance;A lot of help with walking and/or transfers   Can travel by private vehicle     No  Equipment Recommendations  Wheelchair cushion (measurements PT);Wheelchair (measurements PT);Hoyer lift;Hospital bed    Recommendations for Other Services       Precautions / Restrictions Precautions Precautions: Back Precaution Booklet Issued: No Recall of Precautions/Restrictions: Impaired Precaution/Restrictions Comments: reviewed post op back precautions throughout session with mobility Required Braces or Orthoses: Spinal Brace Spinal Brace: Lumbar corset;Applied in sitting position Restrictions Weight Bearing Restrictions Per Provider Order: No     Mobility  Bed Mobility Overal bed mobility: Needs Assistance Bed Mobility: Rolling, Sidelying to Sit Rolling: Min assist Sidelying to sit: Min assist       General bed  mobility comments: Min A for bed mobility with cues for sequencing to get trunk to mid line.    Transfers Overall transfer level: Needs assistance Equipment used: Rolling walker (2 wheels) Transfers: Bed to chair/wheelchair/BSC, Sit to/from Stand Sit to Stand: Min assist   Step pivot transfers: Mod assist, Max assist, +2 safety/equipment       General transfer comment: Min A to stand however required +2 Mod/Max for step pivot to chair cues for bending at hips to maintain back precautions despite lumbar corset.    Ambulation/Gait Ambulation/Gait assistance: Max assist, Mod assist   Assistive device: Rolling walker (2 wheels) Gait Pattern/deviations: Step-to pattern, Decreased stance time - right, Decreased step length - right, Narrow base of support, Wide base of support, Trunk flexed Gait velocity: decreased     General Gait Details: pt attempted short distance gait with poor WB through RLE and poor progression of RLE with heavy assist. Max A to maintain upright posture intermittently throughout gait with a consistent Mod A and multi modal sequencing cues. (no change from previous session.)   Stairs             Wheelchair Mobility     Tilt Bed    Modified Rankin (Stroke Patients Only)       Balance Overall balance assessment: Needs assistance Sitting-balance support: No upper extremity supported, Feet supported Sitting balance-Leahy Scale: Fair     Standing balance support: Bilateral upper extremity supported, During functional activity, Reliant on assistive device for balance Standing balance-Leahy Scale: Zero Standing balance comment: Reliant on RW and external support  Communication Communication Communication: No apparent difficulties  Cognition Arousal: Alert Behavior During Therapy: WFL for tasks assessed/performed   PT - Cognitive impairments: No family/caregiver present to determine baseline, Awareness,  Safety/Judgement, Attention, Problem solving                         Following commands: Impaired Following commands impaired: Follows one step commands with increased time    Cueing Cueing Techniques: Verbal cues  Exercises      General Comments General comments (skin integrity, edema, etc.): BP: 83/55 seated on toilet, BP: 99/74 seated on EOB, BP: 105/70 supine. RN made aware.      Pertinent Vitals/Pain Pain Assessment Pain Assessment: Faces Faces Pain Scale: Hurts little more Pain Location: incisions and reporting discomfort at posterior/medial proximal thigh Pain Descriptors / Indicators: Sore, Aching Pain Intervention(s): Monitored during session, Limited activity within patient's tolerance    Home Living                          Prior Function            PT Goals (current goals can now be found in the care plan section) Progress towards PT goals: Progressing toward goals    Frequency    Min 5X/week      PT Plan      Co-evaluation              AM-PAC PT "6 Clicks" Mobility   Outcome Measure  Help needed turning from your back to your side while in a flat bed without using bedrails?: A Little Help needed moving from lying on your back to sitting on the side of a flat bed without using bedrails?: A Little Help needed moving to and from a bed to a chair (including a wheelchair)?: A Little Help needed standing up from a chair using your arms (e.g., wheelchair or bedside chair)?: A Little Help needed to walk in hospital room?: A Lot Help needed climbing 3-5 steps with a railing? : Total 6 Click Score: 15    End of Session Equipment Utilized During Treatment: Back brace;Gait belt Activity Tolerance: Patient tolerated treatment well Patient left: in chair;with call bell/phone within reach;with chair alarm set Nurse Communication: Mobility status PT Visit Diagnosis: Other abnormalities of gait and mobility (R26.89)     Time:  1610-9604 PT Time Calculation (min) (ACUTE ONLY): 38 min  Charges:    $Gait Training: 8-22 mins $Therapeutic Activity: 23-37 mins PT General Charges $$ ACUTE PT VISIT: 1 Visit                     Bradley Fisher, PT, DPT Acute Rehab Services [phone]    Bradley Fisher 04/16/2024, 4:22 PM

## 2024-04-16 NOTE — TOC Progression Note (Signed)
 Transition of Care [facility]) - Progression Note    Patient Details  Name: Bradley Fisher MRN: [id number] Date of Birth: 09/07/1966  Transition of Care [facility]) CM/SW Contact  Jonathan Neighbor, RN Phone Number: 04/16/2024, 11:33 AM  Clinical Narrative:     Pt wants to dc home when medically ready. CM reached out to his spouse and she is in agreement.  She states they have an adjustable bed at home and she wants to use that and not get a hospital bed at this time.  Lyft/ BSC/ wheelchair all ordered through [employer] and will be delivered to his home. Home health arranged with [facility]. [name] will contact him for the first home visit. Pt will need transport home. TOC following.  Expected Discharge Plan: Home w Home Health Services Barriers to Discharge: Continued Medical Work up  Expected Discharge Plan and Services In-house Referral: Clinical Social Work Discharge Planning Services: CM Consult Post Acute Care Choice: Home Health, Durable Medical Equipment Living arrangements for the past 2 months: Single Family Home                 DME Arranged: Bedside commode, Wheelchair manual, Other see comment (hoyer lift) DME Agency: [employer] Date DME Agency Contacted: 04/16/24   Representative spoke with at DME Agency: [facility] HH Arranged: PT, OT, Nurse's Aide HH Agency: [facility] Date [facility] Agency Contacted: 04/16/24   Representative spoke with at [facility] Agency: Randel Buss   Social Determinants of Health (SDOH) Interventions SDOH Screenings   Food Insecurity: No Food Insecurity (04/12/2024)  Housing: Low Risk  (04/12/2024)  Transportation Needs: No Transportation Needs (04/12/2024)  Utilities: Not At Risk (04/12/2024)  Depression (PHQ2-9): Low Risk  (05/20/2021)  Financial Resource Strain: Low Risk  (02/12/2019)  Physical Activity: Inactive (02/12/2019)  Social Connections: Moderately Integrated (02/12/2019)  Stress: No Stress Concern Present (02/12/2019)  Tobacco Use: High Risk (04/13/2024)     Readmission Risk Interventions     No data to display

## 2024-04-16 NOTE — Plan of Care (Signed)

## 2024-04-17 ENCOUNTER — Encounter (HOSPITAL_COMMUNITY): Payer: Self-pay | Admitting: Neurosurgery

## 2024-04-17 MED FILL — Heparin Sodium (Porcine) Inj 1000 Unit/ML: INTRAMUSCULAR | Qty: 30 | Status: AC

## 2024-04-17 MED FILL — Sodium Chloride IV Soln 0.9%: INTRAVENOUS | Qty: 1000 | Status: AC

## 2024-04-17 MED FILL — Sodium Chloride IV Soln 0.9%: INTRAVENOUS | Qty: 2000 | Status: AC

## 2024-04-17 NOTE — Progress Notes (Signed)

## 2024-04-17 NOTE — TOC Progression Note (Signed)
 Transition of Care [facility]) - Progression Note    Patient Details  Name: Bradley Fisher MRN: [id number] Date of Birth: Sep 27, 1966  Transition of Care [facility]) CM/SW Contact  Jonathan Neighbor, RN Phone Number: 04/17/2024, 3:35 PM  Clinical Narrative:     Wife is asking about CIR. CM has asked CIR to review. TOC following.  Expected Discharge Plan: Home w Home Health Services Barriers to Discharge: Continued Medical Work up  Expected Discharge Plan and Services In-house Referral: Clinical Social Work Discharge Planning Services: CM Consult Post Acute Care Choice: Home Health, Durable Medical Equipment Living arrangements for the past 2 months: Single Family Home                 DME Arranged: Bedside commode, Wheelchair manual, Other see comment (hoyer lift) DME Agency: [employer] Date DME Agency Contacted: 04/16/24   Representative spoke with at DME Agency: [facility] HH Arranged: PT, OT, Nurse's Aide HH Agency: [facility] Date [facility] Agency Contacted: 04/16/24   Representative spoke with at [facility] Agency: Randel Buss   Social Determinants of Health (SDOH) Interventions SDOH Screenings   Food Insecurity: No Food Insecurity (04/12/2024)  Housing: Low Risk  (04/12/2024)  Transportation Needs: No Transportation Needs (04/12/2024)  Utilities: Not At Risk (04/12/2024)  Depression (PHQ2-9): Low Risk  (05/20/2021)  Financial Resource Strain: Low Risk  (02/12/2019)  Physical Activity: Inactive (02/12/2019)  Social Connections: Moderately Integrated (02/12/2019)  Stress: No Stress Concern Present (02/12/2019)  Tobacco Use: High Risk (04/13/2024)    Readmission Risk Interventions     No data to display

## 2024-04-17 NOTE — Progress Notes (Signed)
 Subjective: Back pain well-controlled.  No significant radicular pain in the legs.  Objective: Vital signs in last 24 hours: Temp:  [98.5 F (36.9 C)-99.2 F (37.3 C)] 98.9 F (37.2 C) (05/06 1128) Pulse Rate:  [72-80] 72 (05/06 1128) Resp:  [18] 18 (05/06 1128) BP: (116-133)/(68-73) 118/68 (05/06 1128) SpO2:  [93 %-98 %] 97 % (05/06 1128)  Intake/Output from previous day: 05/05 0701 - 05/06 0700 In: 600 [P.O.:600] Out: 725 [Urine:650; Drains:75] Intake/Output this shift: Total I/O In: -  Out: 350 [Urine:350]  No acute distress Full strength in lower extremities Dressing in place.  JP drain removed at bedside.  Lab Results: No results for input(s): "WBC", "HGB", "HCT", "PLT" in the last 72 hours. BMET No results for input(s): "NA", "K", "CL", "CO2", "GLUCOSE", "BUN", "CREATININE", "CALCIUM " in the last 72 hours.  Studies/Results: No results found.  Assessment/Plan: This is a 58 year old man who is status post L4-5, L5-S1 ALIF, L1-2, L3-4 DLIF, T10-ilium posterior instrumented fusion who is doing well. - In talking with Monterious and his wife, they are not interested in SNF.  However, they would be interested in acute rehab here at [facility].  Hopefully he will make sufficient progress with PT that he would be a candidate for this.  Otherwise we will arrange for home physical therapy and home equipment.   Van Gelinas 04/17/2024, 1:22 PM

## 2024-04-17 NOTE — Progress Notes (Signed)
 Physical Therapy Treatment Patient Details Name: Bradley Fisher MRN: [id number] DOB: [birth year] Today's Date: 04/17/2024   History of Present Illness Pt is a 58 yo male presenting to [facility] on 04/11/24 with chronic low back pain, working dx of lumbar radiculopathy. He is s/p Anterior retroperitoneal spine exposure for L4-L5 and L5-S1 ALIF on 04/11/24. PMH of DDD, HTN, seizures, OA, COPD, GERD, HLD.    PT Comments  Pt with poor tolerance to treatment today. Pt today able to stand with +2 Min A however ambulation was deferred due to orthostatic hypotension. BP: 119/76 supine, BP: 117/72 seated, BP: 92/54 standing, BP: 131/67 return to supine. RN made aware. No change in DC/DME recs at this time. PT will continue to follow.     If plan is discharge home, recommend the following: Assistance with cooking/housework;Assist for transportation;Help with stairs or ramp for entrance;A lot of help with walking and/or transfers   Can travel by private vehicle     No  Equipment Recommendations  Wheelchair cushion (measurements PT);Wheelchair (measurements PT);Hoyer lift;Hospital bed    Recommendations for Other Services       Precautions / Restrictions Precautions Precautions: Back Precaution Booklet Issued: No Recall of Precautions/Restrictions: Impaired Precaution/Restrictions Comments: reviewed post op back precautions throughout session with mobility Required Braces or Orthoses: Spinal Brace Spinal Brace: Lumbar corset;Applied in sitting position Restrictions Weight Bearing Restrictions Per Provider Order: No     Mobility  Bed Mobility Overal bed mobility: Needs Assistance Bed Mobility: Rolling, Sidelying to Sit Rolling: Min assist Sidelying to sit: Mod assist     Sit to sidelying: Min assist General bed mobility comments: Min/Mod A for bed mobility with cues for sequencing to get trunk to mid line.    Transfers Overall transfer level: Needs assistance Equipment used: Rolling walker  (2 wheels) Transfers: Sit to/from Stand Sit to Stand: Min assist, +2 safety/equipment, From elevated surface           General transfer comment: Min A to stand from elevated bed. Ambulation deferred due to orthostatic.    Ambulation/Gait               General Gait Details: Ambulation deferred due to BP.   Stairs             Wheelchair Mobility     Tilt Bed    Modified Rankin (Stroke Patients Only)       Balance Overall balance assessment: Needs assistance Sitting-balance support: No upper extremity supported, Feet supported Sitting balance-Leahy Scale: Fair     Standing balance support: Bilateral upper extremity supported, During functional activity, Reliant on assistive device for balance Standing balance-Leahy Scale: Zero Standing balance comment: Reliant on RW and external support                            Communication Communication Communication: No apparent difficulties  Cognition Arousal: Alert Behavior During Therapy: WFL for tasks assessed/performed   PT - Cognitive impairments: No family/caregiver present to determine baseline, Awareness, Safety/Judgement, Attention, Problem solving                       PT - Cognition Comments: Pt with extremely poor safety awareness stating that he will crawl into his house if he has to when asked how he will get home. Pt also unable to recall spinal precautions once again. Following commands: Impaired Following commands impaired: Follows one step commands with increased time  Cueing Cueing Techniques: Verbal cues  Exercises      General Comments General comments (skin integrity, edema, etc.): BP: 119/76 supine, BP: 117/72 seated, BP: 92/54 standing, BP: 131/67 return to supine      Pertinent Vitals/Pain Pain Assessment Pain Assessment: Faces Faces Pain Scale: Hurts little more Pain Location: incisions and reporting discomfort at posterior/medial proximal thigh Pain  Descriptors / Indicators: Sore, Aching Pain Intervention(s): Monitored during session, Premedicated before session, Limited activity within patient's tolerance    Home Living                          Prior Function            PT Goals (current goals can now be found in the care plan section) Progress towards PT goals: Progressing toward goals    Frequency    Min 5X/week      PT Plan      Co-evaluation              AM-PAC PT "6 Clicks" Mobility   Outcome Measure  Help needed turning from your back to your side while in a flat bed without using bedrails?: A Little Help needed moving from lying on your back to sitting on the side of a flat bed without using bedrails?: A Little Help needed moving to and from a bed to a chair (including a wheelchair)?: A Little Help needed standing up from a chair using your arms (e.g., wheelchair or bedside chair)?: A Little Help needed to walk in hospital room?: A Lot Help needed climbing 3-5 steps with a railing? : Total 6 Click Score: 15    End of Session Equipment Utilized During Treatment: Back brace;Gait belt Activity Tolerance: Patient tolerated treatment well Patient left: in bed;with call bell/phone within reach;with bed alarm set Nurse Communication: Mobility status PT Visit Diagnosis: Other abnormalities of gait and mobility (R26.89)     Time: 4098-1191 PT Time Calculation (min) (ACUTE ONLY): 19 min  Charges:    $Therapeutic Activity: 8-22 mins PT General Charges $$ ACUTE PT VISIT: 1 Visit                     Branda Chaudhary B, PT, DPT Acute Rehab Services [phone]    [name] 04/17/2024, 4:31 PM

## 2024-04-18 DIAGNOSIS — I951 Orthostatic hypotension: Secondary | ICD-10-CM | POA: Diagnosis not present

## 2024-04-18 DIAGNOSIS — J449 Chronic obstructive pulmonary disease, unspecified: Secondary | ICD-10-CM | POA: Diagnosis not present

## 2024-04-18 DIAGNOSIS — I10 Essential (primary) hypertension: Secondary | ICD-10-CM

## 2024-04-18 DIAGNOSIS — R569 Unspecified convulsions: Secondary | ICD-10-CM

## 2024-04-18 DIAGNOSIS — M5416 Radiculopathy, lumbar region: Secondary | ICD-10-CM | POA: Diagnosis not present

## 2024-04-18 DIAGNOSIS — K5900 Constipation, unspecified: Secondary | ICD-10-CM | POA: Diagnosis not present

## 2024-04-18 NOTE — Consult Note (Signed)
 Physical Medicine and Rehabilitation Consult Reason for Consult:Rehab Referring Physician: Dr. Andy Bannister   HPI: Bradley Fisher is a 58 y.o. male COPD, depression, seizures, hypertension, hyperlipidemia and chronic back pain who was admitted for lumbar surgery due to thoracolumbar stenosis/deformity.  He had posterior segmental fixation from T10 to pelvis completed on 04/13/2024 for lumbar radiculopathy, degenerative scoliosis.  Had JP drains now removed .  Continues to have right lower extremity numbness.  Patient noted to have orthostatic hypotension, very poor safety awareness.  He continues to have back pain, controlled with medications overall. Patient seen by PT and OT and found to have functional deficits and felt to be a candidate for intensive rehab program. PT reports wife will assist when he gets home 24/7.    Home: Home Living Family/patient expects to be discharged to:: Private residence Living Arrangements: Spouse/significant other Available Help at Discharge: Family, Available 24 hours/day Type of Home: House Home Access: Stairs to enter [employer] of Steps: 6 front, 3 in the back Entrance Stairs-Rails: Can reach both, Left, Right Home Layout: One level Bathroom Shower/Tub: Engineer, manufacturing systems: Standard Home Equipment: Hand held shower head, Shower seat  Functional History: Prior Function Prior Level of Function : Independent/Modified Independent Mobility Comments: Ind no AD ADLs Comments: ind Functional Status:  Mobility: Bed Mobility Overal bed mobility: Needs Assistance Bed Mobility: Rolling, Sidelying to Sit Rolling: Min assist Sidelying to sit: Mod assist Sit to sidelying: Min assist General bed mobility comments: Min/Mod A for bed mobility with cues for sequencing to get trunk to mid line. Cues and reinforcement for log roll Transfers Overall transfer level: Needs assistance Equipment used: Rolling walker (2 wheels) Transfers:  Sit to/from Stand Sit to Stand: Min assist, +2 safety/equipment, From elevated surface Bed to/from chair/wheelchair/BSC transfer type:: Step pivot Step pivot transfers: Mod assist, Max assist, +2 safety/equipment General transfer comment: Min A to stand from elevated bed. Pt reported dizziness during orthostatic check and requested to sit down. Ambulation deferred due to orthostatic. Ambulation/Gait Ambulation/Gait assistance: Max assist, Mod assist Gait Distance (Feet): 10 Feet Assistive device: Rolling walker (2 wheels) Gait Pattern/deviations: Step-to pattern, Decreased stance time - right, Decreased step length - right, Narrow base of support, Wide base of support, Trunk flexed General Gait Details: Ambulation deferred due to BP. Gait velocity: decreased Gait velocity interpretation: <1.31 ft/sec, indicative of household ambulator    ADL: ADL Overall ADL's : Needs assistance/impaired Eating/Feeding: Independent, Sitting Grooming: Sitting, Set up Upper Body Bathing: Sitting, Set up Lower Body Bathing: Moderate assistance, Sitting/lateral leans Upper Body Dressing : Sitting, Set up Lower Body Dressing: Sitting/lateral leans, Moderate assistance, With adaptive equipment Lower Body Dressing Details (indicate cue type and reason): Educated pt on use of shoe horn and and sock aid for LBD. Mod cues and assist for new learning. Toilet Transfer: Minimal assistance, +2 for physical assistance, Stand-pivot Toilet Transfer Details (indicate cue type and reason): limited activity restrictions per MD orders. only transfer to recliner. Toileting- Clothing Manipulation and Hygiene: Maximal assistance, Sit to/from stand Functional mobility during ADLs: Minimal assistance, Rolling walker (2 wheels), +2 for physical assistance General ADL Comments: Educated pt on POB precautions.  Cognition: Cognition Orientation Level: Oriented to person, Oriented to place, Disoriented to situation, Oriented to  time Cognition Arousal: Alert Behavior During Therapy: [facility] for tasks assessed/performed   Review of Systems  Constitutional:  Negative for chills and fever.  HENT:  Negative for congestion.   Eyes:  Negative for blurred vision  and double vision.  Respiratory:  Negative for shortness of breath.   Cardiovascular:  Negative for chest pain.  Gastrointestinal:  Positive for constipation. Negative for abdominal pain, nausea and vomiting.  Genitourinary: Negative.   Musculoskeletal:  Positive for back pain.  Skin:  Negative for rash.  Neurological:  Positive for sensory change and weakness.   Past Medical History:  Diagnosis Date   Arthritis    Asthma    COPD (chronic obstructive pulmonary disease) (HCC)    Depression    GERD (gastroesophageal reflux disease)    Hyperlipidemia    Hypertension    Seizures (HCC)    unknow etiology; on meds for this; no seizures since on meds 10 years ago   Past Surgical History:  Procedure Laterality Date   ABDOMINAL EXPOSURE N/A 04/11/2024   Procedure: ABDOMINAL EXPOSURE;  Surgeon: Young Hensen, MD;  Location: [facility] OR;  Service: Vascular;  Laterality: N/A;   ANTERIOR LAT LUMBAR FUSION Right 04/11/2024   Procedure: LUMBAR ONE-TWO, LUMBAR THREE-FOUR ANTERIOR LATERAL LUMBAR FUSION;  Surgeon: Van Gelinas, MD;  Location: [facility] OR;  Service: Neurosurgery;  Laterality: Right;  L12, L34 DLIF RT SIDE LATERAL DECUB   ANTERIOR LUMBAR FUSION Right 04/11/2024   Procedure: LUMBAR FOUR-FIVE, LUMBAR FIVE-SACRAL ONE ANTERIOR LUMBAR FUSION;  Surgeon: Van Gelinas, MD;  Location: [facility] OR;  Service: Neurosurgery;  Laterality: Right;  ALIF L45, L5S1   APPLICATION OF CELL SAVER N/A 04/13/2024   Procedure: APPLICATION OF CELL SAVER;  Surgeon: Van Gelinas, MD;  Location: [facility] OR;  Service: Neurosurgery;  Laterality: N/A;   APPLICATION OF ROBOTIC ASSISTANCE FOR SPINAL PROCEDURE N/A 04/13/2024   Procedure: APPLICATION OF ROBOTIC ASSISTANCE FOR SPINAL PROCEDURE;   Surgeon: Van Gelinas, MD;  Location: [facility] OR;  Service: Neurosurgery;  Laterality: N/A;   COLONOSCOPY WITH PROPOFOL  N/A 05/29/2018   Procedure: COLONOSCOPY WITH PROPOFOL ;  Surgeon: Suzette Espy, MD;  Location: [facility];  Service: Endoscopy;  Laterality: N/A;  9:45am   KNEE ARTHROSCOPY WITH MEDIAL MENISECTOMY Left 11/11/2016   Procedure: LEFT KNEE ARTHROSCOPY WITH MEDIAL MENISECTOMY;  Surgeon: Darrin Emerald, MD;  Location: [facility] ORS;  Service: Orthopedics;  Laterality: Left;   Thumb surgery-left  02/25/2020   Family History  Problem Relation Age of Onset   Hypertension Mother    Diabetes Mother    Hypertension Father    Diabetes Father    Diabetes Brother    Colon cancer Neg Hx    Social History:  reports that he has been smoking cigars. He started smoking about 51 years ago. He has been exposed to tobacco smoke. His smokeless tobacco use includes snuff. He reports current alcohol use of about 12.0 standard drinks of alcohol per week. He reports that he does not use drugs. Allergies: No Known Allergies Medications Prior to Admission  Medication Sig Dispense Refill   amlodipine -olmesartan  (AZOR ) 10-20 MG tablet Take 1 tablet by mouth daily.     esomeprazole  (NEXIUM ) 40 MG capsule Take 40 mg by mouth daily.     gabapentin  (NEURONTIN ) 400 MG capsule Take 1 capsule (400 mg total) by mouth at bedtime. For PAIN (Patient taking differently: Take 400 mg by mouth 3 (three) times daily.) 90 capsule 1   HYDROcodone -acetaminophen  (NORCO) 10-325 MG tablet Take 1 tablet by mouth 2 (two) times daily as needed for severe pain. (Patient taking differently: Take 1 tablet by mouth every 4 (four) hours as needed for severe pain (pain score 7-10).) 30 tablet 0  PROAIR  HFA 108 (90 Base) MCG/ACT inhaler Inhale 2 puffs into the lungs every 6 (six) hours as needed for wheezing or shortness of breath. 18 g 2   rosuvastatin  (CRESTOR ) 5 MG tablet Take 5 mg by mouth daily.     atorvastatin  (LIPITOR) 10 MG  tablet TAKE 1 TABLET DAILY (Patient not taking: Reported on 03/27/2024) 90 tablet 3   cetirizine  (ZYRTEC ) 10 MG tablet Take 1 tablet (10 mg total) by mouth daily. (Patient not taking: Reported on 03/27/2024) 90 tablet 3   fluticasone  (FLONASE ) 50 MCG/ACT nasal spray Place 2 sprays into both nostrils daily. (Patient not taking: Reported on 03/27/2024) 16 g 6   meloxicam  (MOBIC ) 15 MG tablet Take 1 tablet (15 mg total) by mouth daily. (Patient not taking: Reported on 03/27/2024) 90 tablet 1   naloxone  (NARCAN ) nasal spray 4 mg/0.1 mL Place 1 spray into the nose.       Blood pressure 105/79, pulse 69, temperature 98.1 F (36.7 C), temperature source Oral, resp. rate 17, height 6' (1.829 m), weight 93 kg, SpO2 97%. Physical Exam  General: No apparent distress HEENT: Head is normocephalic, atraumatic, oral mucosa pink and moist, dentition decreased- says he has dentures, + glasses Neck: Supple without JVD or lymphadenopathy Heart: Reg rate and rhythm. No murmurs rubs or gallops Chest: CTA bilaterally without wheezes, rales, or rhonchi; no distress Abdomen: Soft, non-tender, non-distended, bowel sounds positive. Extremities: No clubbing, cyanosis, or edema. Pulses are 2+ Psych: Pt's affect is appropriate. Pt is cooperative Skin: Honeycomb dressings to spinal incisions CDI Neuro:    Mental Status: AAOx3, short-term memory deficits, decreased insight, poor safety awareness, distractable Speech/Languate: Naming and repetition intact, fluent-a little difficult to understand but this may be his baseline speech, follows simple commands CRANIAL NERVES: 2 through 12 grossly intact   MOTOR: RUE: 5/5 Deltoid, 5/5 Biceps, 5/5 Triceps,5/5 Grip LUE: 5/5 Deltoid, 5/5 Biceps, 5/5 Triceps, 5/5 Grip  RLE: HF 4-/5, KE 4-/5, ADF 4+/5, APF 4+/5 LLE: HF 4/5, KE 4/5, ADF 5+/5, APF 4+/5  REFLEXES: No ankle clonus  SENSORY: Sensation altered in right lower extremity to light touch  Coordination: Normal finger  to nose   R dorsal wrist IV in place   No results found for this or any previous visit (from the past 24 hours). No results found.  Assessment/Plan: Diagnosis: Lumbar spondylosis/stenosis/radiculopathy s/p L4-5, L5-S1 ALIF, L1-2, L3-4 DLIF, T10-ilium posterior instrumented fusion  Does the need for close, 24 hr/day medical supervision in concert with the patient's rehab needs make it unreasonable for this patient to be served in a less intensive setting? Yes Co-Morbidities requiring supervision/potential complications:  - Seizures, hypertension, GERD, depression, COPD, chronic back pain Due to bladder management, bowel management, safety, skin/wound care, disease management, medication administration, pain management, and patient education, does the patient require 24 hr/day rehab nursing? Yes Does the patient require coordinated care of a physician, rehab nurse, therapy disciplines of PT/OT to address physical and functional deficits in the context of the above medical diagnosis(es)? Yes Addressing deficits in the following areas: balance, endurance, locomotion, strength, transferring, bowel/bladder control, bathing, dressing, feeding, grooming, toileting, cognition, and psychosocial support Can the patient actively participate in an intensive therapy program of at least 3 hrs of therapy per day at least 5 days per week? Yes The potential for patient to make measurable gains while on inpatient rehab is excellent Anticipated functional outcomes upon discharge from inpatient rehab are modified independent and supervision  with PT, modified independent and supervision with  OT, modified independent and supervision with SLP. Estimated rehab length of stay to reach the above functional goals is: 7-10 Anticipated discharge destination: Home Overall Rehab/Functional Prognosis: excellent  POST ACUTE RECOMMENDATIONS: This patient's condition is appropriate for continued rehabilitative care in the  following setting: CIR Patient has agreed to participate in recommended program. Yes Note that insurance prior authorization may be required for reimbursement for recommended care.  Comment: I think patient would be a good candidate for CIR.  Rehab coordinator to follow-up.   MEDICAL RECOMMENDATIONS: Consider additional medication for constipation. Could try sorbitol.  2.   Consider lower extremity compression/ encouraging fluids for orthostatic hypotension. Could try salt tabs also.    I have personally performed a face to face diagnostic evaluation of this patient. Additionally, I have examined the patient's medical record including any pertinent labs and radiographic images.    Thanks,  Lylia Sand, MD 04/18/2024

## 2024-04-18 NOTE — Progress Notes (Signed)
 Inpatient Rehab Coordinator Note:  I met with patient at bedside and spoke to spouse over the phone to discuss CIR recommendations and goals/expectations of CIR stay.  We reviewed 3 hrs/day of therapy, physician follow up, and average length of stay 2 weeks (dependent upon progress) with goals of supervision.  Pt easily internally distracted, but agreeable to short term rehab.  Spouse familiar with program as a family member has been with us  before.  They are agreeable to pursue and I will start auth once I have updated OT note and PMR consult.    Loye Rumble, PT, DPT Admissions Coordinator [phone] 04/18/24  10:50 AM

## 2024-04-18 NOTE — PMR Pre-admission (Signed)
 PMR Admission Coordinator Pre-Admission Assessment  Patient: Bradley Fisher Pain is an 58 y.o., male MRN: [id number] DOB: 10/09/1966 Height: 6' (182.9 cm) Weight: 93 kg              Insurance Information HMO: yes    PPO:      PCP:      IPA:      80/20:      OTHER:  PRIMARY: U[employer] Medicare Dual Complete      Policy#: [id number]      Subscriber: pt CM Name: O[place]an [place]      Phone#: [phone]     Fax#: [phone] Pre-Cert#: [id number] auth for CIR following P2P with Dr. Lovorn with updates due to fax listed above on 5/16      Employer:  Benefits:  Phone #: [phone]     Name:  Eff. Date: 01/14/24     Deduct: $257 (met)      Out of Pocket Max: [phone] (met $813.30)      Life Max:   CIR: $1610/admit      SNF: 20 full days Outpatient: 80%     Co-Pay: 20% Home Health: 100%      Co-Pay:  DME: 80%     Co-Pay: 20% Providers:  SECONDARY: Medicaid inactive      Policy#:       Phone#:   Artist:       Phone#:   The Data processing manager" for patients in Inpatient Rehabilitation Facilities with attached "Privacy Act Statement-Health Care Records" was provided and verbally reviewed with: Patient and Family  Emergency Contact Information Contact Information     Name Relation Home Work Mobile   [place] Spouse [phone]  [phone]   Mabe,Cathy Sister [phone]  [phone]      Other Contacts   None on File    Current Medical History  Patient Admitting Diagnosis: functional deficits following anterior retroperitoneal spine exposure for L4-S1 ALIF  History of Present Illness:  Pt is a 58 y.o. male COPD, depression, seizures, hypertension, hyperlipidemia and chronic back pain who was admitted for lumbar surgery due to thoracolumbar stenosis/deformity.  He had posterior segmental fixation from T10 to pelvis completed on 04/13/2024 for lumbar radiculopathy, degenerative scoliosis.  Had JP drains now removed .  Continues to have right lower extremity numbness.   Patient noted to have orthostatic hypotension, very poor safety awareness.  He continues to have back pain, controlled with medications overall. Patient seen by PT and OT and found to have functional deficits and felt to be a candidate for intensive rehab program. PT reports wife will assist when he gets home 24/7.      Glasgow Coma Scale Score: 15  Patient's medical record from [facility] has been reviewed by the rehabilitation admission coordinator and physician.  Past Medical History  Past Medical History:  Diagnosis Date   Arthritis    Asthma    COPD (chronic obstructive pulmonary disease) (HCC)    Depression    GERD (gastroesophageal reflux disease)    Hyperlipidemia    Hypertension    Seizures (HCC)    unknow etiology; on meds for this; no seizures since on meds 10 years ago    Has the patient had major surgery during 100 days prior to admission? Yes  Family History  family history includes Diabetes in his brother, father, and mother; Hypertension in his father and mother.   Current Medications   Current Facility-Administered Medications:    acetaminophen  (TYLENOL ) tablet 650 mg, 650 mg,  Oral, Q4H PRN, 650 mg at 04/21/24 0854 **OR** acetaminophen  (TYLENOL ) suppository 650 mg, 650 mg, Rectal, Q4H PRN, Easter Golden Caylin, PA-C   albuterol  (PROVENTIL ) (2.5 MG/3ML) 0.083% nebulizer solution 2.5 mg, 2.5 mg, Nebulization, Q6H PRN, Easter Golden Caylin, PA-C   amLODipine  (NORVASC ) tablet 10 mg, 10 mg, Oral, Daily, Thomas, Jonathan G, MD, 10 mg at 04/23/24 0835   bacitracin ointment, , Topical, BID, Van Gelinas, MD, 31.5 Application at 04/23/24 0836   docusate sodium  (COLACE) capsule 100 mg, 100 mg, Oral, BID, Easter Golden Caylin, PA-C, 100 mg at 04/23/24 1610   enoxaparin  (LOVENOX ) injection 40 mg, 40 mg, Subcutaneous, Q24H, Easter Golden Caylin, PA-C, 40 mg at 04/23/24 9604   gabapentin  (NEURONTIN ) capsule 400 mg, 400 mg, Oral, TID, Easter Golden Caylin, PA-C,  400 mg at 04/23/24 5409   irbesartan  (AVAPRO ) tablet 150 mg, 150 mg, Oral, Daily, Van Gelinas, MD, 150 mg at 04/23/24 0835   magnesium  citrate solution 1 Bottle, 1 Bottle, Oral, Once, Van Gelinas, MD   menthol -cetylpyridinium (CEPACOL) lozenge 3 mg, 1 lozenge, Oral, PRN **OR** phenol (CHLORASEPTIC) mouth spray 1 spray, 1 spray, Mouth/Throat, PRN, Easter Golden Caylin, PA-C   methocarbamol  (ROBAXIN ) tablet 500 mg, 500 mg, Oral, Q6H PRN, 500 mg at 04/22/24 2037 **OR** methocarbamol  (ROBAXIN ) injection 500 mg, 500 mg, Intravenous, Q6H PRN, Easter Golden Caylin, PA-C, 500 mg at 04/13/24 1552   morphine  (PF) 2 MG/ML injection 2 mg, 2 mg, Intravenous, Q3H PRN, Tomlinson, Sara Caylin, PA-C, 2 mg at 04/13/24 8119   ondansetron  (ZOFRAN ) tablet 4 mg, 4 mg, Oral, Q6H PRN **OR** ondansetron  (ZOFRAN ) injection 4 mg, 4 mg, Intravenous, Q6H PRN, Tomlinson, Sara Caylin, PA-C   Oral care mouth rinse, 15 mL, Mouth Rinse, PRN, Van Gelinas, MD   oxyCODONE  (Oxy IR/ROXICODONE ) immediate release tablet 10 mg, 10 mg, Oral, Q3H PRN, Easter Golden Caylin, PA-C, 10 mg at 04/23/24 0413   oxyCODONE  (Oxy IR/ROXICODONE ) immediate release tablet 5 mg, 5 mg, Oral, Q3H PRN, Tomlinson, Sara Caylin, PA-C   pantoprazole  (PROTONIX ) EC tablet 80 mg, 80 mg, Oral, Q1200, Thomas, Jonathan G, MD, 80 mg at 04/23/24 0835   polyethylene glycol (MIRALAX  / GLYCOLAX ) packet 17 g, 17 g, Oral, Daily PRN, Tomlinson, Sara Caylin, PA-C, 17 g at 04/22/24 2037   rosuvastatin  (CRESTOR ) tablet 5 mg, 5 mg, Oral, Daily, Easter Golden Caylin, PA-C, 5 mg at 04/23/24 1478   sodium chloride  flush (NS) 0.9 % injection 3 mL, 3 mL, Intravenous, Q12H, Easter Golden Caylin, PA-C, 3 mL at 04/22/24 2038   sodium chloride  flush (NS) 0.9 % injection 3 mL, 3 mL, Intravenous, PRN, Tomlinson, Sara Caylin, PA-C   sodium chloride  flush (NS) 0.9 % injection 3 mL, 3 mL, Intravenous, Q12H, Easter Golden Caylin, PA-C, 3 mL at 04/23/24 0836   sodium  chloride flush (NS) 0.9 % injection 3 mL, 3 mL, Intravenous, PRN, Tomlinson, Sara Caylin, PA-C   sodium phosphate  (FLEET) enema 1 enema, 1 enema, Rectal, Once PRN, Tomlinson, Sara Caylin, PA-C  Patients Current Diet:  Diet Order             Diet regular Room service appropriate? Yes; Fluid consistency: Thin  Diet effective now                   Precautions / Restrictions Precautions Precautions: Back Precaution Booklet Issued: No Precaution/Restrictions Comments: pt able to report 1/3 precautions, reviewed verbally Spinal Brace: Lumbar corset, Applied in sitting position Restrictions Weight Bearing Restrictions Per Provider  Order: No Other Position/Activity Restrictions: +orthostatics   Has the patient had 2 or more falls or a fall with injury in the past year?Yes  Prior Activity Level Community (5-7x/wk): independent without DME, on disability, driving  Prior Functional Level Prior Function Prior Level of Function : Independent/Modified Independent Mobility Comments: Ind no AD ADLs Comments: ind  Self Care: Did the patient need help bathing, dressing, using the toilet or eating?  Independent  Indoor Mobility: Did the patient need assistance with walking from room to room (with or without device)? Independent  Stairs: Did the patient need assistance with internal or external stairs (with or without device)? Independent  Functional Cognition: Did the patient need help planning regular tasks such as shopping or remembering to take medications? Independent  Patient Information Are you of Hispanic, Latino/a,or Spanish origin?: A. No, not of Hispanic, Latino/a, or Spanish origin What is your race?: A. White Do you need or want an interpreter to communicate with a doctor or health care staff?: 0. No  Patient's Response To:  Health Literacy and Transportation Is the patient able to respond to health literacy and transportation needs?: Yes Health Literacy - How often do  you need to have someone help you when you read instructions, pamphlets, or other written material from your doctor or pharmacy?: Never In the past 12 months, has lack of transportation kept you from medical appointments or from getting medications?: No In the past 12 months, has lack of transportation kept you from meetings, work, or from getting things needed for daily living?: No  Home Assistive Devices / Equipment Home Equipment: Hand held shower head, Shower seat  Prior Device Use: Indicate devices/aids used by the patient prior to current illness, exacerbation or injury? None of the above  Current Functional Level Cognition  Orientation Level: Oriented X4    Extremity Assessment (includes Sensation/Coordination)  Upper Extremity Assessment: Overall WFL for tasks assessed  Lower Extremity Assessment: RLE deficits/detail RLE Sensation: decreased light touch RLE Coordination: decreased gross motor    ADLs  Overall ADL's : Needs assistance/impaired Eating/Feeding: Independent, Sitting Grooming: Sitting, Set up Upper Body Bathing: Sitting, Set up Lower Body Bathing: Moderate assistance, Sitting/lateral leans Upper Body Dressing : Sitting, Set up Lower Body Dressing: Sitting/lateral leans, Moderate assistance, With adaptive equipment Lower Body Dressing Details (indicate cue type and reason): Educated pt on use of shoe horn and and sock aid for LBD. Mod cues and assist for new learning. Toilet Transfer: Minimal assistance, +2 for physical assistance, Stand-pivot Toilet Transfer Details (indicate cue type and reason): limited activity restrictions per MD orders. only transfer to recliner. Toileting- Clothing Manipulation and Hygiene: Maximal assistance, Sit to/from stand Functional mobility during ADLs: Rolling walker (2 wheels), Minimal assistance General ADL Comments: Pt declined ADLs, pt open to working on OOB mobility-used chair follow since pt is +orthostatic    Mobility   Overal bed mobility: Needs Assistance Bed Mobility: Rolling, Sidelying to Sit, Sit to Sidelying Rolling: Supervision Sidelying to sit: Contact guard assist Sit to sidelying: Min assist (assist mostly with right leg) General bed mobility comments: pt able to report log roll.  Most assist to come to sitting back to supine is for R leg    Transfers  Overall transfer level: Needs assistance Equipment used: Rolling walker (2 wheels) Transfers: Sit to/from Stand, Bed to chair/wheelchair/BSC Sit to Stand: Contact guard assist Bed to/from chair/wheelchair/BSC transfer type:: Step pivot Step pivot transfers: Min assist General transfer comment: pt having difficulty progressing right leg/foot during transfers,  min assist to stabilize RW and support trunk    Ambulation / Gait / Stairs / Wheelchair Mobility  Ambulation/Gait Ambulation/Gait assistance: Min assist, +2 safety/equipment Gait Distance (Feet): 15 Feet Assistive device: Rolling walker (2 wheels) Gait Pattern/deviations: Step-through pattern, Steppage, Decreased dorsiflexion - right, Decreased stance time - right General Gait Details: chair to follow due to continued orthostatic BPs and symptoms.  Pt also go nauseated in the hallway, burped, has not had a BM yet despite attempting with us  at end of session  Refused laxative. Gait velocity: decreased Gait velocity interpretation: <1.31 ft/sec, indicative of household ambulator    Posture / Balance Balance Overall balance assessment: Needs assistance Sitting-balance support: Feet supported, No upper extremity supported Sitting balance-Leahy Scale: Good Standing balance support: Bilateral upper extremity supported Standing balance-Leahy Scale: Poor Standing balance comment: Reliant on both hands    Special needs/care consideration Skin surgical incision     Previous Home Environment (from acute therapy documentation) Living Arrangements: Spouse/significant other Available Help at  Discharge: Family, Available 24 hours/day Type of Home: House Home Layout: One level Home Access: Stairs to enter Entrance Stairs-Rails: Can reach both, Left, Right Entrance Stairs-Number of Steps: 6 front, 3 in the back Bathroom Shower/Tub: Engineer, manufacturing systems: Standard Home Care Services: No  Discharge Living Setting Plans for Discharge Living Setting: Patient's home, Lives with (comment) (spouse) Type of Home at Discharge: Mobile home Discharge Home Layout: One level Discharge Home Access: Stairs to enter Entrance Stairs-Rails: Right, Left Entrance Stairs-Number of Steps: 5-6 Discharge Bathroom Shower/Tub: Tub/shower unit Discharge Bathroom Toilet: Standard Discharge Bathroom Accessibility: Yes How Accessible: Accessible via walker Does the patient have any problems obtaining your medications?: No  Social/Family/Support Systems Patient Roles: Spouse Anticipated Caregiver: Wallene Gum Anticipated Caregiver's Contact Information: [phone] Ability/Limitations of Caregiver: none stated Caregiver Availability: 24/7 Discharge Plan Discussed with Primary Caregiver: Yes Is Caregiver In Agreement with Plan?: Yes Does Caregiver/Family have Issues with Lodging/Transportation while Pt is in Rehab?: No   Goals Patient/Family Goal for Rehab: PT/OT supervision, SLP n/a Expected length of stay: 7-10 days Additional Information: Discharge plan: home with spouse 24/7. Pt/Family Agrees to Admission and willing to participate: Yes Program Orientation Provided & Reviewed with Pt/Caregiver Including Roles  & Responsibilities: Yes   Decrease burden of Care through IP rehab admission: n/a   Possible need for SNF placement upon discharge: Not anticipated.  Plan for d/c home with spouse to provide 24/7 supervision.    Patient Condition: This patient's condition remains as documented in the consult dated 04/18/24, in which the Rehabilitation Physician determined and documented that  the patient's condition is appropriate for intensive rehabilitative care in an inpatient rehabilitation facility. Will admit to inpatient rehab today.  Preadmission Screen Completed By:  Mickey Alar, PT, DPT 04/23/2024 9:26 AM ______________________________________________________________________   Discussed status with Dr. Sharl Davies on 04/23/24 at  9:26 AM  and received approval for admission today.  Admission Coordinator:  Caitlin E Warren, PT, DPT time 9:26 AM Alanna Hu 04/23/24

## 2024-04-18 NOTE — Care Management Important Message (Signed)
 Important Message  Patient Details  Name: AYDRIC BENT MRN: [id number] Date of Birth: 09/25/1966   Important Message Given:  Yes - Medicare IM     Wynonia Hedges 04/18/2024, 8:53 AM

## 2024-04-18 NOTE — Progress Notes (Signed)
 Occupational Therapy Treatment Patient Details Name: Bradley Fisher MRN: [id number] DOB: [birth year] Today's Date: 04/18/2024   History of present illness Pt is a 58 yo male presenting [facility] on [facility] with chronic low back pain, working dx of lumbar radiculopathy. He is s/p Anterior retroperitoneal spine exposure for L4-L5 and L5-S1 ALIF on 04/11/24. PMH of DDD, HTN, seizures, OA, COPD, GERD, HLD.   OT comments  Pt making slow progress in therapy, limited by orhtostatic symptoms/vitals. Reinforced back precautions and educated pt on AE use for LBD. Pt able to stand multiple attempts with CGA but not able to progress gait secondary to dizziness when OOB. OT to continue following pt acutely to progress pt as able. Patient has the potential to reach Mod I and demos the ability to tolerate 3 hours of therapy. Pt would benefit from an intensive rehab program to help maximize functional independence.       If plan is discharge home, recommend the following:  A little help with bathing/dressing/bathroom;Assistance with cooking/housework;Assist for transportation;Help with stairs or ramp for entrance;A little help with walking and/or transfers   Equipment Recommendations  Other (comment);Tub/shower seat;BSC/3in1 (RW)    Recommendations for Other Services      Precautions / Restrictions Precautions Precautions: Back Precaution Booklet Issued: No Recall of Precautions/Restrictions: Impaired Precaution/Restrictions Comments: reviewed post op back precautions throughout session with mobility Required Braces or Orthoses: Spinal Brace Spinal Brace: Lumbar corset;Applied in sitting position Restrictions Weight Bearing Restrictions Per Provider Order: No       Mobility Bed Mobility Overal bed mobility: Needs Assistance Bed Mobility: Rolling, Sidelying to Sit Rolling: Min assist Sidelying to sit: Mod assist     Sit to sidelying: Min assist General bed mobility comments: Min/Mod A for bed  mobility with cues for sequencing to get trunk to mid line. Cues and reinforcement for log roll    Transfers Overall transfer level: Needs assistance Equipment used: Rolling walker (2 wheels) Transfers: Sit to/from Stand Sit to Stand: Min assist, +2 safety/equipment, From elevated surface           General transfer comment: Min A to stand from elevated bed. Pt reported dizziness during orthostatic check and requested to sit down. Ambulation deferred due to orthostatic.     Balance Overall balance assessment: Needs assistance Sitting-balance support: No upper extremity supported, Feet supported Sitting balance-Leahy Scale: Fair     Standing balance support: Bilateral upper extremity supported, During functional activity, Reliant on assistive device for balance Standing balance-Leahy Scale: Zero Standing balance comment: Reliant on RW and external support                           ADL either performed or assessed with clinical judgement   ADL                       Lower Body Dressing: Sitting/lateral leans;Moderate assistance;With adaptive equipment Lower Body Dressing Details (indicate cue type and reason): Educated pt on use of shoe horn and and sock aid for LBD. Mod cues and assist for new learning.                    Extremity/Trunk Assessment Upper Extremity Assessment Upper Extremity Assessment: Overall WFL for tasks assessed            Vision       Perception     Praxis     Communication Communication Communication: No apparent  difficulties   Cognition Arousal: Alert Behavior During Therapy: WFL for tasks assessed/performed Cognition: No family/caregiver present to determine baseline             OT - Cognition Comments: impaired recall of precautions                 Following commands: Impaired Following commands impaired: Follows one step commands with increased time      Cueing   Cueing Techniques: Verbal  cues  Exercises      Shoulder Instructions       General Comments BP: 121/59 seated. BP: 96/61 seated after 5 minutes of 3 unsuccessful attempts to get BP.    Pertinent Vitals/ Pain       Pain Assessment Pain Assessment: Faces Faces Pain Scale: Hurts little more Pain Location: incisions and reporting discomfort at posterior/medial proximal thigh. Reports numbness of RLE. Pain Descriptors / Indicators: Sore, Aching, Numbness Pain Intervention(s): Monitored during session, Limited activity within patient's tolerance  Home Living                                          Prior Functioning/Environment              Frequency  Min 2X/week        Progress Toward Goals  OT Goals(current goals can now be found in the care plan section)  Progress towards OT goals: Progressing toward goals  Acute Rehab OT Goals Patient Stated Goal: To go home OT Goal Formulation: With patient Time For Goal Achievement: 04/28/24 Potential to Achieve Goals: Good  Plan      Co-evaluation                 AM-PAC OT "6 Clicks" Daily Activity     Outcome Measure   Help from another person eating meals?: None Help from another person taking care of personal grooming?: A Little Help from another person toileting, which includes using toliet, bedpan, or urinal?: A Lot Help from another person bathing (including washing, rinsing, drying)?: A Lot Help from another person to put on and taking off regular upper body clothing?: A Little Help from another person to put on and taking off regular lower body clothing?: A Lot 6 Click Score: 16    End of Session Equipment Utilized During Treatment: Gait belt;Rolling walker (2 wheels);Back brace  OT Visit Diagnosis: Pain;Unsteadiness on feet (R26.81);Other abnormalities of gait and mobility (R26.89) Pain - Right/Left:  (back)   Activity Tolerance Patient tolerated treatment well   Patient Left in bed;with call bell/phone  within reach;with bed alarm set   Nurse Communication Mobility status        Time: 1308-6578 OT Time Calculation (min): 26 min  Charges: OT General Charges $OT Visit: 1 Visit OT Treatments $Therapeutic Activity: 8-22 mins  04/18/2024  AB, OTR/L  Acute Rehabilitation Services  Office: [phone]   Jorene New 04/18/2024, 12:12 PM

## 2024-04-18 NOTE — Plan of Care (Signed)
   Problem: Activity: Goal: Risk for activity intolerance will decrease Outcome: Progressing   Problem: Nutrition: Goal: Adequate nutrition will be maintained Outcome: Progressing

## 2024-04-18 NOTE — Progress Notes (Signed)
    Providing Compassionate, Quality Care - Together   NEUROSURGERY PROGRESS NOTE     S: No issues overnight.    O: EXAM:  BP 105/79 (BP Location: Left Arm)   Pulse 69   Temp 98.1 F (36.7 C) (Oral)   Resp 17   Ht 6' (1.829 m)   Wt 93 kg   SpO2 97%   BMI 27.80 kg/m     Awake, alert, oriented  Speech fluent, appropriate  BUE/BLE 5/5 SILTx4 Dressing c/d/i   ASSESSMENT:  58 y.o. s/p T10-pelvis fusion    PLAN: -Continue therapies as tolerated -CIR for dc planning vs home w/ HH PT/equipment -Supportive care -Call w/ questions/concerns.   Bradley Fisher, [facility]

## 2024-04-18 NOTE — Progress Notes (Signed)
 Physical Therapy Treatment Patient Details Name: Bradley Fisher MRN: [id number] DOB: [birth year] Today's Date: 04/18/2024   History of Present Illness Pt is a 58 yo male presenting to [facility] on 04/11/24 with chronic low back pain, working dx of lumbar radiculopathy. He is s/p Anterior retroperitoneal spine exposure for L4-L5 and L5-S1 ALIF on 04/11/24. PMH of DDD, HTN, seizures, OA, COPD, GERD, HLD.    PT Comments  Pt with poor tolerance to treatment today. Pt with similar presentation to previous session. Pt once again orthostatic and with terrible safety awareness despite max cues. BP: 121/59 seated. BP: 96/61 seated after 5 minutes of 3 unsuccessful attempts to get BP. DC recs updated to CIR as pt is agreeable to that over SNF. Pt does report that he has caregiver support at home upon DC making him a good candidate. PT will continue to follow.     If plan is discharge home, recommend the following: Assistance with cooking/housework;Assist for transportation;Help with stairs or ramp for entrance;A lot of help with walking and/or transfers   Can travel by private vehicle     No  Equipment Recommendations  Wheelchair cushion (measurements PT);Wheelchair (measurements PT);Hoyer lift;Hospital bed    Recommendations for Other Services Rehab consult     Precautions / Restrictions Precautions Precautions: Back Precaution Booklet Issued: No Recall of Precautions/Restrictions: Impaired Precaution/Restrictions Comments: reviewed post op back precautions throughout session with mobility Required Braces or Orthoses: Spinal Brace Spinal Brace: Lumbar corset;Applied in sitting position Restrictions Weight Bearing Restrictions Per Provider Order: No     Mobility  Bed Mobility Overal bed mobility: Needs Assistance Bed Mobility: Rolling, Sidelying to Sit Rolling: Min assist Sidelying to sit: Mod assist     Sit to sidelying: Min assist General bed mobility comments: Min/Mod A for bed mobility  with cues for sequencing to get trunk to mid line.    Transfers Overall transfer level: Needs assistance Equipment used: Rolling walker (2 wheels) Transfers: Sit to/from Stand Sit to Stand: Min assist, +2 safety/equipment, From elevated surface           General transfer comment: Min A to stand from elevated bed. Pt reported dizziness during orthostatic check and requested to sit down. Ambulation deferred due to orthostatic.    Ambulation/Gait               General Gait Details: Ambulation deferred due to BP.   Stairs             Wheelchair Mobility     Tilt Bed    Modified Rankin (Stroke Patients Only)       Balance Overall balance assessment: Needs assistance Sitting-balance support: No upper extremity supported, Feet supported Sitting balance-Leahy Scale: Fair     Standing balance support: Bilateral upper extremity supported, During functional activity, Reliant on assistive device for balance Standing balance-Leahy Scale: Zero Standing balance comment: Reliant on RW and external support                            Communication Communication Communication: No apparent difficulties  Cognition Arousal: Alert Behavior During Therapy: WFL for tasks assessed/performed   PT - Cognitive impairments: No family/caregiver present to determine baseline, Awareness, Safety/Judgement, Attention, Problem solving                       PT - Cognition Comments: Pt again with extremely poor safety awareness breaking his spinal precautions constantly and standing on his  own with AD. Pt also unable to recall spinal precautions once again. Following commands: Impaired Following commands impaired: Follows one step commands with increased time    Cueing Cueing Techniques: Verbal cues  Exercises      General Comments General comments (skin integrity, edema, etc.): BP: 121/59 seated. BP: 96/61 seated after 5 minutes of 3 unsuccessful attempts to  get BP.      Pertinent Vitals/Pain Pain Assessment Pain Assessment: Faces Faces Pain Scale: Hurts little more Pain Location: incisions and reporting discomfort at posterior/medial proximal thigh. Reports numbness of RLE. Pain Descriptors / Indicators: Sore, Aching, Numbness Pain Intervention(s): Monitored during session, Premedicated before session    Home Living                          Prior Function            PT Goals (current goals can now be found in the care plan section) Progress towards PT goals: Progressing toward goals    Frequency    Min 5X/week      PT Plan      Co-evaluation              AM-PAC PT "6 Clicks" Mobility   Outcome Measure  Help needed turning from your back to your side while in a flat bed without using bedrails?: A Little Help needed moving from lying on your back to sitting on the side of a flat bed without using bedrails?: A Little Help needed moving to and from a bed to a chair (including a wheelchair)?: A Little Help needed standing up from a chair using your arms (e.g., wheelchair or bedside chair)?: A Little Help needed to walk in hospital room?: A Lot Help needed climbing 3-5 steps with a railing? : Total 6 Click Score: 15    End of Session Equipment Utilized During Treatment: Back brace;Gait belt Activity Tolerance: Patient tolerated treatment well Patient left: in bed;with call bell/phone within reach;with bed alarm set Nurse Communication: Mobility status PT Visit Diagnosis: Other abnormalities of gait and mobility (R26.89)     Time: 6962-9528 PT Time Calculation (min) (ACUTE ONLY): 25 min  Charges:    $Therapeutic Activity: 8-22 mins PT General Charges $$ ACUTE PT VISIT: 1 Visit                     [name], PT, DPT Acute Rehab Services [phone]    Bradley Fisher 04/18/2024, 11:41 AM

## 2024-04-19 ENCOUNTER — Encounter (HOSPITAL_COMMUNITY): Payer: Self-pay | Admitting: Neurosurgery

## 2024-04-19 LAB — HEMOGLOBIN AND HEMATOCRIT, BLOOD
HCT: 26.9 % — ABNORMAL LOW (ref 39.0–52.0)
Hemoglobin: 9.2 g/dL — ABNORMAL LOW (ref 13.0–17.0)

## 2024-04-19 NOTE — Progress Notes (Signed)
 Physical Therapy Treatment Patient Details Name: Bradley Fisher MRN: [id number] DOB: 1966/08/20 Today's Date: 04/19/2024   History of Present Illness Pt is a 58 yo male presenting to [facility] on 04/11/24 with chronic low back pain, working dx of lumbar radiculopathy. He is s/p Anterior retroperitoneal spine exposure for L4-L5 and L5-S1 ALIF on 04/11/24. PMH of DDD, HTN, seizures, OA, COPD, GERD, HLD.    PT Comments  Pt with poor tolerance to treatment today. Pt with similar presentation to previous session. Pt again limited by low BP. BP: 101/72 supine, BP: 85/68 seated, BP: 112/79 return to supine. RN made aware. No change in DC/DME recs at this time. PT will continue to follow.     If plan is discharge home, recommend the following: Assistance with cooking/housework;Assist for transportation;Help with stairs or ramp for entrance;A lot of help with walking and/or transfers   Can travel by private vehicle     No  Equipment Recommendations  Wheelchair cushion (measurements PT);Wheelchair (measurements PT);Hoyer lift;Hospital bed    Recommendations for Other Services       Precautions / Restrictions Precautions Precautions: Back Precaution Booklet Issued: No Recall of Precautions/Restrictions: Impaired Precaution/Restrictions Comments: reviewed post op back precautions throughout session with mobility Required Braces or Orthoses: Spinal Brace Spinal Brace: Lumbar corset;Applied in sitting position Restrictions Weight Bearing Restrictions Per Provider Order: No     Mobility  Bed Mobility Overal bed mobility: Needs Assistance Bed Mobility: Rolling, Sidelying to Sit, Sit to Sidelying Rolling: Contact guard assist Sidelying to sit: Contact guard assist     Sit to sidelying: Contact guard assist, Min assist General bed mobility comments: Constant cues for spinal precautions however no physical assist provided.    Transfers                   General transfer comment:  Deferred due to BP    Ambulation/Gait               General Gait Details: Ambulation deferred due to BP.   Stairs             Wheelchair Mobility     Tilt Bed    Modified Rankin (Stroke Patients Only)       Balance Overall balance assessment: Needs assistance Sitting-balance support: No upper extremity supported, Feet supported Sitting balance-Leahy Scale: Fair                                      Hotel manager: No apparent difficulties  Cognition Arousal: Alert Behavior During Therapy: WFL for tasks assessed/performed   PT - Cognitive impairments: No family/caregiver present to determine baseline, Awareness, Safety/Judgement, Attention, Problem solving                       PT - Cognition Comments: Pt again with extremely poor safety awareness breaking his spinal precautions constantly and standing on his own with AD. Pt also unable to recall spinal precautions once again. Following commands: Impaired Following commands impaired: Follows one step commands with increased time    Cueing Cueing Techniques: Verbal cues  Exercises      General Comments General comments (skin integrity, edema, etc.): BP: 101/72 supine, BP: 85/68 seated, BP: 112/79 return to supine.      Pertinent Vitals/Pain Pain Assessment Pain Assessment: Faces Faces Pain Scale: Hurts little more Pain Location: incisions and reporting discomfort at posterior/medial proximal  thigh. Reports numbness of RLE. Pain Descriptors / Indicators: Sore, Aching, Numbness Pain Intervention(s): Monitored during session, Limited activity within patient's tolerance, Premedicated before session    Home Living                          Prior Function            PT Goals (current goals can now be found in the care plan section) Progress towards PT goals: Progressing toward goals    Frequency    Min 5X/week      PT Plan       Co-evaluation              AM-PAC PT "6 Clicks" Mobility   Outcome Measure  Help needed turning from your back to your side while in a flat bed without using bedrails?: A Little Help needed moving from lying on your back to sitting on the side of a flat bed without using bedrails?: A Little Help needed moving to and from a bed to a chair (including a wheelchair)?: A Little Help needed standing up from a chair using your arms (e.g., wheelchair or bedside chair)?: A Little Help needed to walk in hospital room?: A Lot Help needed climbing 3-5 steps with a railing? : Total 6 Click Score: 15    End of Session Equipment Utilized During Treatment: Back brace Activity Tolerance: Treatment limited secondary to medical complications (Comment) Patient left: in bed;with call bell/phone within reach;with bed alarm set Nurse Communication: Mobility status PT Visit Diagnosis: Other abnormalities of gait and mobility (R26.89)     Time: 8469-6295 PT Time Calculation (min) (ACUTE ONLY): 13 min  Charges:    $Therapeutic Activity: 8-22 mins PT General Charges $$ ACUTE PT VISIT: 1 Visit                     Bradley Fisher, PT, DPT Acute Rehab Services [phone]    Bradley Fisher 04/19/2024, 3:43 PM

## 2024-04-19 NOTE — Progress Notes (Addendum)
 Inpatient Rehab Admissions Coordinator:   Awaiting determination from [facility] Medicare regarding CIR prior auth request.  Will continue to follow.   1528: I did receive approval from insurance following peer to peer discussion with Dr. Lovorn.  I do not have a bed for this patient to admit today but hopeful for tomorrow.  I updated wife on the phone.  Will follow.   Loye Rumble, PT, DPT Admissions Coordinator [phone] 04/19/24  9:14 AM

## 2024-04-19 NOTE — Progress Notes (Signed)
    Providing Compassionate, Quality Care - Together   NEUROSURGERY PROGRESS NOTE     S: No issues overnight.    O: EXAM:  BP 112/64 (BP Location: Left Arm)   Pulse 65   Temp 97.8 F (36.6 C) (Oral)   Resp 18   Ht 6' (1.829 m)   Wt 93 kg   SpO2 100%   BMI 27.80 kg/m     Awake, alert, oriented  Speech fluent, appropriate  BUE/BLE 5/5 SILTx4 Dressing c/d/i   ASSESSMENT:  58 y.o. s/p T10-pelvis fusion     PLAN: -Continue therapies as tolerated -CIR for dc planning vs home w/ HH PT/equipment -Supportive care -Call w/ questions/concerns.   Bradley Fisher, [facility]

## 2024-04-19 NOTE — TOC Progression Note (Signed)
 Transition of Care [facility]) - Progression Note    Patient Details  Name: BAELIN PETROSSIAN MRN: [id number] Date of Birth: 05-17-1966  Transition of Care [facility] CM/SW Contact  Jonathan Neighbor, RN Phone Number: 04/19/2024, 2:39 PM  Clinical Narrative:     Pt has insurance auth for CIR. CIR doesn't have a bed today but hope for admit tomorrow.  TOC following.  Expected Discharge Plan: IP Rehab Facility Barriers to Discharge: Continued Medical Work up  Expected Discharge Plan and Services In-house Referral: Clinical Social Work Discharge Planning Services: CM Consult Post Acute Care Choice: Home Health, Durable Medical Equipment Living arrangements for the past 2 months: Single Family Home                 DME Arranged: Bedside commode, Wheelchair manual, Other see comment (hoyer lift) DME Agency: [employer] Date DME Agency Contacted: 04/16/24   Representative spoke with at DME Agency: [facility] HH Arranged: PT, OT, Nurse's Aide HH Agency: [facility] Date [facility] Agency Contacted: 04/16/24   Representative spoke with at [facility] Agency: Randel Buss   Social Determinants of Health (SDOH) Interventions SDOH Screenings   Food Insecurity: No Food Insecurity (04/12/2024)  Housing: Low Risk  (04/12/2024)  Transportation Needs: No Transportation Needs (04/12/2024)  Utilities: Not At Risk (04/12/2024)  Depression (PHQ2-9): Low Risk  (05/20/2021)  Financial Resource Strain: Low Risk  (02/12/2019)  Physical Activity: Inactive (02/12/2019)  Social Connections: Moderately Integrated (02/12/2019)  Stress: No Stress Concern Present (02/12/2019)  Tobacco Use: High Risk (04/13/2024)    Readmission Risk Interventions     No data to display

## 2024-04-20 NOTE — Progress Notes (Addendum)
 Inpatient Rehab Admissions Coordinator:   I have a bed for this pt to admit to CIR on Saturday (5/10).  Dr. Andy Bannister and Easter Golden, PA-C, in agreement.  Rehab MD (Dr. Raynaldo Call) to assess pt and confirm admission on Saturday.  Floor RN can call CIR at [phone] for report after 12pm on Saturday.  I have let pt/family and case manager know.    11:30 our planned discharge for Saturday has been cancelled, so I don't have a bed for Mr. Schremp to admit tomorrow.  Wandalee Gust will follow over the weekend in case CIR has an unexpected discharge.    Loye Rumble, PT, DPT Admissions Coordinator [phone] 04/20/24  11:12 AM

## 2024-04-20 NOTE — Progress Notes (Signed)
 Physical Therapy Treatment Patient Details Name: Bradley Fisher MRN: [id number] DOB: [birth year] Today's Date: 04/20/2024   History of Present Illness Pt is a 58 yo male presenting to [facility] on 04/11/24 with chronic low back pain, working dx of lumbar radiculopathy. He underwent L1-S1 ALIF 4/30 and T10-S1 PLIF 5/2. PMH of DDD, HTN, seizures, OA, COPD, GERD, HLD.    PT Comments  Pt received in bed. Gait progression continues to be limited by symptomatic +orthostatics. He required supervision rolling, CGA sidelying to sit, min assist sit to stand from elevated surface, and min assist sit to sidelying. BP supine 121/76, sitting 116/69, and standing 65/44. Pt supine in bed at end of session.     If plan is discharge home, recommend the following: Assistance with cooking/housework;Assist for transportation;Help with stairs or ramp for entrance;A lot of help with walking and/or transfers   Can travel by private vehicle     No  Equipment Recommendations  Wheelchair cushion (measurements PT);Wheelchair (measurements PT);Hospital bed    Recommendations for Other Services       Precautions / Restrictions Precautions Precautions: Back Recall of Precautions/Restrictions: Impaired Precaution/Restrictions Comments: reviewed 3/3 back precautions Required Braces or Orthoses: Spinal Brace Spinal Brace: Lumbar corset;Applied in sitting position Restrictions Other Position/Activity Restrictions: +orthostatics     Mobility  Bed Mobility Overal bed mobility: Needs Assistance Bed Mobility: Rolling, Sidelying to Sit, Sit to Sidelying Rolling: Supervision, Used rails Sidelying to sit: Contact guard assist     Sit to sidelying: Min assist, Used rails General bed mobility comments: assist with RLE, cues for sequencing    Transfers Overall transfer level: Needs assistance Equipment used: Rolling walker (2 wheels) Transfers: Sit to/from Stand Sit to Stand: Min assist, From elevated surface            General transfer comment: unable to progress beyond static stand bedside due to symptomatic +orthostatics    Ambulation/Gait                   Stairs             Wheelchair Mobility     Tilt Bed    Modified Rankin (Stroke Patients Only)       Balance Overall balance assessment: Needs assistance Sitting-balance support: No upper extremity supported, Feet supported Sitting balance-Leahy Scale: Fair     Standing balance support: Bilateral upper extremity supported, During functional activity, Reliant on assistive device for balance Standing balance-Leahy Scale: Poor                              Communication Communication Communication: Impaired Factors Affecting Communication: Reduced clarity of speech  Cognition Arousal: Alert Behavior During Therapy: WFL for tasks assessed/performed, Impulsive   PT - Cognitive impairments: No family/caregiver present to determine baseline, Awareness, Safety/Judgement, Attention, Problem solving                         Following commands: Impaired Following commands impaired: Follows one step commands with increased time    Cueing Cueing Techniques: Verbal cues  Exercises      General Comments General comments (skin integrity, edema, etc.): BP supine 121/76, sitting 116/69, standing 65/44. Pt with c/o dizziness standing requiring return to supine.      Pertinent Vitals/Pain Pain Assessment Pain Assessment: Faces Faces Pain Scale: Hurts little more Pain Location: back Pain Descriptors / Indicators: Sore Pain Intervention(s): Monitored during session  Home Living                          Prior Function            PT Goals (current goals can now be found in the care plan section) Acute Rehab PT Goals Patient Stated Goal: home Progress towards PT goals: Progressing toward goals    Frequency    Min 5X/week      PT Plan      Co-evaluation               AM-PAC PT "6 Clicks" Mobility   Outcome Measure  Help needed turning from your back to your side while in a flat bed without using bedrails?: A Little Help needed moving from lying on your back to sitting on the side of a flat bed without using bedrails?: A Little Help needed moving to and from a bed to a chair (including a wheelchair)?: A Lot Help needed standing up from a chair using your arms (e.g., wheelchair or bedside chair)?: A Little Help needed to walk in hospital room?: Total Help needed climbing 3-5 steps with a railing? : Total 6 Click Score: 13    End of Session Equipment Utilized During Treatment: Back brace Activity Tolerance: Treatment limited secondary to medical complications (Comment) (+orthostatics) Patient left: in bed;with call bell/phone within reach;with bed alarm set Nurse Communication: Mobility status PT Visit Diagnosis: Other abnormalities of gait and mobility (R26.89)     Time: 2130-8657 PT Time Calculation (min) (ACUTE ONLY): 18 min  Charges:    $Therapeutic Activity: 8-22 mins PT General Charges $$ ACUTE PT VISIT: 1 Visit                     Dorothye Gathers., PT  Office # [phone]    Guadelupe Leech 04/20/2024, 12:30 PM

## 2024-04-20 NOTE — Progress Notes (Signed)
 Pt seen and examined.  Back pain well controlled.  Some right ant thigh dysesthesias, psoas pain with hip flexion  S/p L1-S1 interbody fusion, T10-ilium fusion -awaits CIR

## 2024-04-20 NOTE — Progress Notes (Signed)
 Occupational Therapy Treatment Patient Details Name: Bradley Fisher MRN: [id number] DOB: Nov 03, 1966 Today's Date: 04/20/2024   History of present illness Pt is a 58 yo male presenting to [facility] on 04/11/24 with chronic low back pain, working dx of lumbar radiculopathy. He underwent L1-S1 ALIF 4/30 and T10-S1 PLIF 5/2. PMH of DDD, HTN, seizures, OA, COPD, GERD, HLD.   OT comments  Pt with little interest in completing ADLs this session, open to mobility. Pt +orthostatic, used chair follow to assist with progressing gait. Pt only able to ambulate ~57ft at a time, required 2 seated rest breaks to ambulate to doorway, he would get symptomatic then rest, BP would return to normal levels once seated. He continues to need min cues to remind him to avoid twisting. OT continue to progress pt as able, DC plans remain appropriate for CIR.       If plan is discharge home, recommend the following:  A little help with bathing/dressing/bathroom;Assistance with cooking/housework;Assist for transportation;Help with stairs or ramp for entrance;A little help with walking and/or transfers   Equipment Recommendations  Other (comment);Tub/shower seat;BSC/3in1 (RW)    Recommendations for Other Services      Precautions / Restrictions Precautions Precautions: Back Precaution Booklet Issued: No Recall of Precautions/Restrictions: Impaired Precaution/Restrictions Comments: reviewed 3/3 back precautions Required Braces or Orthoses: Spinal Brace Spinal Brace: Lumbar corset;Applied in sitting position Restrictions Weight Bearing Restrictions Per Provider Order: No Other Position/Activity Restrictions: +orthostatics       Mobility Bed Mobility Overal bed mobility: Needs Assistance Bed Mobility: Rolling, Sidelying to Sit, Sit to Sidelying Rolling: Supervision, Used rails Sidelying to sit: Contact guard assist     Sit to sidelying: Contact guard assist, Used rails General bed mobility comments: Pt needing  cues to remember log roll with return to supine.    Transfers Overall transfer level: Needs assistance Equipment used: Rolling walker (2 wheels) Transfers: Sit to/from Stand, Bed to chair/wheelchair/BSC Sit to Stand: Min assist     Step pivot transfers: Contact guard assist     General transfer comment: STS from EOB was CGA, STS from recliner required min A with LLE noted to buckle, second stand from recliner to return to bed was CGA. Pt did very well with hand placement, no cues needed.     Balance Overall balance assessment: Needs assistance Sitting-balance support: No upper extremity supported, Feet supported Sitting balance-Leahy Scale: Fair     Standing balance support: Bilateral upper extremity supported, During functional activity, Reliant on assistive device for balance Standing balance-Leahy Scale: Poor                             ADL either performed or assessed with clinical judgement   ADL                                       Functional mobility during ADLs: Rolling walker (2 wheels);Minimal assistance General ADL Comments: Pt declined ADLs, pt open to working on OOB mobility-used chair follow since pt is +orthostatic    Armed forces technical officer Communication: Impaired Factors Affecting Communication: Reduced clarity of speech   Cognition Arousal: Alert Behavior During Therapy: WFL for tasks assessed/performed Cognition: No family/caregiver  present to determine baseline             OT - Cognition Comments: impaired recall of precautions                 Following commands: Impaired Following commands impaired: Follows one step commands with increased time      Cueing   Cueing Techniques: Verbal cues  Exercises      Shoulder Instructions       General Comments unable to assess any standing BP as pt not able to stand  long enough for the entire time of BP check (as the machine takes a whole 5 minutes to acquire one). During times of Bp check the pt would be returned to sitting secondary to prolonged standing intolerance and his BP would typically read greater than 100/60    Pertinent Vitals/ Pain       Pain Assessment Pain Assessment: Faces Faces Pain Scale: Hurts little more Pain Location: back and L knee Pain Descriptors / Indicators: Aching Pain Intervention(s): Monitored during session  Home Living                                          Prior Functioning/Environment              Frequency  Min 2X/week        Progress Toward Goals  OT Goals(current goals can now be found in the care plan section)  Progress towards OT goals: Progressing toward goals  Acute Rehab OT Goals Patient Stated Goal: To go home Time For Goal Achievement: 04/28/24 Potential to Achieve Goals: Good  Plan      Co-evaluation                 AM-PAC OT "6 Clicks" Daily Activity     Outcome Measure   Help from another person eating meals?: None Help from another person taking care of personal grooming?: A Little Help from another person toileting, which includes using toliet, bedpan, or urinal?: A Lot Help from another person bathing (including washing, rinsing, drying)?: A Lot Help from another person to put on and taking off regular upper body clothing?: A Little Help from another person to put on and taking off regular lower body clothing?: A Lot 6 Click Score: 16    End of Session Equipment Utilized During Treatment: Gait belt;Rolling walker (2 wheels);Back brace  OT Visit Diagnosis: Pain;Unsteadiness on feet (R26.81);Other abnormalities of gait and mobility (R26.89) Pain - Right/Left:  (back)   Activity Tolerance Patient tolerated treatment well   Patient Left in bed;with call bell/phone within reach;with bed alarm set   Nurse Communication Mobility status         Time: 1415-1440 OT Time Calculation (min): 25 min  Charges: OT General Charges $OT Visit: 1 Visit OT Treatments $Therapeutic Activity: 23-37 mins  04/20/2024  AB, OTR/L  Acute Rehabilitation Services  Office: [phone]   Jorene New 04/20/2024, 3:47 PM

## 2024-04-21 MED ORDER — BACITRACIN ZINC 500 UNIT/GM EX OINT
TOPICAL_OINTMENT | Freq: Two times a day (BID) | CUTANEOUS | Status: DC
  Administered 2024-04-21 – 2024-04-23 (×5): 31.5 via TOPICAL
  Filled 2024-04-21: qty 28.4

## 2024-04-21 MED ORDER — MAGNESIUM CITRATE PO SOLN
1.0000 | Freq: Once | ORAL | Status: AC
  Administered 2024-04-23: 1 via ORAL
  Filled 2024-04-21 (×2): qty 296

## 2024-04-21 MED ORDER — PANTOPRAZOLE SODIUM 40 MG PO TBEC
80.0000 mg | DELAYED_RELEASE_TABLET | Freq: Every day | ORAL | Status: DC
  Administered 2024-04-21 – 2024-04-23 (×3): 80 mg via ORAL
  Filled 2024-04-21 (×2): qty 2

## 2024-04-21 NOTE — Progress Notes (Signed)
 Pt. C/o sore inside of his nose and requesting a dulcolax suppository. Message left with neurosurgery answering service.

## 2024-04-21 NOTE — Progress Notes (Signed)
 Physical Therapy Treatment Patient Details Name: Bradley Fisher MRN: [id number] DOB: 09-Dec-1966 Today's Date: 04/21/2024   History of Present Illness Pt is a 58 yo male presenting to [facility] on 04/11/24 with chronic low back pain, working dx of lumbar radiculopathy. He underwent L1-S1 ALIF 4/30 and T10-S1 PLIF 5/2. PMH of DDD, HTN, seizures, OA, COPD, GERD, HLD.    PT Comments  Pt is progressing slowly, but daily with gait and mobility.  He remains limited by positive orthostatic vitals (see below).  His right leg is numb from hip to feet and has decreased active hip, knee and ankle strength. He is able to use a steppage gait strategy to clear his foot when cued.  He is excited to possibly d/c to CIR today.   04/21/24 0900  Orthostatic Lying   BP- Lying 135/80  Pulse- Lying 64  Orthostatic Sitting  BP- Sitting 139/84  Pulse- Sitting 77  Orthostatic Standing at 3 minutes  BP- Standing at 3 minutes 111/61 (immediately upon sitting when symptomatic while walking)  Oxygen Therapy  SpO2 100 %      If plan is discharge home, recommend the following: Assistance with cooking/housework;Assist for transportation;Help with stairs or ramp for entrance;A lot of help with walking and/or transfers   Can travel by private vehicle     No  Equipment Recommendations  Hospital bed;Rolling walker (2 wheels);BSC/3in1    Recommendations for Other Services Rehab consult     Precautions / Restrictions Precautions Precautions: Back Precaution Booklet Issued: No Precaution/Restrictions Comments: pt able to report 1/3 precautions, reviewed verbally Required Braces or Orthoses: Spinal Brace Spinal Brace: Lumbar corset;Applied in sitting position     Mobility  Bed Mobility Overal bed mobility: Needs Assistance Bed Mobility: Rolling, Sidelying to Sit, Sit to Sidelying Rolling: Supervision Sidelying to sit: Contact guard assist     Sit to sidelying: Min assist (assist mostly with right  leg) General bed mobility comments: pt able to report log roll.  Most assist to come to sitting back to supine is for R leg    Transfers Overall transfer level: Needs assistance Equipment used: Rolling walker (2 wheels) Transfers: Sit to/from Stand, Bed to chair/wheelchair/BSC Sit to Stand: Contact guard assist   Step pivot transfers: Min assist       General transfer comment: pt having difficulty progressing right leg/foot during transfers, min assist to stabilize RW and support trunk    Ambulation/Gait Ambulation/Gait assistance: Min assist, +2 safety/equipment Gait Distance (Feet): 15 Feet Assistive device: Rolling walker (2 wheels) Gait Pattern/deviations: Step-through pattern, Steppage, Decreased dorsiflexion - right, Decreased stance time - right Gait velocity: decreased     General Gait Details: chair to follow due to continued orthostatic BPs and symptoms.  Pt also go nauseated in the hallway, burped, has not had a BM yet despite attempting with us  at end of session  Refused laxative.   Stairs             Wheelchair Mobility     Tilt Bed    Modified Rankin (Stroke Patients Only)       Balance Overall balance assessment: Needs assistance Sitting-balance support: Feet supported, No upper extremity supported Sitting balance-Leahy Scale: Good     Standing balance support: Bilateral upper extremity supported Standing balance-Leahy Scale: Poor Standing balance comment: Reliant on both hands                            Communication Communication Communication:  Impaired Factors Affecting Communication: Other (comment) (no teeth,)  Cognition Arousal: Alert Behavior During Therapy: WFL for tasks assessed/performed   PT - Cognitive impairments: No family/caregiver present to determine baseline, Awareness, Safety/Judgement, Attention, Problem solving                         Following commands: Intact      Cueing Cueing  Techniques: Verbal cues, Gestural cues, Tactile cues  Exercises      General Comments        Pertinent Vitals/Pain Pain Assessment Pain Assessment: 0-10 Pain Score: 10-Worst pain ever Pain Location: incisional and right leg Pain Descriptors / Indicators: Burning, Aching, Numbness Pain Intervention(s): Limited activity within patient's tolerance, Monitored during session, Repositioned, Premedicated before session    Home Living                          Prior Function            PT Goals (current goals can now be found in the care plan section) Acute Rehab PT Goals Patient Stated Goal: to go to inpatient rehab today so he can go home soon Progress towards PT goals: Progressing toward goals    Frequency    Min 5X/week      PT Plan      Co-evaluation              AM-PAC PT "6 Clicks" Mobility   Outcome Measure  Help needed turning from your back to your side while in a flat bed without using bedrails?: A Little Help needed moving from lying on your back to sitting on the side of a flat bed without using bedrails?: A Little Help needed moving to and from a bed to a chair (including a wheelchair)?: A Little Help needed standing up from a chair using your arms (e.g., wheelchair or bedside chair)?: A Little Help needed to walk in hospital room?: Total Help needed climbing 3-5 steps with a railing? : Total 6 Click Score: 14    End of Session Equipment Utilized During Treatment: Back brace Activity Tolerance: Patient limited by pain Patient left: in bed;with call bell/phone within reach;with bed alarm set Nurse Communication: Mobility status PT Visit Diagnosis: Other abnormalities of gait and mobility (R26.89)     Time: 9147-8295 PT Time Calculation (min) (ACUTE ONLY): 48 min  Charges:    $Gait Training: 8-22 mins PT General Charges $$ ACUTE PT VISIT: 1 Visit                     Alveria Avena, PT, DPT  Acute Rehabilitation Secure chat is best  for contact #[phone] office

## 2024-04-21 NOTE — Progress Notes (Signed)
 Patient ID: Bradley Fisher, male   DOB: 12/24/1965, 58 y.o.   MRN: [id number] BP 117/72 (BP Location: Left Arm)   Pulse 60   Temp 98.3 F (36.8 C) (Oral)   Resp 20   Ht 6' (1.829 m)   Wt 93 kg   SpO2 100%   BMI 27.80 kg/m  Alert and oriented x4 Moving lower extremities Waiting on placement

## 2024-04-22 NOTE — Progress Notes (Signed)
 Patient ID: Bradley Fisher, male   DOB: [birth year], 58 y.o.   MRN: [id number] BP 120/68 (BP Location: Left Arm)   Pulse 69   Temp 98.3 F (36.8 C) (Oral)   Resp 16   Ht 6' (1.829 m)   Wt 93 kg   SpO2 100%   BMI 27.80 kg/m  Alert and oriented x 4 Wound is clean, dry, no signs of infection Moving lower extremities well Waiting for placement

## 2024-04-22 NOTE — Plan of Care (Signed)

## 2024-04-23 ENCOUNTER — Other Ambulatory Visit: Payer: Self-pay

## 2024-04-23 ENCOUNTER — Inpatient Hospital Stay (HOSPITAL_COMMUNITY)
Admission: AD | Admit: 2024-04-23 | Discharge: 2024-04-30 | DRG: 092 | Disposition: A | Discharge status: Home Health | Source: Intra-hospital | Attending: Physical Medicine and Rehabilitation | Admitting: Physical Medicine and Rehabilitation

## 2024-04-23 ENCOUNTER — Encounter (HOSPITAL_COMMUNITY): Payer: Self-pay | Admitting: Physical Medicine and Rehabilitation

## 2024-04-23 DIAGNOSIS — M4804 Spinal stenosis, thoracic region: Secondary | ICD-10-CM | POA: Diagnosis present

## 2024-04-23 DIAGNOSIS — F1010 Alcohol abuse, uncomplicated: Secondary | ICD-10-CM | POA: Diagnosis present

## 2024-04-23 DIAGNOSIS — M4807 Spinal stenosis, lumbosacral region: Secondary | ICD-10-CM | POA: Diagnosis present

## 2024-04-23 DIAGNOSIS — M5416 Radiculopathy, lumbar region: Secondary | ICD-10-CM | POA: Diagnosis not present

## 2024-04-23 DIAGNOSIS — M4800 Spinal stenosis, site unspecified: Secondary | ICD-10-CM | POA: Diagnosis present

## 2024-04-23 DIAGNOSIS — D62 Acute posthemorrhagic anemia: Secondary | ICD-10-CM | POA: Diagnosis present

## 2024-04-23 DIAGNOSIS — F17200 Nicotine dependence, unspecified, uncomplicated: Secondary | ICD-10-CM | POA: Diagnosis present

## 2024-04-23 DIAGNOSIS — M4805 Spinal stenosis, thoracolumbar region: Secondary | ICD-10-CM | POA: Diagnosis present

## 2024-04-23 DIAGNOSIS — F1011 Alcohol abuse, in remission: Secondary | ICD-10-CM

## 2024-04-23 DIAGNOSIS — E871 Hypo-osmolality and hyponatremia: Secondary | ICD-10-CM | POA: Diagnosis present

## 2024-04-23 DIAGNOSIS — K5900 Constipation, unspecified: Secondary | ICD-10-CM | POA: Diagnosis not present

## 2024-04-23 DIAGNOSIS — Z79899 Other long term (current) drug therapy: Secondary | ICD-10-CM | POA: Diagnosis not present

## 2024-04-23 DIAGNOSIS — I10 Essential (primary) hypertension: Secondary | ICD-10-CM | POA: Diagnosis present

## 2024-04-23 DIAGNOSIS — M1990 Unspecified osteoarthritis, unspecified site: Secondary | ICD-10-CM

## 2024-04-23 DIAGNOSIS — F32A Depression, unspecified: Secondary | ICD-10-CM | POA: Diagnosis present

## 2024-04-23 DIAGNOSIS — M48061 Spinal stenosis, lumbar region without neurogenic claudication: Secondary | ICD-10-CM | POA: Diagnosis present

## 2024-04-23 DIAGNOSIS — E785 Hyperlipidemia, unspecified: Secondary | ICD-10-CM | POA: Diagnosis present

## 2024-04-23 DIAGNOSIS — Z981 Arthrodesis status: Secondary | ICD-10-CM | POA: Diagnosis not present

## 2024-04-23 DIAGNOSIS — J4489 Other specified chronic obstructive pulmonary disease: Secondary | ICD-10-CM | POA: Diagnosis present

## 2024-04-23 DIAGNOSIS — M545 Low back pain, unspecified: Secondary | ICD-10-CM | POA: Diagnosis not present

## 2024-04-23 DIAGNOSIS — G40909 Epilepsy, unspecified, not intractable, without status epilepticus: Secondary | ICD-10-CM | POA: Diagnosis present

## 2024-04-23 DIAGNOSIS — M4726 Other spondylosis with radiculopathy, lumbar region: Secondary | ICD-10-CM | POA: Diagnosis present

## 2024-04-23 DIAGNOSIS — K219 Gastro-esophageal reflux disease without esophagitis: Secondary | ICD-10-CM | POA: Diagnosis present

## 2024-04-23 DIAGNOSIS — R2689 Other abnormalities of gait and mobility: Principal | ICD-10-CM | POA: Diagnosis present

## 2024-04-23 DIAGNOSIS — J449 Chronic obstructive pulmonary disease, unspecified: Secondary | ICD-10-CM

## 2024-04-23 DIAGNOSIS — Z833 Family history of diabetes mellitus: Secondary | ICD-10-CM

## 2024-04-23 DIAGNOSIS — R0600 Dyspnea, unspecified: Secondary | ICD-10-CM

## 2024-04-23 DIAGNOSIS — G8918 Other acute postprocedural pain: Secondary | ICD-10-CM

## 2024-04-23 DIAGNOSIS — G8929 Other chronic pain: Secondary | ICD-10-CM

## 2024-04-23 DIAGNOSIS — Z8249 Family history of ischemic heart disease and other diseases of the circulatory system: Secondary | ICD-10-CM

## 2024-04-23 DIAGNOSIS — M7989 Other specified soft tissue disorders: Secondary | ICD-10-CM | POA: Diagnosis not present

## 2024-04-23 LAB — CBC
HCT: 29.2 % — ABNORMAL LOW (ref 39.0–52.0)
Hemoglobin: 10 g/dL — ABNORMAL LOW (ref 13.0–17.0)
MCH: 29.8 pg (ref 26.0–34.0)
MCHC: 34.2 g/dL (ref 30.0–36.0)
MCV: 86.9 fL (ref 80.0–100.0)
Platelets: 519 10*3/uL — ABNORMAL HIGH (ref 150–400)
RBC: 3.36 MIL/uL — ABNORMAL LOW (ref 4.22–5.81)
RDW: 12.7 % (ref 11.5–15.5)
WBC: 11 10*3/uL — ABNORMAL HIGH (ref 4.0–10.5)
nRBC: 0 % (ref 0.0–0.2)

## 2024-04-23 LAB — CREATININE, SERUM
Creatinine, Ser: 0.64 mg/dL (ref 0.61–1.24)
GFR, Estimated: >60 mL/min (ref >60)

## 2024-04-23 MED ORDER — DOCUSATE SODIUM 100 MG PO CAPS
100.0000 mg | ORAL_CAPSULE | Freq: Two times a day (BID) | ORAL | Status: DC
  Filled 2024-04-23 (×2): qty 1

## 2024-04-23 MED ORDER — ENOXAPARIN SODIUM 40 MG/0.4ML IJ SOSY
40.0000 mg | PREFILLED_SYRINGE | INTRAMUSCULAR | Status: DC
  Administered 2024-04-24 – 2024-04-30 (×7): 40 mg via SUBCUTANEOUS
  Filled 2024-04-23 (×7): qty 0.4

## 2024-04-23 MED ORDER — AMLODIPINE BESYLATE 10 MG PO TABS
10.0000 mg | ORAL_TABLET | Freq: Every day | ORAL | Status: DC
  Administered 2024-04-24 – 2024-04-30 (×7): 10 mg via ORAL
  Filled 2024-04-23 (×7): qty 1

## 2024-04-23 MED ORDER — GABAPENTIN 400 MG PO CAPS
400.0000 mg | ORAL_CAPSULE | Freq: Three times a day (TID) | ORAL | Status: DC
  Administered 2024-04-23 – 2024-04-30 (×20): 400 mg via ORAL
  Filled 2024-04-23 (×19): qty 1

## 2024-04-23 MED ORDER — ACETAMINOPHEN 325 MG PO TABS: 650.0000 mg | ORAL_TABLET | ORAL | Status: AC | PRN

## 2024-04-23 MED ORDER — IRBESARTAN 150 MG PO TABS
150.0000 mg | ORAL_TABLET | Freq: Every day | ORAL | Status: DC
  Administered 2024-04-24 – 2024-04-30 (×7): 150 mg via ORAL
  Filled 2024-04-23 (×7): qty 1

## 2024-04-23 MED ORDER — POLYETHYLENE GLYCOL 3350 17 G PO PACK: 17.0000 g | PACK | Freq: Every day | ORAL | Status: DC | PRN

## 2024-04-23 MED ORDER — PANTOPRAZOLE SODIUM 40 MG PO TBEC
80.0000 mg | DELAYED_RELEASE_TABLET | Freq: Every day | ORAL | Status: DC
  Administered 2024-04-24 – 2024-04-30 (×7): 80 mg via ORAL
  Filled 2024-04-23 (×5): qty 2

## 2024-04-23 MED ORDER — OXYCODONE HCL 5 MG PO TABS
10.0000 mg | ORAL_TABLET | ORAL | Status: DC | PRN
  Administered 2024-04-23 – 2024-04-26 (×9): 10 mg via ORAL
  Filled 2024-04-23 (×9): qty 2

## 2024-04-23 MED ORDER — ONDANSETRON HCL 4 MG PO TABS: 4.0000 mg | ORAL_TABLET | Freq: Four times a day (QID) | ORAL | Status: DC | PRN

## 2024-04-23 MED ORDER — ROSUVASTATIN CALCIUM 5 MG PO TABS
5.0000 mg | ORAL_TABLET | Freq: Every day | ORAL | Status: DC
  Administered 2024-04-24 – 2024-04-30 (×7): 5 mg via ORAL
  Filled 2024-04-23 (×7): qty 1

## 2024-04-23 MED ORDER — METHOCARBAMOL 500 MG PO TABS
500.0000 mg | ORAL_TABLET | Freq: Four times a day (QID) | ORAL | Status: DC | PRN
  Administered 2024-04-25 – 2024-04-30 (×5): 500 mg via ORAL
  Filled 2024-04-23 (×5): qty 1

## 2024-04-23 MED ORDER — ACETAMINOPHEN 650 MG RE SUPP: 650.0000 mg | RECTAL | Status: DC | PRN

## 2024-04-23 MED ORDER — GABAPENTIN 400 MG PO CAPS: 400.0000 mg | ORAL_CAPSULE | Freq: Three times a day (TID) | ORAL | Status: DC

## 2024-04-23 MED ORDER — ONDANSETRON HCL 4 MG/2ML IJ SOLN: 4.0000 mg | Freq: Four times a day (QID) | INTRAMUSCULAR | Status: DC | PRN

## 2024-04-23 MED ORDER — SERTRALINE HCL 100 MG PO TABS
100.0000 mg | ORAL_TABLET | Freq: Every day | ORAL | Status: DC
  Administered 2024-04-24 – 2024-04-30 (×7): 100 mg via ORAL
  Filled 2024-04-23 (×7): qty 1

## 2024-04-23 MED ORDER — OXYCODONE HCL 5 MG PO TABS
5.0000 mg | ORAL_TABLET | ORAL | Status: DC | PRN
  Administered 2024-04-25: 5 mg via ORAL
  Filled 2024-04-23: qty 1

## 2024-04-23 MED ORDER — ALBUTEROL SULFATE (2.5 MG/3ML) 0.083% IN NEBU: 2.5000 mg | INHALATION_SOLUTION | Freq: Four times a day (QID) | RESPIRATORY_TRACT | Status: DC | PRN

## 2024-04-23 MED ORDER — ENOXAPARIN SODIUM 40 MG/0.4ML IJ SOSY: 40.0000 mg | PREFILLED_SYRINGE | INTRAMUSCULAR | Status: DC

## 2024-04-23 MED ORDER — ACETAMINOPHEN 325 MG PO TABS
650.0000 mg | ORAL_TABLET | ORAL | Status: DC | PRN
  Administered 2024-04-26 (×2): 650 mg via ORAL
  Filled 2024-04-23 (×4): qty 2

## 2024-04-23 MED ORDER — METHOCARBAMOL 1000 MG/10ML IJ SOLN: 500.0000 mg | Freq: Four times a day (QID) | INTRAMUSCULAR | Status: DC | PRN

## 2024-04-23 NOTE — Discharge Summary (Signed)
 Physician Discharge Summary  Patient ID: MENA LIENAU MRN: [id number] DOB/AGE: [birth year] 58 y.o.  Admit date: 04/23/2024 Discharge date: 04/30/2024  Discharge Diagnoses:  Principal Problem:   Lumbar radiculopathy DVT prophylaxis Hypertension Acute blood loss anemia Hyperlipidemia COPD/tobacco use History of alcohol use GERD Remote seizure history Depression  Discharged Condition: Stable  Significant Diagnostic Studies: VAS US  LOWER EXTREMITY VENOUS (DVT) Result Date: 04/24/2024  Lower Venous DVT Study Patient Name:  Bradley Fisher  Date of Exam:   04/24/2024 Medical Rec #: [id number]         Accession #:    [id number] Date of Birth: Aug 22, 1966        Patient Gender: M Patient Age:   58 years Exam Location:  [facility] Procedure:      VAS US  LOWER EXTREMITY VENOUS (DVT) Referring Phys: Georjean Kite --------------------------------------------------------------------------------  Indications: Swelling.  Risk Factors: None identified. Comparison Study: No prior studies. Performing Technologist: Lerry Ransom RVT  Examination Guidelines: A complete evaluation includes B-mode imaging, spectral Doppler, color Doppler, and power Doppler as needed of all accessible portions of each vessel. Bilateral testing is considered an integral part of a complete examination. Limited examinations for reoccurring indications may be performed as noted. The reflux portion of the exam is performed with the patient in reverse Trendelenburg.  +---------+---------------+---------+-----------+----------+--------------+ RIGHT    CompressibilityPhasicitySpontaneityPropertiesThrombus Aging +---------+---------------+---------+-----------+----------+--------------+ CFV      Full           Yes      Yes                                 +---------+---------------+---------+-----------+----------+--------------+ SFJ      Full                                                         +---------+---------------+---------+-----------+----------+--------------+ FV Prox  Full                                                        +---------+---------------+---------+-----------+----------+--------------+ FV Mid   Full                                                        +---------+---------------+---------+-----------+----------+--------------+ FV DistalFull                                                        +---------+---------------+---------+-----------+----------+--------------+ PFV      Full                                                        +---------+---------------+---------+-----------+----------+--------------+ POP  Full           Yes      Yes                                 +---------+---------------+---------+-----------+----------+--------------+ PTV      Full                                                        +---------+---------------+---------+-----------+----------+--------------+ PERO     Full                                                        +---------+---------------+---------+-----------+----------+--------------+   +---------+---------------+---------+-----------+----------+--------------+ LEFT     CompressibilityPhasicitySpontaneityPropertiesThrombus Aging +---------+---------------+---------+-----------+----------+--------------+ CFV      Full           Yes      Yes                                 +---------+---------------+---------+-----------+----------+--------------+ SFJ      Full                                                        +---------+---------------+---------+-----------+----------+--------------+ FV Prox  Full                                                        +---------+---------------+---------+-----------+----------+--------------+ FV Mid   Full                                                         +---------+---------------+---------+-----------+----------+--------------+ FV DistalFull                                                        +---------+---------------+---------+-----------+----------+--------------+ PFV      Full                                                        +---------+---------------+---------+-----------+----------+--------------+ POP      Full           Yes      Yes                                 +---------+---------------+---------+-----------+----------+--------------+  PTV      Full                                                        +---------+---------------+---------+-----------+----------+--------------+ PERO     Full                                                        +---------+---------------+---------+-----------+----------+--------------+     Summary: RIGHT: - There is no evidence of deep vein thrombosis in the lower extremity.  - No cystic structure found in the popliteal fossa.  LEFT: - There is no evidence of deep vein thrombosis in the lower extremity.  - No cystic structure found in the popliteal fossa.  *See table(s) above for measurements and observations. Electronically signed by Delaney Fearing on 04/24/2024 at 4:23:45 PM.    Final    DG Lumbar Spine 2-3 Views Result Date: 04/13/2024 CLINICAL DATA:  Elective surgery. EXAM: LUMBAR SPINE - 2-3 VIEW COMPARISON:  CT yesterday. FINDINGS: Five fluoroscopic spot views of the lumbar spine submitted from the operating room. Posterior rod with pedicle screw fixation from T10 through the sacrum. Lateral fusion and interbody spacers at multiple levels, present previously. Fluoroscopy time 1 minutes 4 seconds. Dose 31.23 mGy. IMPRESSION: Intraoperative fluoroscopy during lumbar spine surgery. Electronically Signed   By: Chadwick Colonel M.D.   On: 04/13/2024 17:18   DG C-Arm 1-60 Min-No Report Result Date: 04/13/2024 Fluoroscopy was utilized by the requesting physician.  No  radiographic interpretation.   DG C-Arm 1-60 Min-No Report Result Date: 04/13/2024 Fluoroscopy was utilized by the requesting physician.  No radiographic interpretation.   DG C-Arm 1-60 Min-No Report Result Date: 04/13/2024 Fluoroscopy was utilized by the requesting physician.  No radiographic interpretation.   DG C-Arm 1-60 Min-No Report Result Date: 04/13/2024 Fluoroscopy was utilized by the requesting physician.  No radiographic interpretation.   DG C-Arm 1-60 Min-No Report Result Date: 04/13/2024 Fluoroscopy was utilized by the requesting physician.  No radiographic interpretation.   CT PELVIS WO CONTRAST Result Date: 04/12/2024 CLINICAL DATA:  Preoperative planning. Recent multilevel lumbar fusion. EXAM: CT PELVIS WITHOUT CONTRAST TECHNIQUE: Multidetector CT imaging of the pelvis was performed following the standard protocol without intravenous contrast. RADIATION DOSE REDUCTION: This exam was performed according to the departmental dose-optimization program which includes automated exposure control, adjustment of the mA and/or kV according to patient size and/or use of iterative reconstruction technique. COMPARISON:  Intraoperative radiographs 04/11/2024. CT lumbar spine 01/31/2024. FINDINGS: Thoracic and lumbar spine findings are dictated separately. Urinary Tract: The distal ureters appear normal in caliber. There is a small amount of air in the bladder lumen, likely iatrogenic. No bladder wall thickening. Bowel: No bowel wall thickening, distention or surrounding inflammation identified within the pelvis. Vascular/Lymphatic: No enlarged pelvic lymph nodes identified. Mild aortoiliac atherosclerosis. Reproductive: The prostate gland and seminal vesicles appear unremarkable. Other: As described on separate examination of the thoracolumbar spine, there are postsurgical changes related to the recent thoracolumbar fusion. There is retroperitoneal and intraperitoneal air and fluid without focal hematoma  or other organized collection. Gas extends into the right lateral abdominal wall. Musculoskeletal: Mild sacroiliac degenerative changes bilaterally. No evidence of  pelvic fracture or diastasis of the sacroiliac joints or symphysis pubis. The hip joint spaces are preserved. There is a small amount of gas and fluid within the psoas muscles bilaterally attributed to the recent surgery. The pelvic musculature appears unremarkable. IMPRESSION: 1. Pelvic imaging for preoperative planning. 2. Postsurgical changes related to recent thoracolumbar fusion. 3. No unexpected or acute findings identified in the pelvis. 4. Thoracic and lumbar spine findings are dictated separately. Electronically Signed   By: Elmon Hagedorn M.D.   On: 04/12/2024 16:52   CT LUMBAR SPINE WO CONTRAST Result Date: 04/12/2024 CLINICAL DATA:  Preoperative planning. Patient underwent arthrodesis with anterior interbody technique at L1-2, L3-4, L4-5 and L5-S1 yesterday. EXAM: CT THORACIC AND LUMBAR SPINE WITHOUT CONTRAST TECHNIQUE: Multidetector CT imaging of the thoracic and lumbar spine was performed without contrast. Multiplanar CT image reconstructions were also generated. RADIATION DOSE REDUCTION: This exam was performed according to the departmental dose-optimization program which includes automated exposure control, adjustment of the mA and/or kV according to patient size and/or use of iterative reconstruction technique. COMPARISON:  CT thoracic and lumbar spine 01/31/2024. Intraoperative images [id number]. FINDINGS: CT THORACIC SPINE FINDINGS Alignment: The thoracic spine images extend inferiorly from T4. Full examination of the upper thoracic spine was specifically not requested. The alignment of the thoracic spine is normal aside from mild straightening. No focal angulation or listhesis. Vertebrae: No evidence of acute fracture, traumatic subluxation or aggressive osseous lesion. Paraspinal and other soft tissues: The thoracic paraspinal soft  tissues appear unremarkable. Mildly increased dependent atelectasis in both lungs. No evidence of pneumothorax or significant pleural effusion. Disc levels: Again demonstrated is moderate to severe left foraminal narrowing at T10-11 secondary to asymmetric disc bulging, endplate osteophytes and facet hypertrophy. There is asymmetric disc bulging and facet hypertrophy on the left at T12-L1, with moderate to severe left foraminal narrowing. The thoracic spine findings appear unchanged from the previous CT. CT LUMBAR SPINE FINDINGS Segmentation: There are 5 lumbar type vertebral bodies. Alignment: Similar convex left scoliosis centered at L2-3. No focal angulation or significant listhesis. Vertebrae: Since the previous CT, the patient has undergone interval anterior interbody fusion at L4-5 and L5-S1 and right lateral interbody fusion at L1-2 and L3-4. The hardware appears well positioned and intact. There is no evidence of acute fracture. Stable interbody ankylosis asymmetric to the right at L2-3. No evidence of discitis or osteomyelitis. Paraspinal and other soft tissues: Postsurgical changes related to the recent multilevel fusion. There is retroperitoneal gas, asymmetric to the right. There is a small amount of pneumoperitoneum. In addition, there is low-density retroperitoneal fluid, asymmetric to the left. No organized fluid collection or hematoma identified. Disc levels: L1-2: Interval interbody fusion with improved patency of the spinal canal and right foramen. L2-3: Stable interbody ankylosis asymmetric to the right with endplate osteophytes and bilateral facet hypertrophy. Previous right-sided laminectomy. Unchanged chronic moderate right and mild left foraminal narrowing. L3-4: Interval interbody fusion with mildly improved patency of the right foramen. Stable mild spinal stenosis. L4-5: Interval anterior interbody fusion with mildly improved patency of the spinal canal and neural foramina. L5-S1: Interval  anterior interbody fusion with epidural air on the left extending into the left facet joint. No spinal stenosis. Improved left foraminal patency. IMPRESSION: 1. Interval anterior interbody fusion at L4-5 and L5-S1 and right lateral interbody fusion at L1-2 and L3-4 with improved patency of the spinal canal and neural foramina at these levels. 2. Postsurgical changes in the retroperitoneum and abdomen as described. No organized fluid collection  or hematoma identified. 3. No evidence of acute fracture or traumatic subluxation of the thoracic or lumbar spine. 4. Unchanged spondylosis with moderate to severe left foraminal narrowing at T10-11 and T12-L1. Electronically Signed   By: Elmon Hagedorn M.D.   On: 04/12/2024 16:45   CT THORACIC SPINE WO CONTRAST Result Date: 04/12/2024 CLINICAL DATA:  Preoperative planning. Patient underwent arthrodesis with anterior interbody technique at L1-2, L3-4, L4-5 and L5-S1 yesterday. EXAM: CT THORACIC AND LUMBAR SPINE WITHOUT CONTRAST TECHNIQUE: Multidetector CT imaging of the thoracic and lumbar spine was performed without contrast. Multiplanar CT image reconstructions were also generated. RADIATION DOSE REDUCTION: This exam was performed according to the departmental dose-optimization program which includes automated exposure control, adjustment of the mA and/or kV according to patient size and/or use of iterative reconstruction technique. COMPARISON:  CT thoracic and lumbar spine 01/31/2024. Intraoperative images [id number]. FINDINGS: CT THORACIC SPINE FINDINGS Alignment: The thoracic spine images extend inferiorly from T4. Full examination of the upper thoracic spine was specifically not requested. The alignment of the thoracic spine is normal aside from mild straightening. No focal angulation or listhesis. Vertebrae: No evidence of acute fracture, traumatic subluxation or aggressive osseous lesion. Paraspinal and other soft tissues: The thoracic paraspinal soft tissues appear  unremarkable. Mildly increased dependent atelectasis in both lungs. No evidence of pneumothorax or significant pleural effusion. Disc levels: Again demonstrated is moderate to severe left foraminal narrowing at T10-11 secondary to asymmetric disc bulging, endplate osteophytes and facet hypertrophy. There is asymmetric disc bulging and facet hypertrophy on the left at T12-L1, with moderate to severe left foraminal narrowing. The thoracic spine findings appear unchanged from the previous CT. CT LUMBAR SPINE FINDINGS Segmentation: There are 5 lumbar type vertebral bodies. Alignment: Similar convex left scoliosis centered at L2-3. No focal angulation or significant listhesis. Vertebrae: Since the previous CT, the patient has undergone interval anterior interbody fusion at L4-5 and L5-S1 and right lateral interbody fusion at L1-2 and L3-4. The hardware appears well positioned and intact. There is no evidence of acute fracture. Stable interbody ankylosis asymmetric to the right at L2-3. No evidence of discitis or osteomyelitis. Paraspinal and other soft tissues: Postsurgical changes related to the recent multilevel fusion. There is retroperitoneal gas, asymmetric to the right. There is a small amount of pneumoperitoneum. In addition, there is low-density retroperitoneal fluid, asymmetric to the left. No organized fluid collection or hematoma identified. Disc levels: L1-2: Interval interbody fusion with improved patency of the spinal canal and right foramen. L2-3: Stable interbody ankylosis asymmetric to the right with endplate osteophytes and bilateral facet hypertrophy. Previous right-sided laminectomy. Unchanged chronic moderate right and mild left foraminal narrowing. L3-4: Interval interbody fusion with mildly improved patency of the right foramen. Stable mild spinal stenosis. L4-5: Interval anterior interbody fusion with mildly improved patency of the spinal canal and neural foramina. L5-S1: Interval anterior  interbody fusion with epidural air on the left extending into the left facet joint. No spinal stenosis. Improved left foraminal patency. IMPRESSION: 1. Interval anterior interbody fusion at L4-5 and L5-S1 and right lateral interbody fusion at L1-2 and L3-4 with improved patency of the spinal canal and neural foramina at these levels. 2. Postsurgical changes in the retroperitoneum and abdomen as described. No organized fluid collection or hematoma identified. 3. No evidence of acute fracture or traumatic subluxation of the thoracic or lumbar spine. 4. Unchanged spondylosis with moderate to severe left foraminal narrowing at T10-11 and T12-L1. Electronically Signed   By: Muriel Arm.D.  On: 04/12/2024 16:45   DG Lumbar Spine 2-3 Views Result Date: 04/11/2024 CLINICAL DATA:  Surgical posterior fusion extending from T10 to pelvis. EXAM: LUMBAR SPINE - 2-3 VIEW; DG C-ARM 1-60 MIN-NO REPORT Radiation exposure index: 150.1 mGy. COMPARISON:  CT scan of January 31, 2024. FINDINGS: Sixteen fluoroscopic images were obtained of the lumbar spine. These demonstrate interbody spacers at L4-5 and L5-S1. Also noted was lateral fusion of L1-2 and L3-4. IMPRESSION: Fluoroscopic guidance provided during surgical fusion at multiple levels of the lumbar spine. Electronically Signed   By: Rosalene Colon M.D.   On: 04/11/2024 16:03   DG C-Arm 1-60 Min-No Report Result Date: 04/11/2024 CLINICAL DATA:  Surgical posterior fusion extending from T10 to pelvis. EXAM: LUMBAR SPINE - 2-3 VIEW; DG C-ARM 1-60 MIN-NO REPORT Radiation exposure index: 150.1 mGy. COMPARISON:  CT scan of January 31, 2024. FINDINGS: Sixteen fluoroscopic images were obtained of the lumbar spine. These demonstrate interbody spacers at L4-5 and L5-S1. Also noted was lateral fusion of L1-2 and L3-4. IMPRESSION: Fluoroscopic guidance provided during surgical fusion at multiple levels of the lumbar spine. Electronically Signed   By: Rosalene Colon M.D.   On:  04/11/2024 16:03   DG C-Arm 1-60 Min-No Report Result Date: 04/11/2024 CLINICAL DATA:  Surgical posterior fusion extending from T10 to pelvis. EXAM: LUMBAR SPINE - 2-3 VIEW; DG C-ARM 1-60 MIN-NO REPORT Radiation exposure index: 150.1 mGy. COMPARISON:  CT scan of January 31, 2024. FINDINGS: Sixteen fluoroscopic images were obtained of the lumbar spine. These demonstrate interbody spacers at L4-5 and L5-S1. Also noted was lateral fusion of L1-2 and L3-4. IMPRESSION: Fluoroscopic guidance provided during surgical fusion at multiple levels of the lumbar spine. Electronically Signed   By: Rosalene Colon M.D.   On: 04/11/2024 16:03   DG C-Arm 1-60 Min-No Report Result Date: 04/11/2024 CLINICAL DATA:  Surgical posterior fusion extending from T10 to pelvis. EXAM: LUMBAR SPINE - 2-3 VIEW; DG C-ARM 1-60 MIN-NO REPORT Radiation exposure index: 150.1 mGy. COMPARISON:  CT scan of January 31, 2024. FINDINGS: Sixteen fluoroscopic images were obtained of the lumbar spine. These demonstrate interbody spacers at L4-5 and L5-S1. Also noted was lateral fusion of L1-2 and L3-4. IMPRESSION: Fluoroscopic guidance provided during surgical fusion at multiple levels of the lumbar spine. Electronically Signed   By: Rosalene Colon M.D.   On: 04/11/2024 16:03   DG C-Arm 1-60 Min-No Report Result Date: 04/11/2024 CLINICAL DATA:  Surgical posterior fusion extending from T10 to pelvis. EXAM: LUMBAR SPINE - 2-3 VIEW; DG C-ARM 1-60 MIN-NO REPORT Radiation exposure index: 150.1 mGy. COMPARISON:  CT scan of January 31, 2024. FINDINGS: Sixteen fluoroscopic images were obtained of the lumbar spine. These demonstrate interbody spacers at L4-5 and L5-S1. Also noted was lateral fusion of L1-2 and L3-4. IMPRESSION: Fluoroscopic guidance provided during surgical fusion at multiple levels of the lumbar spine. Electronically Signed   By: Rosalene Colon M.D.   On: 04/11/2024 16:03   DG C-Arm 1-60 Min-No Report Result Date: 04/11/2024 CLINICAL  DATA:  Surgical posterior fusion extending from T10 to pelvis. EXAM: LUMBAR SPINE - 2-3 VIEW; DG C-ARM 1-60 MIN-NO REPORT Radiation exposure index: 150.1 mGy. COMPARISON:  CT scan of January 31, 2024. FINDINGS: Sixteen fluoroscopic images were obtained of the lumbar spine. These demonstrate interbody spacers at L4-5 and L5-S1. Also noted was lateral fusion of L1-2 and L3-4. IMPRESSION: Fluoroscopic guidance provided during surgical fusion at multiple levels of the lumbar spine. Electronically Signed  By: Rosalene Colon M.D.   On: 04/11/2024 16:03   DG C-Arm 1-60 Min-No Report Result Date: 04/11/2024 CLINICAL DATA:  Surgical posterior fusion extending from T10 to pelvis. EXAM: LUMBAR SPINE - 2-3 VIEW; DG C-ARM 1-60 MIN-NO REPORT Radiation exposure index: 150.1 mGy. COMPARISON:  CT scan of January 31, 2024. FINDINGS: Sixteen fluoroscopic images were obtained of the lumbar spine. These demonstrate interbody spacers at L4-5 and L5-S1. Also noted was lateral fusion of L1-2 and L3-4. IMPRESSION: Fluoroscopic guidance provided during surgical fusion at multiple levels of the lumbar spine. Electronically Signed   By: Rosalene Colon M.D.   On: 04/11/2024 16:03   DG C-Arm 1-60 Min-No Report Result Date: 04/11/2024 CLINICAL DATA:  Surgical posterior fusion extending from T10 to pelvis. EXAM: LUMBAR SPINE - 2-3 VIEW; DG C-ARM 1-60 MIN-NO REPORT Radiation exposure index: 150.1 mGy. COMPARISON:  CT scan of January 31, 2024. FINDINGS: Sixteen fluoroscopic images were obtained of the lumbar spine. These demonstrate interbody spacers at L4-5 and L5-S1. Also noted was lateral fusion of L1-2 and L3-4. IMPRESSION: Fluoroscopic guidance provided during surgical fusion at multiple levels of the lumbar spine. Electronically Signed   By: Rosalene Colon M.D.   On: 04/11/2024 16:03   DG C-Arm 1-60 Min-No Report Result Date: 04/11/2024 CLINICAL DATA:  Surgical posterior fusion extending from T10 to pelvis. EXAM: LUMBAR SPINE -  2-3 VIEW; DG C-ARM 1-60 MIN-NO REPORT Radiation exposure index: 150.1 mGy. COMPARISON:  CT scan of January 31, 2024. FINDINGS: Sixteen fluoroscopic images were obtained of the lumbar spine. These demonstrate interbody spacers at L4-5 and L5-S1. Also noted was lateral fusion of L1-2 and L3-4. IMPRESSION: Fluoroscopic guidance provided during surgical fusion at multiple levels of the lumbar spine. Electronically Signed   By: Rosalene Colon M.D.   On: 04/11/2024 16:03   DG OR LOCAL ABDOMEN Result Date: 04/11/2024 CLINICAL DATA:  Rule out unintentional retained radiopaque foreign object. EXAM: OR LOCAL ABDOMEN COMPARISON:  None Available. FINDINGS: Lower lumbar fusion hardware noted. No other retained radiopaque foreign object. There is no acute fracture or dislocation. The soft tissues are unremarkable. IMPRESSION: 1. No retained radiopaque foreign object. 2. Lower lumbar fusion hardware. Electronically Signed   By: Angus Bark M.D.   On: 04/11/2024 12:06    Labs:  Basic Metabolic Panel: Recent Labs  Lab 04/23/24 1845 04/24/24 0532 04/25/24 0519 04/26/24 0543  NA  --  128* 128* 130*  K  --  4.4 4.4 4.2  CL  --  95* 94* 95*  CO2  --  25 23 26   GLUCOSE  --  108* 106* 101*  BUN  --  13 13 12   CREATININE 0.64 0.63 0.61 0.65  CALCIUM   --  8.9 8.9 9.3    CBC: Recent Labs  Lab 04/23/24 1845 04/24/24 0532  WBC 11.0* 9.2  NEUTROABS  --  7.0  HGB 10.0* 9.4*  HCT 29.2* 27.2*  MCV 86.9 85.8  PLT 519* 477*    CBG: No results for input(s): "GLUCAP" in the last 168 hours.  Family history.  Mother with hypertension and diabetes.  Denies any colon cancer or esophageal cancer or rectal cancer  Brief HPI:   Bradley Fisher is a 58 y.o. right-handed male with history significant for hypertension, depression, hyperlipidemia, COPD with tobacco use, GERD, alcohol use remote seizure disorder no longer on antiseizure medication and chronic back pain.  Per chart review patient lives with spouse.   Independent prior to admission.  Presented 04/11/2024 with progressive back pain radiating to the lower extremities.  X-rays and imaging revealed multilevel thoracolumbar degenerative disease with multilevel spinal stenosis/radiculopathy.  Patient underwent arthrodesis L5-S1 anterior interbody technique via anterior retroperitoneal approach as well as L4-5 retroperitoneal approach, L3-4 and L1-2 anterior interbody technique via right retroperitoneal Tran psoas approach with anterior instrumentation/fixation with integrated bone anchor placement L5-S1 and L4-5 04/11/2024 per Dr. Arvilla Birmingham and assistance of vascular surgery Dr. Jimmye Moulds followed by segmental instrumentation with pedicle and S2 A1 screw and rod construction at T10-11-12-L1-L2-L3-L4-L5-S1-S2 04/13/2024.  Back brace when out of bed applied in sitting position.  Acute blood loss anemia 7.8-9.1.  Patient was cleared to begin Lovenox  for DVT prophylaxis.  Therapy evaluations completed due to patient decreased functional mobility was admitted for a comprehensive rehab program.   Hospital Course: Suleman Gunning Grosshans was admitted to rehab 04/23/2024 for inpatient therapies to consist of PT, ST and OT at least three hours five days a week. Past admission physiatrist, therapy team and rehab RN have worked together to provide customized collaborative inpatient rehab.  Pertaining to patient's lumbar spondylosis/stenosis/radiculopathy status post multilevel L4-5 L5-S1 ALIF, L1-2, L3-4 DLIF, T10 to ilium posterior instrumentation and fusion 04/11/2024 and second stage V 02/02/2024.  Patient would follow-up neurosurgery.  Back incision clean and dry.  Back brace when out of bed applied in sitting position.  Lovenox  for DVT prophylaxis venous Doppler studies negative.  Neurontin  ongoing for pain management titrated as needed with Robaxin  and hydrocodone  as needed.  Premorbidly patient was on chronic hydrocodone .  Mood stabilization on Zoloft  prior to  admission.  Patient was attending full therapies.  Emotional support provided.  Acute blood loss anemia stable no bleeding episodes.  Blood pressure controlled on Norvasc  and Avapro  and would need outpatient follow-up with monitoring with increased mobility.  Crestor  for hyperlipidemia.  COPD/tobacco use provided counsel regards to cessation of nicotine products.  Oxygen saturations maintained greater than 90%.  History of alcohol use exhibited no signs of withdrawal again providing counseling regards to cessation of alcohol.  Protonix  ongoing for GERD.  There was question of medical noncompliance and again patient did receive counts regards to maintaining medical regimen as well as routine follow-ups with PCP.   Blood pressures were monitored on TID basis and remained controlled and monitored      Rehab course: During patient's stay in rehab weekly team conferences were held to monitor patient's progress, set goals and discuss barriers to discharge. At admission, patient required minimal assist 15 feet rolling walker minimal assist step pivot transfers  Physical exam.  Blood pressure 117/73 pulse 72 temperature 97.7 respirations 18 oxygen saturation is 93% room air Constitutional.  No acute distress HEENT Head.  Normocephalic and atraumatic Eyes.  Pupils round and reactive to light no discharge without nystagmus Neck.  Supple nontender no JVD without thyromegaly Cardiac regular rate and rhythm without any extra sounds or murmur heard Abdomen.  Soft nontender positive bowel sounds without rebound Respiratory effort normal no respiratory distress without wheeze Extremities.  No clubbing cyanosis or edema Neurologic.  Alert and oriented x 3.  Cranial nerves II through XII intact.  Motor strength 5/5 bilateral deltoid bicep tricep grip left hip flexor knee extensors ankle dorsi plantarflexion. Lower extremities 2 - hip flexion 3 - knee extension 4/5 ankle dorsi plantarflexion Sensation exam  reduced sensation right L3 right L4 dermatomal distribution  He/She  has had improvement in activity tolerance, balance, postural control as well as ability to compensate for  deficits. He/She has had improvement in functional use RUE/LUE  and RLE/LLE as well as improvement in awareness.  Contact-guard step pivot transfers as well as contact-guard side-lying to sitting and sit to side-lying..  Ambulating 110 feet to the Ortho gym with supervision.  Ambulates back to his room in the same manner with light contact-guard.  Supervision level for ADLs.  Full family teaching completed plan discharge to home       Disposition: Discharge disposition: 06-Home-Health Care Svc        Diet: Regular  Special Instructions: No driving smoking or alcohol  Back brace when out of bed  Medications at discharge. 1.  Tylenol  as needed 2.  Azor  10/20 mg daily 3.  Colace 100 mg p.o. twice daily prn 4.  Neurontin  400 mg p.o. 3 times daily 5.  Zyrtec  10 mg daily 6.  Robaxin  500 mg every 6 hours as needed muscle spasms 7.  Nexium  40 mg daily 8.  Crestor  5 mg p.o. daily 9.  Zoloft  100 mg p.o. daily 10.  Hydrocodone  10/325 mg 1 tablet every 4 hours as needed pain 11.  Flonase  2 sprays into both nostrils daily 12.  ProAir  HFA 108 mcg 2 puffs into the lungs every 6 hours as needed 13.Miralax  daily Hold for lose stol 30-35 minutes were spent completing discharge summary and discharge planning     Follow-up Information     Lovorn, Jacqlyn Matas, MD Follow up.   Specialty: Physical Medicine and Rehabilitation Why: No formal follow up needed Contact information: [address] [place] [address] [phone]         Van Gelinas, MD Follow up.   Specialty: Neurosurgery Why: Call for appointment Contact information: [address] [place] [address] [phone]                 Signed: Sterling Eisenmenger 04/30/2024, 5:02 AM

## 2024-04-23 NOTE — Progress Notes (Signed)
 Physical Therapy Treatment Patient Details Name: Bradley Fisher MRN: [id number] DOB: 1966-06-06 Today's Date: 04/23/2024   History of Present Illness Pt is a 58 yo male presenting to [facility] on 04/11/24 with chronic low back pain, working dx of lumbar radiculopathy. He underwent L1-S1 ALIF 4/30 and T10-S1 PLIF 5/2. PMH of DDD, HTN, seizures, OA, COPD, GERD, HLD.    PT Comments  Pt with fair tolerance to treatment today. Pt today was able to transfer to and from [facility] with RW CGA/Min A however further ambulation was deferred due to dizziness. BP: 139/71 supine, BP: 107/94 seated. Unable to obtain accurate standing BP despite 3 attempts and pt moving arm constantly. No change in DC/DME recs at this time. Pt anticipates being DC to [facility] today.    If plan is discharge home, recommend the following: Assistance with cooking/housework;Assist for transportation;Help with stairs or ramp for entrance;A lot of help with walking and/or transfers   Can travel by private vehicle     No  Equipment Recommendations  Hospital bed;Rolling walker (2 wheels);BSC/3in1    Recommendations for Other Services       Precautions / Restrictions Precautions Precautions: Back Precaution Booklet Issued: No Recall of Precautions/Restrictions: Impaired Precaution/Restrictions Comments: pt able to report 1/3 precautions, reviewed verbally Required Braces or Orthoses: Spinal Brace Spinal Brace: Lumbar corset;Applied in sitting position Restrictions Weight Bearing Restrictions Per Provider Order: No     Mobility  Bed Mobility Overal bed mobility: Needs Assistance Bed Mobility: Rolling, Sidelying to Sit, Sit to Sidelying Rolling: Supervision Sidelying to sit: Contact guard assist     Sit to sidelying: Contact guard assist General bed mobility comments: Able to perform log roll with CGA.    Transfers Overall transfer level: Needs assistance Equipment used: Rolling walker (2 wheels) Transfers: Sit to/from Stand,  Bed to chair/wheelchair/BSC Sit to Stand: Contact guard assist   Step pivot transfers: Contact guard assist, Min assist       General transfer comment: pt having difficulty progressing right leg/foot during transfers, min assist to stabilize RW and support trunk    Ambulation/Gait               General Gait Details: Deferred due to dizziness on BSC.   Stairs             Wheelchair Mobility     Tilt Bed    Modified Rankin (Stroke Patients Only)       Balance Overall balance assessment: Needs assistance Sitting-balance support: Feet supported, No upper extremity supported Sitting balance-Leahy Scale: Good     Standing balance support: Bilateral upper extremity supported Standing balance-Leahy Scale: Poor Standing balance comment: Reliant on both hands                            Communication Communication Communication: Impaired Factors Affecting Communication: Reduced clarity of speech (no teeth)  Cognition Arousal: Alert Behavior During Therapy: WFL for tasks assessed/performed   PT - Cognitive impairments: No family/caregiver present to determine baseline, Awareness, Safety/Judgement, Attention, Problem solving                       PT - Cognition Comments: Continued poor safety awareness. Following commands: Intact Following commands impaired: Follows one step commands with increased time    Cueing Cueing Techniques: Verbal cues, Gestural cues, Tactile cues  Exercises      General Comments General comments (skin integrity, edema, etc.): BP: 139/71 supine, BP:  107/94 seated. Unable to obtain accurate standing BP despite 3 attempts and pt moving arm constantly.      Pertinent Vitals/Pain Pain Assessment Pain Assessment: Faces Faces Pain Scale: No hurt    Home Living                          Prior Function            PT Goals (current goals can now be found in the care plan section) Progress towards PT  goals: Progressing toward goals    Frequency    Min 5X/week      PT Plan      Co-evaluation              AM-PAC PT "6 Clicks" Mobility   Outcome Measure  Help needed turning from your back to your side while in a flat bed without using bedrails?: A Little Help needed moving from lying on your back to sitting on the side of a flat bed without using bedrails?: A Little Help needed moving to and from a bed to a chair (including a wheelchair)?: A Little Help needed standing up from a chair using your arms (e.g., wheelchair or bedside chair)?: A Little Help needed to walk in hospital room?: Total Help needed climbing 3-5 steps with a railing? : Total 6 Click Score: 14    End of Session Equipment Utilized During Treatment: Back brace Activity Tolerance: Patient tolerated treatment well;Other (comment) (Limited by dizziness) Patient left: in bed;with call bell/phone within reach;with bed alarm set Nurse Communication: Mobility status PT Visit Diagnosis: Other abnormalities of gait and mobility (R26.89)     Time: 8657-8469 PT Time Calculation (min) (ACUTE ONLY): 19 min  Charges:    $Therapeutic Activity: 8-22 mins PT General Charges $$ ACUTE PT VISIT: 1 Visit                     Bradley Fisher, PT, DPT Acute Rehab Services [phone]    Bradley Fisher 04/23/2024, 4:14 PM

## 2024-04-23 NOTE — TOC Transition Note (Signed)
 Transition of Care [facility]) - Discharge Note   Patient Details  Name: Bradley Fisher MRN: [id number] Date of Birth: [birth year]  Transition of Care [facility]) CM/SW Contact:  Jonathan Neighbor, RN Phone Number: 04/23/2024, 12:21 PM   Clinical Narrative:     Pt is discharging to CIR today. TOC signing off.   Final next level of care: IP Rehab Facility Barriers to Discharge: No Barriers Identified   Patient Goals and CMS Choice Patient states their goals for this hospitalization and ongoing recovery are:: Return home CMS Medicare.gov Compare Post Acute Care list provided to:: Patient Represenative (must comment) Choice offered to / list presented to : Spouse [place] ownership interest in [facility].provided to:: Patient    Discharge Placement                       Discharge Plan and Services Additional resources added to the After Visit Summary for   In-house Referral: Clinical Social Work Discharge Planning Services: CM Consult Post Acute Care Choice: Home Health, Durable Medical Equipment          DME Arranged: Bedside commode, Wheelchair manual, Other see comment (hoyer lift) DME Agency: [employer] Date DME Agency Contacted: 04/16/24   Representative spoke with at DME Agency: [facility] HH Arranged: PT, OT, Nurse's Aide HH Agency: [facility] Date [facility] Agency Contacted: 04/16/24   Representative spoke with at [facility] Agency: Randel Buss  Social Drivers of Health (SDOH) Interventions SDOH Screenings   Food Insecurity: No Food Insecurity (04/12/2024)  Housing: Low Risk  (04/12/2024)  Transportation Needs: No Transportation Needs (04/12/2024)  Utilities: Not At Risk (04/12/2024)  Depression (PHQ2-9): Low Risk  (05/20/2021)  Financial Resource Strain: Low Risk  (02/12/2019)  Physical Activity: Inactive (02/12/2019)  Social Connections: Moderately Integrated (02/12/2019)  Stress: No Stress Concern Present (02/12/2019)  Tobacco Use: High Risk (04/13/2024)     Readmission Risk  Interventions     No data to display

## 2024-04-23 NOTE — Discharge Instructions (Signed)
 Inpatient Rehab Discharge Instructions  Bradley Fisher [facility] Discharge date and time: No discharge date for patient encounter.   Activities/Precautions/ Functional Status: Activity: Back brace when out of bed Diet: Regular Wound Care: Routine skin checks Functional status:  ___ No restrictions     ___ Walk up steps independently ___ 24/7 supervision/assistance   ___ Walk up steps with assistance ___ Intermittent supervision/assistance  ___ Bathe/dress independently ___ Walk with walker     _x__ Bathe/dress with assistance ___ Walk Independently    ___ Shower independently ___ Walk with assistance    ___ Shower with assistance ___ No alcohol     ___ Return to work/school ________  Special Instructions: No driving smoking or alcohol  COMMUNITY REFERRALS UPON DISCHARGE:    Home Health:   PT  &    OT                Agency:[facility] [phone]   Medical Equipment/Items Ordered:ROLLING WALKER AND 3 IN 1                                                 Agency/Supplier:[facility]   [phone]     My questions have been answered and I understand these instructions. I will adhere to these goals and the provided educational materials after my discharge from the hospital.  Patient/Caregiver Signature _______________________________ Date __________  Clinician Signature _______________________________________ Date __________  Please bring this form and your medication list with you to all your follow-up doctor's appointments.

## 2024-04-23 NOTE — Discharge Summary (Signed)
  Patient ID: [name] MRN: [id number] DOB/AGE: [birth year] 58 y.o.  Admit date: 04/11/2024 Discharge date: 04/23/2024  Admission Diagnoses: Other form of scoliosis of thoracolumbar spine [M41.85] Lumbar radiculopathy [M54.16]   Discharge Diagnoses: Same   Discharged Condition: Stable  Hospital Course:  Bradley Fisher is a 58 y.o. male who was admitted following a staged T10-pelvis anterolateral fusion. They were recovered in PACU after stage I, and stage II and transferred to the floor. Hospital course was uncomplicated. Pt stable for discharge today to CIR. Pt to f/u in office for routine post op visit. Pt is in agreement w/ plan.    Discharge Exam: Blood pressure 130/78, pulse 79, temperature 97.7 F (36.5 C), temperature source Oral, resp. rate 18, height 6' (1.829 m), weight 93 kg, SpO2 98%.   Disposition:   Discharge Instructions     Incentive spirometry RT   Complete by: As directed    Incentive spirometry RT   Complete by: As directed          Follow-up Information     [facility] Follow up.   Specialty: Home Health Services Why: [name] will contact you for the first home visit Contact information: [address] [place] [address] [phone]                 Signed: Jasia Hiltunen CAYLIN [name] 04/23/2024, 11:48 AM

## 2024-04-23 NOTE — Progress Notes (Signed)
 Inpatient Rehab Admissions Coordinator:   I have a bed for this patient to admit to CIR today.  Will notify medical team and pt/family.  TOC aware.  I will make arrangements.   Loye Rumble, PT, DPT Admissions Coordinator [phone] 04/23/24  11:07 AM

## 2024-04-23 NOTE — Plan of Care (Signed)
  Problem: Consults Goal: RH SPINAL CORD INJURY PATIENT EDUCATION Description:  See Patient Education module for education specifics.  Outcome: Progressing   Problem: SCI BOWEL ELIMINATION Goal: RH STG MANAGE BOWEL WITH ASSISTANCE Description: STG Manage Bowel with supervision Assistance. Outcome: Progressing   Problem: SCI BLADDER ELIMINATION Goal: RH STG MANAGE BLADDER WITH ASSISTANCE Description: STG Manage Bladder With  supervision Assistance Outcome: Progressing   Problem: RH SKIN INTEGRITY Goal: RH STG SKIN FREE OF INFECTION/BREAKDOWN Description: Manage skin free of infection/breakdown with supervision Outcome: Progressing   Problem: RH SAFETY Goal: RH STG ADHERE TO SAFETY PRECAUTIONS W/ASSISTANCE/DEVICE Description: STG Adhere to Safety Precautions With supervision Assistance/Device. Outcome: Progressing   Problem: RH PAIN MANAGEMENT Goal: RH STG PAIN MANAGED AT OR BELOW PT'S PAIN GOAL Description: <4 w/ prns Outcome: Progressing   Problem: RH KNOWLEDGE DEFICIT SCI Goal: RH STG INCREASE KNOWLEDGE OF SELF CARE AFTER SCI Description: Manage increase knowledge of self care after sci with supervision from spouse using educational materials provided Outcome: Progressing

## 2024-04-23 NOTE — Progress Notes (Signed)
    Providing Compassionate, Quality Care - Together   NEUROSURGERY PROGRESS NOTE     S: No issues overnight.    O: EXAM:  BP 130/78 (BP Location: Left Arm)   Pulse 79   Temp 97.7 F (36.5 C) (Oral)   Resp 18   Ht 6' (1.829 m)   Wt 93 kg   SpO2 98%   BMI 27.80 kg/m     Awake, alert, oriented  Speech fluent, appropriate  BUE/BLE 5/5 SILTx4   ASSESSMENT:  58 y.o. s/p T10-pelvis fusion    PLAN: -Awaiting CIR admit -Cont supporitve care -Cont therapies as tolerated.  -Call w/ questions/concerns.   Bradley Fisher, [facility]

## 2024-04-23 NOTE — Progress Notes (Signed)
 Patient discharged to [facility] room 4 [place] 04, report called, patient transferred with all belongings, wife updated by the patient.

## 2024-04-23 NOTE — H&P (Signed)
 Physical Medicine and Rehabilitation Admission H&P       HPI: Bradley Fisher is a 58 year old right-handed male with history significant for hypertension, depression, hyperlipidemia, COPD/tobacco use, GERD, alcohol use, remote seizure disorder no longer on antiseizure medication and chronic back pain.  Per chart review patient lives with spouse.  1 level home 3 steps to enter.  Independent prior to admission.  Presented 04/11/2024 with progressive back pain radiating to the lower extremities.  X-rays and imaging revealed multilevel thoracolumbar degenerative disease with multilevel spinal stenosis/radiculopathy.  Patient underwent arthrodesis L5-S1 anterior interbody technique via anterior retroperitoneal approach as well as L4-5 retroperitoneal approach, L3-4 and L1-2 anterior interbody technique via right retroperitoneal Transpsoas approach with anterior instrumentation/fixation with integrated bone anchor placement L5-S1 and L4-5 04/11/2024 by Dr. Arvilla Birmingham and assistance from vascular Dr. Jimmye Moulds followed by segmental instrumentation with pedicle and S2 A1 screw and rod construction at T10-T11-12-L1-L2-L3-L4-L5-S1-S2 a 1 04/13/2024.  Back brace when out of bed applied in sitting position.  Acute blood loss anemia 7.8-9.1.  Patient was cleared to begin Lovenox  for DVT prophylaxis.  Therapy evaluations completed due to patient's decreased functional mobility was admitted for a comprehensive rehab program.   Review of Systems  Constitutional:  Negative for chills and fever.  HENT:  Negative for hearing loss.   Eyes:  Negative for blurred vision and double vision.  Respiratory:  Negative for cough, shortness of breath and wheezing.   Cardiovascular:  Negative for chest pain, palpitations and leg swelling.  Gastrointestinal:  Positive for constipation. Negative for heartburn, nausea and vomiting.       GERD  Genitourinary:  Positive for urgency. Negative for dysuria, flank pain and  hematuria.  Musculoskeletal:  Positive for back pain, joint pain and myalgias.  Skin:  Negative for rash.  Neurological:        Remote history of seizure  Psychiatric/Behavioral:  Positive for depression. The patient has insomnia.   All other systems reviewed and are negative.       Past Medical History:  Diagnosis Date   Arthritis     Asthma     COPD (chronic obstructive pulmonary disease) (HCC)     Depression     GERD (gastroesophageal reflux disease)     Hyperlipidemia     Hypertension     Seizures (HCC)      unknow etiology; on meds for this; no seizures since on meds 10 years ago             Past Surgical History:  Procedure Laterality Date   ABDOMINAL EXPOSURE N/A 04/11/2024    Procedure: ABDOMINAL EXPOSURE;  Surgeon: Young Hensen, MD;  Location: [facility] OR;  Service: Vascular;  Laterality: N/A;   ANTERIOR LAT LUMBAR FUSION Right 04/11/2024    Procedure: LUMBAR ONE-TWO, LUMBAR THREE-FOUR ANTERIOR LATERAL LUMBAR FUSION;  Surgeon: Van Gelinas, MD;  Location: [facility] OR;  Service: Neurosurgery;  Laterality: Right;  L12, L34 DLIF RT SIDE LATERAL DECUB   ANTERIOR LUMBAR FUSION Right 04/11/2024    Procedure: LUMBAR FOUR-FIVE, LUMBAR FIVE-SACRAL ONE ANTERIOR LUMBAR FUSION;  Surgeon: Van Gelinas, MD;  Location: [facility] OR;  Service: Neurosurgery;  Laterality: Right;  ALIF L45, L5S1   APPLICATION OF CELL SAVER N/A 04/13/2024    Procedure: APPLICATION OF CELL SAVER;  Surgeon: Van Gelinas, MD;  Location: [facility] OR;  Service: Neurosurgery;  Laterality: N/A;   APPLICATION OF ROBOTIC ASSISTANCE FOR SPINAL PROCEDURE N/A 04/13/2024    Procedure: APPLICATION OF ROBOTIC ASSISTANCE  FOR SPINAL PROCEDURE;  Surgeon: Van Gelinas, MD;  Location: [facility] OR;  Service: Neurosurgery;  Laterality: N/A;   COLONOSCOPY WITH PROPOFOL  N/A 05/29/2018    Procedure: COLONOSCOPY WITH PROPOFOL ;  Surgeon: Suzette Espy, MD;  Location: [facility] SUITE;  Service: Endoscopy;  Laterality: N/A;  9:45am   KNEE  ARTHROSCOPY WITH MEDIAL MENISECTOMY Left 11/11/2016    Procedure: LEFT KNEE ARTHROSCOPY WITH MEDIAL MENISECTOMY;  Surgeon: Darrin Emerald, MD;  Location: [facility] ORS;  Service: Orthopedics;  Laterality: Left;   Thumb surgery-left   02/25/2020             Family History  Problem Relation Age of Onset   Hypertension Mother     Diabetes Mother     Hypertension Father     Diabetes Father     Diabetes Brother     Colon cancer Neg Hx          Social History:  reports that he has been smoking cigars. He started smoking about 51 years ago. He has been exposed to tobacco smoke. His smokeless tobacco use includes snuff. He reports current alcohol use of about 12.0 standard drinks of alcohol per week. He reports that he does not use drugs. Allergies:  Allergies  No Known Allergies         Medications Prior to Admission  Medication Sig Dispense Refill   amlodipine -olmesartan  (AZOR ) 10-20 MG tablet Take 1 tablet by mouth daily.       esomeprazole  (NEXIUM ) 40 MG capsule Take 40 mg by mouth daily.       gabapentin  (NEURONTIN ) 400 MG capsule Take 1 capsule (400 mg total) by mouth at bedtime. For PAIN (Patient taking differently: Take 400 mg by mouth 3 (three) times daily.) 90 capsule 1   HYDROcodone -acetaminophen  (NORCO) 10-325 MG tablet Take 1 tablet by mouth 2 (two) times daily as needed for severe pain. (Patient taking differently: Take 1 tablet by mouth every 4 (four) hours as needed for severe pain (pain score 7-10).) 30 tablet 0   PROAIR  HFA 108 (90 Base) MCG/ACT inhaler Inhale 2 puffs into the lungs every 6 (six) hours as needed for wheezing or shortness of breath. 18 g 2   rosuvastatin  (CRESTOR ) 5 MG tablet Take 5 mg by mouth daily.       atorvastatin  (LIPITOR) 10 MG tablet TAKE 1 TABLET DAILY (Patient not taking: Reported on 03/27/2024) 90 tablet 3   cetirizine  (ZYRTEC ) 10 MG tablet Take 1 tablet (10 mg total) by mouth daily. (Patient not taking: Reported on 03/27/2024) 90 tablet 3    fluticasone  (FLONASE ) 50 MCG/ACT nasal spray Place 2 sprays into both nostrils daily. (Patient not taking: Reported on 03/27/2024) 16 g 6   meloxicam  (MOBIC ) 15 MG tablet Take 1 tablet (15 mg total) by mouth daily. (Patient not taking: Reported on 03/27/2024) 90 tablet 1   naloxone  (NARCAN ) nasal spray 4 mg/0.1 mL Place 1 spray into the nose.                  Home: Home Living Family/patient expects to be discharged to:: Private residence Living Arrangements: Spouse/significant other Available Help at Discharge: Family, Available 24 hours/day Type of Home: House Home Access: Stairs to enter [employer] of Steps: 6 front, 3 in the back Entrance Stairs-Rails: Can reach both, Left, Right Home Layout: One level Bathroom Shower/Tub: Engineer, manufacturing systems: Standard Home Equipment: Hand held shower head, Shower seat   Functional History: Prior Function Prior Level of Function :  Independent/Modified Independent Mobility Comments: Ind no AD ADLs Comments: ind   Functional Status:  Mobility: Bed Mobility Overal bed mobility: Needs Assistance Bed Mobility: Rolling, Sidelying to Sit, Sit to Sidelying Rolling: Supervision Sidelying to sit: Contact guard assist Sit to sidelying: Min assist (assist mostly with right leg) General bed mobility comments: pt able to report log roll.  Most assist to come to sitting back to supine is for R leg Transfers Overall transfer level: Needs assistance Equipment used: Rolling walker (2 wheels) Transfers: Sit to/from Stand, Bed to chair/wheelchair/BSC Sit to Stand: Contact guard assist Bed to/from chair/wheelchair/BSC transfer type:: Step pivot Step pivot transfers: Min assist General transfer comment: pt having difficulty progressing right leg/foot during transfers, min assist to stabilize RW and support trunk Ambulation/Gait Ambulation/Gait assistance: Min assist, +2 safety/equipment Gait Distance (Feet): 15 Feet Assistive  device: Rolling walker (2 wheels) Gait Pattern/deviations: Step-through pattern, Steppage, Decreased dorsiflexion - right, Decreased stance time - right General Gait Details: chair to follow due to continued orthostatic BPs and symptoms.  Pt also go nauseated in the hallway, burped, has not had a BM yet despite attempting with us  at end of session  Refused laxative. Gait velocity: decreased Gait velocity interpretation: <1.31 ft/sec, indicative of household ambulator   ADL: ADL Overall ADL's : Needs assistance/impaired Eating/Feeding: Independent, Sitting Grooming: Sitting, Set up Upper Body Bathing: Sitting, Set up Lower Body Bathing: Moderate assistance, Sitting/lateral leans Upper Body Dressing : Sitting, Set up Lower Body Dressing: Sitting/lateral leans, Moderate assistance, With adaptive equipment Lower Body Dressing Details (indicate cue type and reason): Educated pt on use of shoe horn and and sock aid for LBD. Mod cues and assist for new learning. Toilet Transfer: Minimal assistance, +2 for physical assistance, Stand-pivot Toilet Transfer Details (indicate cue type and reason): limited activity restrictions per MD orders. only transfer to recliner. Toileting- Clothing Manipulation and Hygiene: Maximal assistance, Sit to/from stand Functional mobility during ADLs: Rolling walker (2 wheels), Minimal assistance General ADL Comments: Pt declined ADLs, pt open to working on OOB mobility-used chair follow since pt is +orthostatic   Cognition: Cognition Orientation Level: Oriented X4 Cognition Arousal: Alert Behavior During Therapy: WFL for tasks assessed/performed   Physical Exam: Blood pressure 117/73, pulse 72, temperature 97.7 F (36.5 C), temperature source Oral, resp. rate 16, height 6' (1.829 m), weight 93 kg, SpO2 93%. Physical Exam Skin:    Comments: Back incision dressing clean dry and intact  Neurological:     Comments: Patient is alert oriented x 3 and follows  commands.    General: No acute distress Mood and affect are appropriate Heart: Regular rate and rhythm no rubs murmurs or extra sounds Lungs: Clear to auscultation, breathing unlabored, no rales or wheezes Abdomen: Positive bowel sounds, soft nontender to palpation, nondistended Extremities: No clubbing, cyanosis, or edema Skin: No evidence of breakdown, no evidence of rash Neurologic: Cranial nerves II through XII intact, motor strength is 5/5 in bilateral deltoid, bicep, tricep, grip, left hip flexor, knee extensors, ankle dorsiflexor and plantar flexor Lower extremity to minus hip flexion 3 - knee extension 4/5 ankle dorsiflexion plantarflexion Sensory exam reduced sensation right L3, right L4 dermatomal distribution Cerebellar exam normal finger to nose to finger  Musculoskeletal: Full range of motion upper extremities.  Lower extremity weakness limits active range of motion but is able to go passively.  No joint swelling      Lab Results Last 48 Hours  No results found for this or any previous visit (from the past 48  hours).   Imaging Results (Last 48 hours)  No results found.         Blood pressure 117/73, pulse 72, temperature 97.7 F (36.5 C), temperature source Oral, resp. rate 16, height 6' (1.829 m), weight 93 kg, SpO2 93%.   Medical Problem List and Plan: 1. Functional deficits secondary to lumbar spondylosis/stenosis/radiculopathy status post L4-5, L5-S1 ALIF, L1-2, L3-4 DLIF, T10 to ilium posterior instrumented fusion 04/11/2024 and second stage 04/13/2024.  Back brace when out of bed applied in sitting position.             -patient may  shower             -ELOS/Goals: 7 to 10 days, supervision goals, patient states that someone told him he would go home on Friday but we discussed that discharge date would not be set until patient evaluated at rehab 2.  Antithrombotics: -DVT/anticoagulation:  Pharmaceutical: Lovenox .  Check vascular study             -antiplatelet  therapy: N/A 3. Pain Management: Neurontin  400 mg 3 times daily, Robaxin  and oxycodone  as needed Has been taking oxycodone  40 to 60 mg/day postoperatively.  Premorbidly he was on chronic hydrocodone . 4. Mood/Behavior/Sleep: Provide emotional support.  Resume Zoloft  100 mg daily             -antipsychotic agents: N/A 5. Neuropsych/cognition: This patient is capable of making decisions on his own behalf. 6. Skin/Wound Care: Routine skin checks 7. Fluids/Electrolytes/Nutrition: Routine in and outs with follow-up chemistries 8.  Acute blood loss anemia.  Follow-up CBC 9.  Hypertension.  Norvasc  10 mg daily, Avapro  150 mg daily.  Monitor with increased mobility 10 hyperlipidemia.  Crestor  11.  COPD/tobacco use.  Continue nebulizer as needed.  Provide counseling regarding cessation of tobacco 12.  History of alcohol use.  Provide counseling 13.  GERD.  Protonix    Everlyn Hockey Angiulli, PA-C 04/23/2024 "I have personally performed a face to face diagnostic evaluation of this patient.  Additionally, I have reviewed and concur with the physician assistant's documentation above." Genetta Kenning M.D. [facility] Fellow [employer] Diplomate Am Board of Electrodiagnostic Med Fellow Am Board of Interventional Pain

## 2024-04-24 ENCOUNTER — Inpatient Hospital Stay (EMERGENCY_DEPARTMENT_HOSPITAL)

## 2024-04-24 ENCOUNTER — Emergency Department (HOSPITAL_COMMUNITY)

## 2024-04-24 DIAGNOSIS — M7989 Other specified soft tissue disorders: Secondary | ICD-10-CM

## 2024-04-24 DIAGNOSIS — M5416 Radiculopathy, lumbar region: Secondary | ICD-10-CM | POA: Diagnosis not present

## 2024-04-24 LAB — COMPREHENSIVE METABOLIC PANEL WITH GFR
ALT: 32 U/L (ref 0–44)
AST: 37 U/L (ref 15–41)
Albumin: 2.7 g/dL — ABNORMAL LOW (ref 3.5–5.0)
Alkaline Phosphatase: 132 U/L — ABNORMAL HIGH (ref 38–126)
Anion gap: 8 (ref 5–15)
BUN: 13 mg/dL (ref 6–20)
CO2: 25 mmol/L (ref 22–32)
Calcium: 8.9 mg/dL (ref 8.9–10.3)
Chloride: 95 mmol/L — ABNORMAL LOW (ref 98–111)
Creatinine, Ser: 0.63 mg/dL (ref 0.61–1.24)
GFR, Estimated: >60 mL/min (ref >60)
Glucose, Bld: 108 mg/dL — ABNORMAL HIGH (ref 70–99)
Potassium: 4.4 mmol/L (ref 3.5–5.1)
Sodium: 128 mmol/L — ABNORMAL LOW (ref 135–145)
Total Bilirubin: 0.8 mg/dL (ref 0.0–1.2)
Total Protein: 6 g/dL — ABNORMAL LOW (ref 6.5–8.1)

## 2024-04-24 LAB — CBC WITH DIFFERENTIAL/PLATELET
Abs Immature Granulocytes: 0.17 10*3/uL — ABNORMAL HIGH (ref 0.00–0.07)
Basophils Absolute: 0 10*3/uL (ref 0.0–0.1)
Basophils Relative: 0 %
Eosinophils Absolute: 0.2 10*3/uL (ref 0.0–0.5)
Eosinophils Relative: 2 %
HCT: 27.2 % — ABNORMAL LOW (ref 39.0–52.0)
Hemoglobin: 9.4 g/dL — ABNORMAL LOW (ref 13.0–17.0)
Immature Granulocytes: 2 %
Lymphocytes Relative: 13 %
Lymphs Abs: 1.2 10*3/uL (ref 0.7–4.0)
MCH: 29.7 pg (ref 26.0–34.0)
MCHC: 34.6 g/dL (ref 30.0–36.0)
MCV: 85.8 fL (ref 80.0–100.0)
Monocytes Absolute: 0.7 10*3/uL (ref 0.1–1.0)
Monocytes Relative: 7 %
Neutro Abs: 7 10*3/uL (ref 1.7–7.7)
Neutrophils Relative %: 76 %
Platelets: 477 10*3/uL — ABNORMAL HIGH (ref 150–400)
RBC: 3.17 MIL/uL — ABNORMAL LOW (ref 4.22–5.81)
RDW: 12.7 % (ref 11.5–15.5)
WBC: 9.2 10*3/uL (ref 4.0–10.5)
nRBC: 0 % (ref 0.0–0.2)

## 2024-04-24 MED ORDER — POLYETHYLENE GLYCOL 3350 17 G PO PACK: 17.0000 g | PACK | Freq: Every day | ORAL | Status: DC | PRN

## 2024-04-24 MED ORDER — DOCUSATE SODIUM 100 MG PO CAPS
100.0000 mg | ORAL_CAPSULE | Freq: Two times a day (BID) | ORAL | Status: DC | PRN
Start: 2024-04-24 — End: 2024-04-30

## 2024-04-24 NOTE — Evaluation (Signed)
 Physical Therapy Assessment and Plan  Patient Details  Name: Bradley Fisher MRN: [id number] Date of Birth: July 28, 1966  PT Diagnosis: Abnormality of gait, Difficulty walking, Impaired sensation, Low back pain, Muscle weakness, and Pain in joint Rehab Potential: Fair ELOS: 7-10 days   Today's Date: 04/24/2024 PT Individual Time: 0945-1100 PT Individual Time Calculation (min): 75 min    Hospital Problem: Principal Problem:   Lumbar radiculopathy   Past Medical History:  Past Medical History:  Diagnosis Date   Arthritis    Asthma    COPD (chronic obstructive pulmonary disease) (HCC)    Depression    GERD (gastroesophageal reflux disease)    Hyperlipidemia    Hypertension    Seizures (HCC)    unknow etiology; on meds for this; no seizures since on meds 10 years ago   Past Surgical History:  Past Surgical History:  Procedure Laterality Date   ABDOMINAL EXPOSURE N/A 04/11/2024   Procedure: ABDOMINAL EXPOSURE;  Surgeon: Young Hensen, MD;  Location: [facility] OR;  Service: Vascular;  Laterality: N/A;   ANTERIOR LAT LUMBAR FUSION Right 04/11/2024   Procedure: LUMBAR ONE-TWO, LUMBAR THREE-FOUR ANTERIOR LATERAL LUMBAR FUSION;  Surgeon: Van Gelinas, MD;  Location: [facility] OR;  Service: Neurosurgery;  Laterality: Right;  L12, L34 DLIF RT SIDE LATERAL DECUB   ANTERIOR LUMBAR FUSION Right 04/11/2024   Procedure: LUMBAR FOUR-FIVE, LUMBAR FIVE-SACRAL ONE ANTERIOR LUMBAR FUSION;  Surgeon: Van Gelinas, MD;  Location: [facility] OR;  Service: Neurosurgery;  Laterality: Right;  ALIF L45, L5S1   APPLICATION OF CELL SAVER N/A 04/13/2024   Procedure: APPLICATION OF CELL SAVER;  Surgeon: Van Gelinas, MD;  Location: [facility] OR;  Service: Neurosurgery;  Laterality: N/A;   APPLICATION OF ROBOTIC ASSISTANCE FOR SPINAL PROCEDURE N/A 04/13/2024   Procedure: APPLICATION OF ROBOTIC ASSISTANCE FOR SPINAL PROCEDURE;  Surgeon: Van Gelinas, MD;  Location: [facility] OR;  Service: Neurosurgery;  Laterality: N/A;    COLONOSCOPY WITH PROPOFOL  N/A 05/29/2018   Procedure: COLONOSCOPY WITH PROPOFOL ;  Surgeon: Suzette Espy, MD;  Location: [facility] SUITE;  Service: Endoscopy;  Laterality: N/A;  9:45am   KNEE ARTHROSCOPY WITH MEDIAL MENISECTOMY Left 11/11/2016   Procedure: LEFT KNEE ARTHROSCOPY WITH MEDIAL MENISECTOMY;  Surgeon: Darrin Emerald, MD;  Location: [facility] ORS;  Service: Orthopedics;  Laterality: Left;   Thumb surgery-left  02/25/2020    Assessment & Plan Clinical Impression: Bradley Fisher is a 58 year old right-handed male with history significant for hypertension, depression, hyperlipidemia, COPD/tobacco use, GERD, alcohol use, remote seizure disorder no longer on antiseizure medication and chronic back pain.  Per chart review patient lives with spouse.  1 level home 3 steps to enter.  Independent prior to admission.  Presented 04/11/2024 with progressive back pain radiating to the lower extremities.  X-rays and imaging revealed multilevel thoracolumbar degenerative disease with multilevel spinal stenosis/radiculopathy.  Patient underwent arthrodesis L5-S1 anterior interbody technique via anterior retroperitoneal approach as well as L4-5 retroperitoneal approach, L3-4 and L1-2 anterior interbody technique via right retroperitoneal Transpsoas approach with anterior instrumentation/fixation with integrated bone anchor placement L5-S1 and L4-5 04/11/2024 by Dr. Arvilla Birmingham and assistance from vascular Dr. Jimmye Moulds followed by segmental instrumentation with pedicle and S2 A1 screw and rod construction at T10-T11-12-L1-L2-L3-L4-L5-S1-S2 a 1 04/13/2024.  Back brace when out of bed applied in sitting position.  Acute blood loss anemia 7.8-9.1.  Patient was cleared to begin Lovenox  for DVT prophylaxis.  Therapy evaluations completed due to patient's decreased functional mobility was admitted for a comprehensive  rehab program.    Patient transferred to CIR on 04/23/2024 .   Patient currently requires min with  mobility secondary to muscle weakness, decreased cardiorespiratoy endurance, and decreased postural control, decreased balance strategies, and difficulty maintaining precautions.  Prior to hospitalization, patient was independent  with mobility and lived with Spouse in a House home.  Home access is 5-6 front, 4 in the backStairs to enter.  Patient will benefit from skilled PT intervention to maximize safe functional mobility, minimize fall risk, and decrease caregiver burden for planned discharge home with 24 hour supervision.  Anticipate patient will benefit from follow up HH at discharge.  PT - End of Session Activity Tolerance: Tolerates 10 - 20 min activity with multiple rests Endurance Deficit: Yes Endurance Deficit Description: reports dizziness with some activities but BP stable PT Assessment Rehab Potential (ACUTE/IP ONLY): Fair PT Barriers to Discharge: Inaccessible home environment;Home environment access/layout;Decreased caregiver support PT Barriers to Discharge Comments: pt's wife also having surgery this week PT Patient demonstrates impairments in the following area(s): Balance;Pain;Motor;Sensory;Safety PT Transfers Functional Problem(s): Bed Mobility;Bed to Chair;Car PT Locomotion Functional Problem(s): Ambulation;Wheelchair Mobility;Stairs PT Plan PT Intensity: Minimum of 1-2 x/day ,45 to 90 minutes PT Frequency: 5 out of 7 days PT Duration Estimated Length of Stay: 7-10 days PT Treatment/Interventions: Ambulation/gait training;Discharge planning;Functional mobility training;DME/adaptive equipment instruction;Pain management;Psychosocial support;Splinting/orthotics;Therapeutic Activities;UE/LE Strength taining/ROM;Visual/perceptual remediation/compensation;Balance/vestibular training;Cognitive remediation/compensation;Community reintegration;Disease management/prevention;Functional electrical stimulation;Neuromuscular re-education;Patient/family education;Skin care/wound  management;Stair training;Therapeutic Exercise;UE/LE Coordination activities;Wheelchair propulsion/positioning PT Transfers Anticipated Outcome(s): supervision with RW PT Locomotion Anticipated Outcome(s): supervision with RW, mod I w/c PT Recommendation Follow Up Recommendations: Home health PT Patient destination: Home Equipment Recommended: To be determined   PT Evaluation Precautions/Restrictions Precautions Precautions: Back;Fall Precaution/Restrictions Comments: recalled 3/3 without cue after reviewing with OT earlier in the am Required Braces or Orthoses: Spinal Brace Spinal Brace: Lumbar corset;Applied in sitting position Restrictions Weight Bearing Restrictions Per Provider Order: No Other Position/Activity Restrictions: +orthostatics General   Vital SignsTherapy Vitals BP: 116/73 Patient Position (if appropriate): Sitting Pain Pain Assessment Pain Scale: 0-10 Pain Score: 10-Worst pain ever Pain Location: Back Pain Intervention(s): Medication (See eMAR) Pain Interference Pain Interference Pain Effect on Sleep: 2. Occasionally Pain Interference with Therapy Activities: 2. Occasionally Pain Interference with Day-to-Day Activities: 2. Occasionally Home Living/Prior Functioning Home Living Living Arrangements: Spouse/significant other Available Help at Discharge: Family;Available 24 hours/day Type of Home: House Home Access: Stairs to enter [employer] of Steps: 5-6 front, 4 in the back Entrance Stairs-Rails: Can reach both;Left;Right Home Layout: One level  Lives With: Spouse Prior Function Level of Independence: Independent with basic ADLs;Independent with gait;Independent with transfers  Able to Take Stairs?: Yes Driving: No Vocation: Retired Optometrist - History Ability to See in Adequate Light: 0 Adequate Perception Perception: Within Functional Limits Praxis Praxis: WFL  Cognition Overall Cognitive Status: Within Functional  Limits for tasks assessed Arousal/Alertness: Awake/alert Orientation Level: Oriented to person;Oriented to place;Oriented to situation Memory: Appears intact Awareness: Appears intact Problem Solving: Appears intact Safety/Judgment: Appears intact Sensation Sensation Light Touch: Impaired by gross assessment (numbness and tingling distal to thigh) Hot/Cold: Appears Intact Proprioception: Appears Intact Coordination Gross Motor Movements are Fluid and Coordinated: No Coordination and Movement Description: limited by pain and Motor  Motor Motor: Other (comment) Motor - Skilled Clinical Observations: LE weakness, R>L   Trunk/Postural Assessment  Cervical Assessment Cervical Assessment: Within Functional Limits Thoracic Assessment Thoracic Assessment: Within Functional Limits Lumbar Assessment Lumbar Assessment: Exceptions to [facility] (back precautions) Postural Control Postural Control: Within Functional Limits  Balance Balance Balance Assessed: Yes Static Sitting Balance Static Sitting - Balance Support: Feet supported Static Sitting - Level of Assistance: 6: Modified independent (Device/Increase time) Dynamic Sitting Balance Dynamic Sitting - Balance Support: Feet supported Dynamic Sitting - Level of Assistance: 5: Stand by assistance Static Standing Balance Static Standing - Balance Support: During functional activity Static Standing - Level of Assistance: 4: Min assist Dynamic Standing Balance Dynamic Standing - Balance Support: During functional activity Dynamic Standing - Level of Assistance: 4: Min assist Extremity Assessment      RLE Assessment RLE Assessment: Exceptions to [facility] General Strength Comments: 2+/5 hip flexion, 4/5 hip abduction/adduction, 4+/5 knee and ankle LLE Assessment LLE Assessment: Within Functional Limits General Strength Comments: grossly 4+/5  Care Tool Care Tool Bed Mobility Roll left and right activity   Roll left and right assist  level: Supervision/Verbal cueing    Sit to lying activity   Sit to lying assist level: Contact Guard/Touching assist    Lying to sitting on side of bed activity   Lying to sitting on side of bed assist level: the ability to move from lying on the back to sitting on the side of the bed with no back support.: Contact Guard/Touching assist     Care Tool Transfers Sit to stand transfer   Sit to stand assist level: Contact Guard/Touching assist    Chair/bed transfer   Chair/bed transfer assist level: Minimal Assistance - Patient > 75%    Car transfer Car transfer activity did not occur: Safety/medical concerns        Care Tool Locomotion Ambulation   Assist level: Minimal Assistance - Patient > 75% Assistive device: Walker-rolling Max distance: 15  Walk 10 feet activity   Assist level: Minimal Assistance - Patient > 75% Assistive device: Walker-rolling   Walk 50 feet with 2 turns activity Walk 50 feet with 2 turns activity did not occur: Safety/medical concerns      Walk 150 feet activity Walk 150 feet activity did not occur: Safety/medical concerns      Walk 10 feet on uneven surfaces activity Walk 10 feet on uneven surfaces activity did not occur: Safety/medical concerns      Stairs Stair activity did not occur: Safety/medical concerns        Walk up/down 1 step activity Walk up/down 1 step or curb (drop down) activity did not occur: Safety/medical concerns      Walk up/down 4 steps activity Walk up/down 4 steps activity did not occur: Safety/medical concerns      Walk up/down 12 steps activity Walk up/down 12 steps activity did not occur: Safety/medical concerns      Pick up small objects from floor Pick up small object from the floor (from standing position) activity did not occur: Safety/medical concerns      Wheelchair Is the patient using a wheelchair?: Yes Type of Wheelchair: Manual   Wheelchair assist level: Supervision/Verbal cueing Max wheelchair  distance: 5  Wheel 50 feet with 2 turns activity Wheelchair 50 feet with 2 turns activity did not occur: Refused    Wheel 150 feet activity Wheelchair 150 feet activity did not occur: Refused      Refer to Care Plan for Long Term Goals  SHORT TERM GOAL WEEK 1 PT Short Term Goal 1 (Week 1): =LTGs d/t ELOS  Recommendations for other services: None   Skilled Therapeutic Intervention Evaluation completed (see details above) with patient education regarding purpose of PT evaluation, PT POC and goals, therapy schedule, weekly team  meetings, and other CIR information including safety plan and fall risk safety. Eval was limited by pain and frequent BM's this session. Pt performed Stand pivot transfer to BSC x 3 and <> w/c x 2 with CGA-min a. Continent bowel void x 2. Required assist for hygiene d/t back precautions.  Pt performed the below functional mobility tasks with the specified levels of skilled cuing and assistance.  Pt with report of dizziness with activity, resolved with rest, BP seated EOB =116/73(86), HR=92. Pt returned to bed after session despite encouragement to stay upright to improve upright tolerance. Pt was left in bed and was left with all needs in reach and alarm active.   Mobility Bed Mobility Bed Mobility: Rolling Right;Rolling Left;Supine to Sit;Sit to Supine Rolling Right: Supervision/verbal cueing Rolling Left: Supervision/Verbal cueing Supine to Sit: Contact Guard/Touching assist Sit to Supine: Contact Guard/Touching assist Transfers Transfers: Sit to Stand;Stand Pivot Transfers Sit to Stand: Contact Guard/Touching assist Stand Pivot Transfers: Contact Guard/Touching assist Transfer (Assistive device): Rolling walker Locomotion  Gait Ambulation: Yes Gait Assistance: Minimal Assistance - Patient > 75% Gait Distance (Feet): 15 Feet Assistive device: Rolling walker Gait Assistance Details: Verbal cues for safe use of DME/AE;Verbal cues for  precautions/safety Gait Gait: Yes Gait Pattern: Impaired Gait Pattern: Right steppage;Decreased hip/knee flexion - right Stairs / Additional Locomotion Stairs: No Wheelchair Mobility Wheelchair Mobility: Yes Wheelchair Assistance: Doctor, general practice: Both upper extremities Wheelchair Parts Management: Needs assistance Distance: 47ft   Discharge Criteria: Patient will be discharged from PT if patient refuses treatment 3 consecutive times without medical reason, if treatment goals not met, if there is a change in medical status, if patient makes no progress towards goals or if patient is discharged from hospital.  The above assessment, treatment plan, treatment alternatives and goals were discussed and mutually agreed upon: by patient  Tex Filbert 04/24/2024, 12:39 PM

## 2024-04-24 NOTE — Progress Notes (Signed)
 Bilateral lower extremity venous duplex has been completed. Preliminary results can be found in CV Proc through chart review.   04/24/24 4:09 PM Birda Buffy RVT

## 2024-04-24 NOTE — Progress Notes (Signed)
 Inpatient Rehabilitation Care Coordinator Assessment and Plan Patient Details  Name: Bradley Fisher MRN: [id number] Date of Birth: May 28, 1966  Today's Date: 04/24/2024  Hospital Problems: Principal Problem:   Lumbar radiculopathy  Past Medical History:  Past Medical History:  Diagnosis Date   Arthritis    Asthma    COPD (chronic obstructive pulmonary disease) (HCC)    Depression    GERD (gastroesophageal reflux disease)    Hyperlipidemia    Hypertension    Seizures (HCC)    unknow etiology; on meds for this; no seizures since on meds 10 years ago   Past Surgical History:  Past Surgical History:  Procedure Laterality Date   ABDOMINAL EXPOSURE N/A 04/11/2024   Procedure: ABDOMINAL EXPOSURE;  Surgeon: Young Hensen, MD;  Location: [facility] OR;  Service: Vascular;  Laterality: N/A;   ANTERIOR LAT LUMBAR FUSION Right 04/11/2024   Procedure: LUMBAR ONE-TWO, LUMBAR THREE-FOUR ANTERIOR LATERAL LUMBAR FUSION;  Surgeon: Van Gelinas, MD;  Location: [facility] OR;  Service: Neurosurgery;  Laterality: Right;  L12, L34 DLIF RT SIDE LATERAL DECUB   ANTERIOR LUMBAR FUSION Right 04/11/2024   Procedure: LUMBAR FOUR-FIVE, LUMBAR FIVE-SACRAL ONE ANTERIOR LUMBAR FUSION;  Surgeon: Van Gelinas, MD;  Location: [facility] OR;  Service: Neurosurgery;  Laterality: Right;  ALIF L45, L5S1   APPLICATION OF CELL SAVER N/A 04/13/2024   Procedure: APPLICATION OF CELL SAVER;  Surgeon: Van Gelinas, MD;  Location: [facility] OR;  Service: Neurosurgery;  Laterality: N/A;   APPLICATION OF ROBOTIC ASSISTANCE FOR SPINAL PROCEDURE N/A 04/13/2024   Procedure: APPLICATION OF ROBOTIC ASSISTANCE FOR SPINAL PROCEDURE;  Surgeon: Van Gelinas, MD;  Location: [facility] OR;  Service: Neurosurgery;  Laterality: N/A;   COLONOSCOPY WITH PROPOFOL  N/A 05/29/2018   Procedure: COLONOSCOPY WITH PROPOFOL ;  Surgeon: Suzette Espy, MD;  [facility];  Service: Endoscopy;  Laterality: N/A;  9:45am   KNEE ARTHROSCOPY WITH MEDIAL  MENISECTOMY Left 11/11/2016   Procedure: LEFT KNEE ARTHROSCOPY WITH MEDIAL MENISECTOMY;  Surgeon: Darrin Emerald, MD;  Location: [facility] ORS;  Service: Orthopedics;  Laterality: Left;   Thumb surgery-left  02/25/2020   Social History:  reports that he has been smoking cigars. He started smoking about 51 years ago. He has been exposed to tobacco smoke. His smokeless tobacco use includes snuff. He reports current alcohol use of about 12.0 standard drinks of alcohol per week. He reports that he does not use drugs.  Family / Support Systems Marital Status: Married How Long?: 7-8 years Patient Roles: Spouse, Parent, Other (Comment) (neighbor) Spouse/Significant Other: Wallene Gum [phone] Children: Wife has two son;s who are supportive Other Supports: Friends Anticipated Caregiver: Wife Ability/Limitations of Caregiver: Wife will be having surgery for blocked arteries on Friday with stents placed may be OP or may be admitted Caregiver Availability: Other (Comment) (Wife having surgery and will not be able to assist pt) Family Dynamics: Close with wife and two step-son's they do assist one another. Pt feels he will get independent by Friday and wants to go home then.  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 He feels he can be but will need to see in therapies. He is somewhat impulsive according to OT who evaluated him Recent Psychosocial Issues: other health issues Psychiatric History: Hx depression takes medications for this and feels helps him. If here long enough will place on neuro-psych list Substance Abuse History: hx-ETOH and chews  Patient / Family Perceptions, Expectations & Goals Pt/Family understanding of illness & functional limitations: Pt and wife can explain his back surgery and hopes it will make him better and in less pain. 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, neighbor, etc Anticipated changes in roles/activities/participation: resume Pt/family expectations/goals: Pt states: " I want to go home on Friday otherwise it is too long."  Wife states: " Saturday would be better and I will talk with him."  [employer]: None Premorbid Home Care/DME Agencies: None Transportation available at discharge: wife and step-sons Is the patient able to respond to transportation needs?: Yes In the past 12 months, has lack of transportation kept you from medical appointments or from getting medications?: No In the past 12 months, has lack of transportation kept you from meetings, work, or from getting things needed for daily living?: No Resource referrals recommended: Neuropsychology  Discharge Planning Living Arrangements: Spouse/significant other Support Systems: Spouse/significant other, Children, Friends/neighbors Type of Residence: Private residence Insurance Resources: Media planner (specify) ([employer] Medicare) Surveyor, quantity Resources: SSD Financial Screen Referred: No Living Expenses: Mortgage Money  Management: Spouse Does the patient have any problems obtaining your medications?: No Home Management: wife Patient/Family Preliminary Plans: Return home with wife who is having surgery on Friday and will only be able to provide supervision level. Will await therapy evaluations Care Coordinator Barriers to Discharge: Insurance for SNF coverage, Decreased caregiver support Care Coordinator Anticipated Follow Up Needs: HH/OP  Clinical Impression Pleasant yet stubborn gentleman who wants what he wants. He plans to go home on Friday. Wife will talk with regarding Sat would work better if he is ready to go. Will await therapy evaluations and work on discharge plans.  Mardell Shade 04/24/2024, 9:31 AM

## 2024-04-24 NOTE — Plan of Care (Signed)
  Problem: RH Balance Goal: LTG Patient will maintain dynamic standing with ADLs (OT) Description: LTG:  Patient will maintain dynamic standing balance with assist during activities of daily living (OT)  Flowsheets (Taken 04/24/2024 1246) LTG: Pt will maintain dynamic standing balance during ADLs with: Supervision/Verbal cueing   Problem: RH Grooming Goal: LTG Patient will perform grooming w/assist,cues/equip (OT) Description: LTG: Patient will perform grooming with assist, with/without cues using equipment (OT) Flowsheets (Taken 04/24/2024 1246) LTG: Pt will perform grooming with assistance level of: Supervision/Verbal cueing   Problem: RH Bathing Goal: LTG Patient will bathe all body parts with assist levels (OT) Description: LTG: Patient will bathe all body parts with assist levels (OT) Flowsheets (Taken 04/24/2024 1246) LTG: Pt will perform bathing with assistance level/cueing: Supervision/Verbal cueing   Problem: RH Dressing Goal: LTG Patient will perform upper body dressing (OT) Description: LTG Patient will perform upper body dressing with assist, with/without cues (OT). Flowsheets (Taken 04/24/2024 1246) LTG: Pt will perform upper body dressing with assistance level of: Set up assist Goal: LTG Patient will perform lower body dressing w/assist (OT) Description: LTG: Patient will perform lower body dressing with assist, with/without cues in positioning using equipment (OT) Flowsheets (Taken 04/24/2024 1246) LTG: Pt will perform lower body dressing with assistance level of: Supervision/Verbal cueing   Problem: RH Toileting Goal: LTG Patient will perform toileting task (3/3 steps) with assistance level (OT) Description: LTG: Patient will perform toileting task (3/3 steps) with assistance level (OT)  Flowsheets (Taken 04/24/2024 1246) LTG: Pt will perform toileting task (3/3 steps) with assistance level: Supervision/Verbal cueing   Problem: RH Toilet Transfers Goal: LTG Patient will  perform toilet transfers w/assist (OT) Description: LTG: Patient will perform toilet transfers with assist, with/without cues using equipment (OT) Flowsheets (Taken 04/24/2024 1246) LTG: Pt will perform toilet transfers with assistance level of: Supervision/Verbal cueing   Problem: RH Tub/Shower Transfers Goal: LTG Patient will perform tub/shower transfers w/assist (OT) Description: LTG: Patient will perform tub/shower transfers with assist, with/without cues using equipment (OT) Flowsheets (Taken 04/24/2024 1246) LTG: Pt will perform tub/shower stall transfers with assistance level of: Supervision/Verbal cueing

## 2024-04-24 NOTE — Progress Notes (Signed)
 Inpatient Rehabilitation  Patient information reviewed and entered into eRehab system by [facility]. Karen Kays., CCC/SLP, PPS Coordinator.  Information including medical coding, functional ability and quality indicators will be reviewed and updated through discharge.

## 2024-04-24 NOTE — Evaluation (Signed)
 Occupational Therapy Assessment and Plan  Patient Details  Name: Bradley Fisher MRN: [id number] Date of Birth: 11/01/1966  OT Diagnosis: lumbago (low back pain) and muscle weakness (generalized) Rehab Potential: Rehab Potential (ACUTE ONLY): Good ELOS: 7-10 days   Today's Date: 04/24/2024 OT Individual Time: 0800-0900 & 1415-1445 OT Individual Time Calculation (min): 60 min  & 30 min Missed minutes: -15 min;fatigue/refusal, see flowsheets  Hospital Problem: Principal Problem:   Lumbar radiculopathy   Past Medical History:  Past Medical History:  Diagnosis Date   Arthritis    Asthma    COPD (chronic obstructive pulmonary disease) (HCC)    Depression    GERD (gastroesophageal reflux disease)    Hyperlipidemia    Hypertension    Seizures (HCC)    unknow etiology; on meds for this; no seizures since on meds 10 years ago   Past Surgical History:  Past Surgical History:  Procedure Laterality Date   ABDOMINAL EXPOSURE N/A 04/11/2024   Procedure: ABDOMINAL EXPOSURE;  Surgeon: Young Hensen, MD;  Location: [facility] OR;  Service: Vascular;  Laterality: N/A;   ANTERIOR LAT LUMBAR FUSION Right 04/11/2024   Procedure: LUMBAR ONE-TWO, LUMBAR THREE-FOUR ANTERIOR LATERAL LUMBAR FUSION;  Surgeon: Van Gelinas, MD;  Location: [facility] OR;  Service: Neurosurgery;  Laterality: Right;  L12, L34 DLIF RT SIDE LATERAL DECUB   ANTERIOR LUMBAR FUSION Right 04/11/2024   Procedure: LUMBAR FOUR-FIVE, LUMBAR FIVE-SACRAL ONE ANTERIOR LUMBAR FUSION;  Surgeon: Van Gelinas, MD;  Location: [facility] OR;  Service: Neurosurgery;  Laterality: Right;  ALIF L45, L5S1   APPLICATION OF CELL SAVER N/A 04/13/2024   Procedure: APPLICATION OF CELL SAVER;  Surgeon: Van Gelinas, MD;  Location: [facility] OR;  Service: Neurosurgery;  Laterality: N/A;   APPLICATION OF ROBOTIC ASSISTANCE FOR SPINAL PROCEDURE N/A 04/13/2024   Procedure: APPLICATION OF ROBOTIC ASSISTANCE FOR SPINAL PROCEDURE;  Surgeon: Van Gelinas, MD;   Location: [facility] OR;  Service: Neurosurgery;  Laterality: N/A;   COLONOSCOPY WITH PROPOFOL  N/A 05/29/2018   Procedure: COLONOSCOPY WITH PROPOFOL ;  Surgeon: Suzette Espy, MD;  Location: [facility] ENDO SUITE;  Service: Endoscopy;  Laterality: N/A;  9:45am   KNEE ARTHROSCOPY WITH MEDIAL MENISECTOMY Left 11/11/2016   Procedure: LEFT KNEE ARTHROSCOPY WITH MEDIAL MENISECTOMY;  Surgeon: Darrin Emerald, MD;  Location: [facility] ORS;  Service: Orthopedics;  Laterality: Left;   Thumb surgery-left  02/25/2020    Assessment & Plan Clinical Impression: Bradley Fisher is a 58 year old right-handed male with history significant for hypertension, depression, hyperlipidemia, COPD/tobacco use, GERD, alcohol use, remote seizure disorder no longer on antiseizure medication and chronic back pain. Per chart review patient lives with spouse. 1 level home 3 steps to enter. Independent prior to admission. Presented 04/11/2024 with progressive back pain radiating to the lower extremities. X-rays and imaging revealed multilevel thoracolumbar degenerative disease with multilevel spinal stenosis/radiculopathy. Patient underwent arthrodesis L5-S1 anterior interbody technique via anterior retroperitoneal approach as well as L4-5 retroperitoneal approach, L3-4 and L1-2 anterior interbody technique via right retroperitoneal Transpsoas approach with anterior instrumentation/fixation with integrated bone anchor placement L5-S1 and L4-5 04/11/2024 by Dr. Arvilla Birmingham and assistance from vascular Dr. Jimmye Moulds followed by segmental instrumentation with pedicle and S2 A1 screw and rod construction at T10-T11-12-L1-L2-L3-L4-L5-S1-S2 a 1 04/13/2024. Back brace when out of bed applied in sitting position. Acute blood loss anemia 7.8-9.1. Patient was cleared to begin Lovenox  for DVT prophylaxis. Therapy evaluations completed due to patient's decreased functional mobility was admitted for a comprehensive rehab program. .  Patient transferred to CIR on  04/23/2024 .    Patient currently requires min-mod with basic self-care skills secondary to muscle weakness and muscle joint tightness, decreased cardiorespiratoy endurance, unbalanced muscle activation, and decreased sitting balance, decreased standing balance, decreased balance strategies, and difficulty maintaining precautions.  Prior to hospitalization, patient could complete ADLs with independent .  Patient will benefit from skilled intervention to decrease level of assist with basic self-care skills and increase independence with basic self-care skills prior to discharge home with care partner.  Anticipate patient will require 24 hour supervision and follow up home health.  OT - End of Session Activity Tolerance: Tolerates 30+ min activity without fatigue Endurance Deficit: Yes Endurance Deficit Description: reports dizziness with some activities but BP stable OT Assessment Rehab Potential (ACUTE ONLY): Good OT Barriers to Discharge: Decreased caregiver support OT Patient demonstrates impairments in the following area(s): Balance;Safety;Endurance OT Basic ADL's Functional Problem(s): Grooming;Bathing;Dressing OT Transfers Functional Problem(s): Toilet;Tub/Shower OT Additional Impairment(s): None OT Plan OT Intensity: Minimum of 1-2 x/day, 45 to 90 minutes OT Frequency: 5 out of 7 days OT Duration/Estimated Length of Stay: 7-10 days OT Treatment/Interventions: Balance/vestibular training;Discharge planning;Pain management;Self Care/advanced ADL retraining;Therapeutic Activities;UE/LE Coordination activities;Cognitive remediation/compensation;Disease mangement/prevention;Functional mobility training;Patient/family education;Skin care/wound managment;Therapeutic Exercise;Community reintegration;DME/adaptive equipment instruction;Neuromuscular re-education;Psychosocial support;UE/LE Strength taining/ROM;Wheelchair propulsion/positioning OT Self Feeding Anticipated Outcome(s): SBA OT Basic  Self-Care Anticipated Outcome(s): SBA OT Toileting Anticipated Outcome(s): SBA OT Bathroom Transfers Anticipated Outcome(s): SBA OT Recommendation Recommendations for Other Services: Therapeutic Recreation consult Therapeutic Recreation Interventions: Pet therapy Patient destination: Home Follow Up Recommendations: Home health OT;24 hour supervision/assistance Equipment Recommended: To be determined   OT Evaluation Precautions/Restrictions  Precautions Precautions: Back;Fall Precaution/Restrictions Comments: recalled 3/3 without cue after reviewing with OT earlier in the am Required Braces or Orthoses: Spinal Brace Spinal Brace: Lumbar corset;Applied in sitting position Restrictions Weight Bearing Restrictions Per Provider Order: No Other Position/Activity Restrictions: +orthostatics Pain Pain Assessment Pain Scale: 0-10 Pain Score: 10-Worst pain ever Pain Location: Back Pain Intervention(s): Medication (See eMAR) Home Living/Prior Functioning Home Living Family/patient expects to be discharged to:: Private residence Living Arrangements: Spouse/significant other Available Help at Discharge: Family, Available 24 hours/day Type of Home: House Home Access: Stairs to enter [employer] of Steps: 5-6 front, 4 in the back Entrance Stairs-Rails: Can reach both, Left, Right Home Layout: One level Bathroom Shower/Tub: Tub/shower unit (does have shower chair) Bathroom Toilet: Standard Bathroom Accessibility: Yes  Lives With: Spouse IADL History Homemaking Responsibilities: Yes Meal Prep Responsibility: Primary Shopping Responsibility: Secondary Current License: No Leisure and Hobbies: Medical illustrator Prior Function Level of Independence: Independent with basic ADLs, Independent with gait, Independent with transfers  Able to Take Stairs?: Yes Driving: No Vocation: Retired Leisure: Hobbies-yes (Comment) Vision Baseline Vision/History: 1 Wears glasses Ability to See  in Adequate Light: 0 Adequate Patient Visual Report: No change from baseline Vision Assessment?: No apparent visual deficits Perception  Perception: Within Functional Limits Praxis Praxis: WFL Cognition Cognition Overall Cognitive Status: Within Functional Limits for tasks assessed Arousal/Alertness: Awake/alert Orientation Level: Person;Place;Situation Person: Oriented Place: Oriented Situation: Oriented Memory: Appears intact Awareness: Appears intact Problem Solving: Appears intact Safety/Judgment: Appears intact Brief Interview for Mental Status (BIMS) Repetition of Three Words (First Attempt): 3 Temporal Orientation: Year: Correct Temporal Orientation: Month: Missed by more than 1 month Temporal Orientation: Day: Correct Recall: "Sock": Yes, no cue required Recall: "Blue": No, could not recall Recall: "Bed": Yes, no cue required BIMS Summary Score: 11 Sensation Sensation Light Touch: Impaired by gross assessment (numbness and tingling distal to  thigh) Hot/Cold: Appears Intact Proprioception: Appears Intact Coordination Gross Motor Movements are Fluid and Coordinated: No Coordination and Movement Description: limited by pain and Motor  Motor Motor: Other (comment) Motor - Skilled Clinical Observations: LE weakness, R>L  Trunk/Postural Assessment  Cervical Assessment Cervical Assessment: Within Functional Limits Thoracic Assessment Thoracic Assessment: Within Functional Limits Lumbar Assessment Lumbar Assessment: Exceptions to [facility] (posterior pelvic tilt) Postural Control Postural Control: Within Functional Limits  Balance Balance Balance Assessed: Yes Static Sitting Balance Static Sitting - Balance Support: Feet supported Static Sitting - Level of Assistance: 6: Modified independent (Device/Increase time) Dynamic Sitting Balance Dynamic Sitting - Balance Support: Feet supported Dynamic Sitting - Level of Assistance: 5: Stand by assistance Static Standing  Balance Static Standing - Balance Support: During functional activity Static Standing - Level of Assistance: 4: Min assist Dynamic Standing Balance Dynamic Standing - Balance Support: During functional activity Dynamic Standing - Level of Assistance: 4: Min assist Extremity/Trunk Assessment RUE Assessment RUE Assessment: Within Functional Limits LUE Assessment LUE Assessment: Within Functional Limits  Care Tool Care Tool Self Care Eating   Eating Assist Level: Set up assist    Oral Care    Oral Care Assist Level: Set up assist    Bathing         Assist Level: Minimal Assistance - Patient > 75%    Upper Body Dressing(including orthotics)       Assist Level: Supervision/Verbal cueing    Lower Body Dressing (excluding footwear)   What is the patient wearing?: Underwear/pull up;Pants Assist for lower body dressing: Minimal Assistance - Patient > 75%    Putting on/Taking off footwear   What is the patient wearing?: Non-skid slipper socks Assist for footwear: Dependent - Patient 0%       Care Tool Toileting Toileting activity   Assist for toileting: Minimal Assistance - Patient > 75%     Care Tool Bed Mobility Roll left and right activity   Roll left and right assist level: Supervision/Verbal cueing    Sit to lying activity   Sit to lying assist level: Contact Guard/Touching assist    Lying to sitting on side of bed activity   Lying to sitting on side of bed assist level: the ability to move from lying on the back to sitting on the side of the bed with no back support.: Contact Guard/Touching assist     Care Tool Transfers Sit to stand transfer   Sit to stand assist level: Contact Guard/Touching assist    Chair/bed transfer   Chair/bed transfer assist level: Minimal Assistance - Patient > 75%     Toilet transfer   Assist Level: Minimal Assistance - Patient > 75%     Care Tool Cognition  Expression of Ideas and Wants Expression of Ideas and Wants: 3. Some  difficulty - exhibits some difficulty with expressing needs and ideas (e.g, some words or finishing thoughts) or speech is not clear  Understanding Verbal and Non-Verbal Content Understanding Verbal and Non-Verbal Content: 3. Usually understands - understands most conversations, but misses some part/intent of message. Requires cues at times to understand   Memory/Recall Ability Memory/Recall Ability : Current season;That he or she is in a hospital/hospital unit   Refer to Care Plan for Long Term Goals  SHORT TERM GOAL WEEK 1 OT Short Term Goal 1 (Week 1): STG=LTG d/t ELOS  Recommendations for other services: Therapeutic Recreation  Pet therapy   Skilled Therapeutic Intervention ADL ADL Eating: Set up Grooming: Supervision/safety Where Assessed-Grooming: Sitting at  sink Upper Body Bathing: Supervision/safety Where Assessed-Upper Body Bathing: Sitting at sink Lower Body Bathing: Minimal assistance Where Assessed-Lower Body Bathing: Sitting at sink Upper Body Dressing: Contact guard Where Assessed-Upper Body Dressing: Edge of bed Lower Body Dressing: Minimal assistance;Moderate assistance Where Assessed-Lower Body Dressing: Standing at sink;Sitting at sink Toileting: Minimal assistance Where Assessed-Toileting: Toilet;Bedside Commode Toilet Transfer: Minimal assistance Toilet Transfer Method: Stand pivot;Ambulating Acupuncturist: Bedside commode;Grab bars Tub/Shower Transfer: Unable to assess Tub/Shower Transfer Method: Unable to assess Film/video editor: Unable to assess Film/video editor Method: Unable to assess ADL Comments: VC required for safety furing ADL task d/t wanting to use RW and furniture walk, 1 instance of retropusion with standing Mobility  Bed Mobility Bed Mobility: Rolling Right;Rolling Left;Supine to Sit;Sit to Supine Rolling Right: Supervision/verbal cueing Rolling Left: Supervision/Verbal cueing Supine to Sit: Contact Guard/Touching  assist Sit to Supine: Contact Guard/Touching assist Transfers Sit to Stand: Contact Guard/Touching assist  Session 1 1:1 evaluation and treatment session initiated this date. OT roles, goals and purpose discussed with pt as well as therapy schedule. ADL completed this date with levels of assist listed above. OT reviewed back precautions with pt, pt only able to recall 1/3 with VC. Pt reported unable to read phone numbers/words and wife usually assists with that as well as taking pt to appointments. Pt would benefit from skilled OT in IPR setting in order to maximize independence with ADLs upon D/C.   Session 2 General: "I'm exhausted" Pt supine in bed upon OT arrival, agreeable to OT session.  Pain:  unrated pain reported in low back, activity, intermittent rest breaks, distractions provided for pain management, pt reports tolerable to proceed.   ADL: OT providing skilled intervention on transfer retraining in order to increase independence with tasks and increase activity tolerance. Pt completed the following tasks at the current level of assist: Bed mobility: Min A for RLE management OOB Transfers: CGA with RW fro various sit to stand and stand step transfers with RW, heavy VC for safety and RW management, noted decreased RLE step clearance   Exercises: Pt completed the following exercise circuit in order to improve functional activity, strength and endurance to prepare for ADLs such as bathing. Pt completed the following exercises in seated position with no noted LOB/SOB and 3x10 repetitions on each exercise: -seated marches with RLE -resistive knee curls with red theraband, VC for slow and steady for increased muscle endurance  Other: OT discussed DME with pt. Pt reports having canes and shower chair for tub/shower combo at home. OT mentioned BSC and need for RW.   After exercises, pt saying too fatigues to continue and requesting to go back to room. OT educated pt on importance of  safety and participation in therapy in order to D/C safely. Pt politely declined to finish session. Pt supine in bed with bed alarm activated, 2 bed rails up, call light within reach and 4Ps assessed.   Discharge Criteria: Patient will be discharged from OT if patient refuses treatment 3 consecutive times without medical reason, if treatment goals not met, if there is a change in medical status, if patient makes no progress towards goals or if patient is discharged from hospital.  The above assessment, treatment plan, treatment alternatives and goals were discussed and mutually agreed upon: by patient  Nila Barth, OTD, OTR/L 04/24/2024, 3:56 PM

## 2024-04-24 NOTE — Progress Notes (Addendum)
 Inpatient Rehabilitation Admission Medication Review by a Pharmacist  A complete drug regimen review was completed for this patient to identify any potential clinically significant medication issues.  High Risk Drug Classes Is patient taking? Indication by Medication  Antipsychotic No   Anticoagulant Yes Enoxaparin  - DVT prophylaxis  Antibiotic No   Opioid Yes Oxycodone - pain  Antiplatelet No   Hypoglycemics/insulin No   Vasoactive Medication Yes Amlodipine , Irbesartan - HTN  Chemotherapy No   Other Yes Acetaminophen - pain mangagement Gabapentin - neuropathic pain Protonix - GERD Rosuvastatin - HLD Sertraline -mood/behavior Methocarbamol - muscle spasms Albuterol  nebs- PRN shortness of breath, wheezing; COPD      Type of Medication Issue Identified Description of Issue Recommendation(s)  Drug Interaction(s) (clinically significant)     Duplicate Therapy     Allergy     No Medication Administration End Date     Incorrect Dose     Additional Drug Therapy Needed     Significant med changes from prior encounter (inform family/care partners about these prior to discharge).   PTA medications:   AZOR  (combination of amlodipine -olmesartan ). [facility] approved formulary substitution Irbesartan  150mg  for Olmesartan  20mg  here to be given with Amlodipine .   Esomeprazole  (Nexium ) PTA> substituted Pantoprazole  here.  Resume AZOR  on discharge.       Resume Esomeprazole  on discharge. Or if changed to other medication, communicate to patient /family/ caregiver prior to discharge   Other       Clinically significant medication issues were identified that warrant physician communication and completion of prescribed/recommended actions by midnight of the next day:  No  Name of provider notified for urgent issues identified: n/a  Provider Method of Notification: n/a  Pharmacist comments: n/a  Time spent performing this drug regimen review (minutes):  30    Alisa Irish, [place] Clinical  Pharmacist 04/24/2024 1:02 PM

## 2024-04-24 NOTE — Progress Notes (Signed)
 PROGRESS NOTE   Subjective/Complaints:  Pt reports his wife will be upset about missing flowers? Cannot focus on issues that affect him being here, medical issues.  Says pooping a lot- but is new- needs ot have another BM. So far has had 4 liquid Bms- however 3 of them large- all Type 7.  However, prior to that, no BM since has been in hospital  on 04/13/24- so due ot bowel meds   RLE is hurting, but not his back.   ROS:  Pt denies SOB, abd pain, CP, N/V/C/ (+)D, and vision changes Diarrhea is due to bowel meds (+)   Objective:   No results found. Recent Labs    04/23/24 1845 04/24/24 0532  WBC 11.0* 9.2  HGB 10.0* 9.4*  HCT 29.2* 27.2*  PLT 519* 477*   Recent Labs    04/23/24 1845 04/24/24 0532  NA  --  128*  K  --  4.4  CL  --  95*  CO2  --  25  GLUCOSE  --  108*  BUN  --  13  CREATININE 0.64 0.63  CALCIUM   --  8.9    Intake/Output Summary (Last 24 hours) at 04/24/2024 7829 Last data filed at 04/24/2024 0746 Gross per 24 hour  Intake 120 ml  Output 200 ml  Net -80 ml        Physical Exam: Vital Signs Blood pressure 123/75, pulse 68, temperature 98 F (36.7 C), resp. rate 17, height 6' (1.829 m), weight 92.4 kg, SpO2 99%.   General: awake, alert, appropriate, sitting up in bed; asking to have another BM; Nursing coordinator in room;  NAD HENT: conjugate gaze; oropharynx moist CV: regular rate and rhythm; no JVD Pulmonary: CTA B/L; no W/R/R- good air movement GI: soft, NT, ND, (+)BS- slightly hyperactive Psychiatric: tangential- hard to keep on topic Neurological: alert-  Skin_ IV in hand Neurologic: Cranial nerves II through XII intact, motor strength is 5/5 in bilateral deltoid, bicep, tricep, grip, left hip flexor, knee extensors, ankle dorsiflexor and plantar flexor Lower extremity to minus hip flexion 3 - knee extension 4/5 ankle dorsiflexion plantarflexion Sensory exam reduced sensation  right L3, right L4 dermatomal distribution Cerebellar exam normal finger to nose to finger  Musculoskeletal: Full range of motion upper extremities.  Lower extremity weakness limits active range of motion but is able to go passively.  No joint swelling    Assessment/Plan: 1. Functional deficits which require 3+ hours per day of interdisciplinary therapy in a comprehensive inpatient rehab setting. Physiatrist is providing close team supervision and 24 hour management of active medical problems listed below. Physiatrist and rehab team continue to assess barriers to discharge/monitor patient progress toward functional and medical goals  Care Tool:  Bathing              Bathing assist       Upper Body Dressing/Undressing Upper body dressing        Upper body assist      Lower Body Dressing/Undressing Lower body dressing            Lower body assist       Toileting Toileting    Toileting  assist Assist for toileting: Moderate Assistance - Patient 50 - 74%     Transfers Chair/bed transfer  Transfers assist     Chair/bed transfer assist level: Moderate Assistance - Patient 50 - 74%     Locomotion Ambulation   Ambulation assist              Walk 10 feet activity   Assist           Walk 50 feet activity   Assist           Walk 150 feet activity   Assist           Walk 10 feet on uneven surface  activity   Assist           Wheelchair     Assist               Wheelchair 50 feet with 2 turns activity    Assist            Wheelchair 150 feet activity     Assist          Blood pressure 123/75, pulse 68, temperature 98 F (36.7 C), resp. rate 17, height 6' (1.829 m), weight 92.4 kg, SpO2 99%.    Medical Problem List and Plan: 1. Functional deficits secondary to lumbar spondylosis/stenosis/radiculopathy status post L4-5, L5-S1 ALIF, L1-2, L3-4 DLIF, T10 to ilium posterior instrumented fusion  04/11/2024 and second stage 04/13/2024.  Back brace when out of bed applied in sitting position.             -patient may  shower             -ELOS/Goals: 7 to 10 days, supervision goals, patient states that someone told him he would go home on Friday but we discussed that discharge date would not be set until patient evaluated at rehab  First day of evaluations- con't CIR PT and OT  Team conference today to hopefully determine length of stay  D/c IV 2.  Antithrombotics: -DVT/anticoagulation:  Pharmaceutical: Lovenox .  Check vascular study             -antiplatelet therapy: N/A 3. Pain Management: Neurontin  400 mg 3 times daily, Robaxin  and oxycodone  as needed Has been taking oxycodone  40 to 60 mg/day postoperatively.  Premorbidly he was on chronic hydrocodone .  5/13- Oxy q3 hours as needed- will not send on q3- not more than q4-6 at d/c.  4. Mood/Behavior/Sleep: Provide emotional support.  Resume Zoloft  100 mg daily             -antipsychotic agents: N/A 5. Neuropsych/cognition: This patient is capable of making decisions on his own behalf. 6. Skin/Wound Care: Routine skin checks  5/13- incision C/D/I 7. Fluids/Electrolytes/Nutrition: Routine in and outs with follow-up chemistries 8.  Acute blood loss anemia.  Follow-up CBC  5/13- Hb 9.4- down from 10- but prior was 9.2-will monitor 9.  Hypertension.  Norvasc  10 mg daily, Avapro  150 mg daily.  Monitor with increased mobility  5/13- BP controlled so far- con't regimen 10 hyperlipidemia.  Crestor  11.  COPD/tobacco use.  Continue nebulizer as needed.  Provide counseling regarding cessation of tobacco 12.  History of alcohol use disorder.  Provide counseling 13.  GERD.  Protonix   14. Hyponatremia  5/13- Na 128 this AM down from 134- will recheck in AM since dropped so abruptly, might be off- and if worse, then will evaluate more /treat.    I spent a total of 41  minutes on total care today- >50% coordination of care- due to  D/w nursing  coordinator and nursing- also with pt about plan- reviewed chart, labs, vitals- and ordered labs for tomorrow- also team conference today to determine length of stay  LOS: 1 days A FACE TO FACE EVALUATION WAS PERFORMED  Kathryn Cosby 04/24/2024, 9:37 AM

## 2024-04-24 NOTE — Progress Notes (Signed)
 Inpatient Rehabilitation Center Individual Statement of Services  Patient Name:  Winner Courtwright [facility]  Date:  04/24/2024  Welcome to the Inpatient Rehabilitation Center.  Our goal is to provide you with an individualized program based on your diagnosis and situation, designed to meet your specific needs.  With this comprehensive rehabilitation program, you will be expected to participate in at least 3 hours of rehabilitation therapies Monday-Friday, with modified therapy programming on the weekends.  Your rehabilitation program will include the following services:  Physical Therapy (PT), Occupational Therapy (OT), 24 hour per day rehabilitation nursing, Therapeutic Recreaction (TR), Neuropsychology, Care Coordinator, Rehabilitation Medicine, Nutrition Services, and Pharmacy Services  Weekly team conferences will be held on Tuesday to discuss your progress.  Your Inpatient Rehabilitation Care Coordinator will talk with you frequently to get your input and to update you on team discussions.  Team conferences with you and your family in attendance may also be held.  Expected length of stay: 7-10 days  Overall anticipated outcome: supervision level  Depending on your progress and recovery, your program may change. Your Inpatient Rehabilitation Care Coordinator will coordinate services and will keep you informed of any changes. Your Inpatient Rehabilitation Care Coordinator's name and contact numbers are listed  below.  The following services may also be recommended but are not provided by the Inpatient Rehabilitation Center:   Home Health Rehabiltiation Services Outpatient Rehabilitation Services    Arrangements will be made to provide these services after discharge if needed.  Arrangements include referral to agencies that provide these services.  Your insurance has been verified to be:  [facility] Medicare Your primary doctor is:  Alline Areas  Pertinent information will be shared with your doctor  and your insurance company.  Inpatient Rehabilitation Care Coordinator:  Adrianna Albee, [place] [phone] or Justine Oms  Information discussed with and copy given to patient by: Mardell Shade, 04/24/2024, 9:33 AM

## 2024-04-24 NOTE — Progress Notes (Signed)
 Met with patient to review current situation, team conference and plan of care. Reviewed medications, incision care, use of brace when OOB, pain, sleep, bowel and bladder management. Continue to follow along to provide educational needs to facilitate preparation for discharge.

## 2024-04-24 NOTE — Progress Notes (Signed)
 Patient ID: Bradley Fisher, male   DOB: 08/17/1966, 58 y.o.   MRN: [id number]  Met wit pt to give team conference update regarding supervision level goals and discharge 5/19. Have made several attempts to call wife but no voice mail set up and pt reports she likes talking on the phone.

## 2024-04-25 ENCOUNTER — Ambulatory Visit: Payer: Self-pay | Admitting: Pulmonary Disease

## 2024-04-25 DIAGNOSIS — M5416 Radiculopathy, lumbar region: Secondary | ICD-10-CM | POA: Diagnosis not present

## 2024-04-25 LAB — BASIC METABOLIC PANEL WITH GFR
Anion gap: 11 (ref 5–15)
BUN: 13 mg/dL (ref 6–20)
CO2: 23 mmol/L (ref 22–32)
Calcium: 8.9 mg/dL (ref 8.9–10.3)
Chloride: 94 mmol/L — ABNORMAL LOW (ref 98–111)
Creatinine, Ser: 0.61 mg/dL (ref 0.61–1.24)
GFR, Estimated: >60 mL/min (ref >60)
Glucose, Bld: 106 mg/dL — ABNORMAL HIGH (ref 70–99)
Potassium: 4.4 mmol/L (ref 3.5–5.1)
Sodium: 128 mmol/L — ABNORMAL LOW (ref 135–145)

## 2024-04-25 NOTE — Patient Care Conference (Signed)
 Inpatient RehabilitationTeam Conference and Plan of Care Update Date: 04/24/2024   Time: 1138 am    Patient Name: Bradley Fisher [facility]      Medical Record Number: [id number]  Date of Birth: 1966/08/06 Sex: Male         Room/Bed: 4M04C/4M04C-01 Payor Info: Payor: [employer] MEDICARE / Plan: Sandford Croon DUAL COMPLETE / Product Type: *No Product type* /    Admit Date/Time:  04/23/2024  2:55 PM  Primary Diagnosis:  Lumbar radiculopathy  Hospital Problems: Principal Problem:   Lumbar radiculopathy    Expected Discharge Date: Expected Discharge Date: 04/30/24  Team Members Present: Physician leading conference: Dr. Celia Coles Social Worker Present: Adrianna Albee, LCSW Nurse Present: Jerene Monks, RN PT Present: Aundria Leech, PT OT Present: Nila Barth, OT     Current Status/Progress Goal Weekly Team Focus  Bowel/Bladder   Pt is continent of bowel/bladder   Pt will remain continent of bowel/bladder   Will assess qshift and PRN    Swallow/Nutrition/ Hydration               ADL's   Mod-Min A LB ADLs/toileting/transfers, SBA UB ADLs, posterior lean in standing, impulsive with transfers   SBA overall   safety awareness, transfers, global strength    Mobility   CGA for transfers and gait, impulsive with transfers, limited participation d/t pain   supervision overall  pain, RLE NMR    Communication                Safety/Cognition/ Behavioral Observations               Pain   Pt complains of back pain   Pt's pain is controlled with medication and repositioning   Will assess qshift and PRN    Skin   Pt has incisions to the abdomen, right flank and back   Pt's incisions will heal  Will assess qshift and PRN      Discharge Planning:  New evaluaiton according to acute chart home with wife who can provide 24/7 assist. Will confirm today    Team Discussion: Patient admitted post L4-5, L5-S1 ALIF, L1-2, L3-4 DLIF, T10 to ilium posterior  instrumented fusion due to lumbar spondylosis/stenosis/radiculopathy. Patient limited by pain, hyponatremia, impulsitivity, self limiting behaviors and fatigue.  Patient on target to meet rehab goals: yes, Patient requires mod--min assistance with ADLs and transfers. Patient requires CGA with ambulation needing verbal cues. Overall goals at discharge are set for supervision assistance.  *See Care Plan and progress notes for long and short-term goals.   Revisions to Treatment Plan:  Back Brace  Teaching Needs: Safety, medications, transfers, toileting, Back brace when OOB, incision care, medications, etc.   Current Barriers to Discharge: Decreased caregiver support  Possible Resolutions to Barriers: Family Education     Medical Summary Current Status: remote hx of Seizures- not on meds- contient of B/B- incision looks good- Na 128  Barriers to Discharge: Medical stability;Weight bearing restrictions;Uncontrolled Pain;Behavior/Mood;Complicated Wound;Self-care education;Electrolyte abnormality;Hypotension  Barriers to Discharge Comments: limited by: hyponatremia;  mid-mod A ADLs- supervision with PT; cannot read; impulsive/tangential/chronic;  self limiting Possible Resolutions to [employer] Focus: hypotensive vs self limiting; pt insistent that leaves Friday- d/c 5/19   Continued Need for Acute Rehabilitation Level of Care: The patient requires daily medical management by a physician with specialized training in physical medicine and rehabilitation for the following reasons: Direction of a multidisciplinary physical rehabilitation program to maximize functional independence : Yes Medical management of patient stability for increased activity  during participation in an intensive rehabilitation regime.: Yes Analysis of laboratory values and/or radiology reports with any subsequent need for medication adjustment and/or medical intervention. : Yes   I attest that I was present, lead  the team conference, and concur with the assessment and plan of the team.   Jameir Ake Gayo 04/24/2024, 1138 am

## 2024-04-25 NOTE — Progress Notes (Signed)
 Occupational Therapy Session Note  Patient Details  Name: Bradley Fisher MRN: [id number] Date of Birth: [birth year]  Today's Date: 04/25/2024 OT Individual Time: 1100-1200 OT Individual Time Calculation (min): 60 min    Short Term Goals: Week 1:  OT Short Term Goal 1 (Week 1): STG=LTG d/t ELOS  Skilled Therapeutic Interventions/Progress Updates:      Therapy Documentation Precautions:  Precautions Precautions: Back, Fall Recall of Precautions/Restrictions: Impaired Precaution/Restrictions Comments: recalled 3/3 without cue after reviewing with OT earlier in the am Required Braces or Orthoses: Spinal Brace Spinal Brace: Lumbar corset, Applied in sitting position Restrictions Weight Bearing Restrictions Per Provider Order: No Other Position/Activity Restrictions: +orthostatics General: "This is more work than I do at home" Pt supine in bed upon OT arrival, agreeable to OT session if bed level. Bathing tasks declined this date.   Pain:  4/10 pain reported in low back, activity, intermittent rest breaks, distractions provided for pain management, pt reports tolerable to proceed.   Exercises: Pt completed the following exercise circuit in order to improve functional activity, strength and endurance to prepare for ADLs such as bathing. Pt completed the following exercises in supine position with red theraband with no noted LOB/SOB and 3x10 repetitions on each exercise: -bicep curls -triceps extensions -shoulder abduction -forward punches -External rotation -shoulder flexion  Pt with PRN rest breaks throughout exercises with VC required for direction.    Other Treatments: OT assessing orthostatic vitals d/t pt not wanting to wear TED hose and soft BP. Vitals assessed as follows: Supine:104/73 EOB: 117/75 Standing:78/56 Seated EOB after standing: 102/84 Pt asymptomatic during OH episode. Only dizziness was with initial supine>EOB although resolved >1 min.    Pt supine in  bed with bed alarm activated, 2 bed rails up, call light within reach and 4Ps assessed.   Therapy/Group: Individual Therapy  Nila Barth, OTD, OTR/L 04/25/2024, 12:27 PM

## 2024-04-25 NOTE — Progress Notes (Signed)
 Physical Therapy Session Note  Patient Details  Name: Bradley Fisher MRN: [id number] Date of Birth: 09-29-1966  Today's Date: 04/25/2024 PT Individual Time: 0805-0900 and 1400-1455 PT Individual Time Calculation (min): 55 min and 55 min  Short Term Goals: Week 1:  PT Short Term Goal 1 (Week 1): =LTGs d/t ELOS  Skilled Therapeutic Interventions/Progress Updates: Pt presented in bed agreeable to therapy. Pt initially denies pain, states increased pain with mobility particularly incisional area. Nsg notified and pain meds provided during session. Pt completed supine to sit with supervision and use of bed features while maintaining spinal precautions. PTA donned TED hose total A and placed LSO around pt total A while pt was able to fasten and tighten independently. Pt then expressed need for bathroom therefore pt completed Sit to stand with CGA and ambulated to toilet (BSC placed over toilet for height). Pt able to complete clothing management with CGA and toilet transfers with CGA. Pt unable to void/BM therefore stood from toilet in same manner as prior and ambulated to w/c. After receiving am/pain meds, pt transported to main gym for energy conservation. Pt c/o intermittently throughout session of dislike to TED hose as well as discomfort of LSO. This therapist explained LSO ordered by surgeon as well as TED hose 2/2 OH during eval. Redirection providing throughout session when this subject brought up. Pt completed ambulatory transfer to high/low mat with CGA. Participated in Sit to stand with LLE on 2in block for increased RLE ms recruitment. Pt able to consistently perform with CGA. As pt went to return step pt with episode of RLE ms spasm  with pt causing ?LOB while in sitting as PTA noted pt leaning on mat. Pt explained had not happened before and noted not to occur again during session. Pt then participated in seated LAQ with 4lb weight non LLE and 2lb weight on RLE 2 x 10. No spasms noted with  activity. Completed ambulatory transfer back to w/c with CGA. Pt then transported to [facility] nsg station and pt ambulated back to room with CGA and noted mild ataxia, pt also noted to have absent knee flexion during step through and land with RLE foot flat. Upon entry in room pt transferred to bed and doffed LSO with supervision, Pt completed sit to supine with use of bed rail. Pt repositioned to comfort and left with bed alarm on, call bell within reach and needs met.   Tx2: Pt presented in bed agreeable to therapy with encouragement. Pt states recently received pain meds. PTA donned TED hose total A in supine. Pt completed bed mobility with supervision and use of bed rail. Pt indicating need for BM. Pt performed Sit to stand with CGA and ambulated to bathroom and completed toilet transfers and clothing management with CGA. Pt unable to have B/B void. Pt able to stand and complete clothing management with CGA. Pt ambulated to w/c with CGA and RW. Pt transported to main gym and PTA set up RLE with green theraband to encourage knee/hip flexion. Pt then ambulated ~63ft with RW and CGA with pt demonstrating more of a "march" and landing foot flat. Pt instructed to decrease knee flexion and attempt to place foot on ground heel first. Pt was able to improve heel strike but demonstrated difficulty controlling knee flexion. Upon sitting pt expressed fatigue and lightheadedness. Vitals checked SpO2 98%, BP 105/58 (68) HR 87. Due to diastolic lower than normal trend deferred additional ambulation. Pt taken to day room and participated in [employer] [place]  50cm/sec x 3 1 min bouts. Pt then indicated need for BM. Pt propelled partial distance to elevators ~126ft and transported remaining distance. In room pt then stating urgency resolved. Pt completed ambulatory transfer to bed and doffed LSO with supervision. Pt completed sit to supine with supervision and PTA doffed TED hose. Pt left in bed at end of session with bed alarm on,  call bell within reach and needs met.      Therapy Documentation Precautions:  Precautions Precautions: Back, Fall Recall of Precautions/Restrictions: Impaired Precaution/Restrictions Comments: recalled 3/3 without cue after reviewing with OT earlier in the am Required Braces or Orthoses: Spinal Brace Spinal Brace: Lumbar corset, Applied in sitting position Restrictions Weight Bearing Restrictions Per Provider Order: No Other Position/Activity Restrictions: +orthostatics General:   Vital Signs: Therapy Vitals Temp: 98.4 F (36.9 C) Temp Source: Oral Pulse Rate: 60 Resp: 16 BP: 124/68 Patient Position (if appropriate): Lying Oxygen Therapy SpO2: 99 % O2 Device: Room Air Pain: Pain Assessment Pain Scale: 0-10 Pain Score: 0-No pain   Therapy/Group: Individual Therapy  Angelyne Terwilliger 04/25/2024, 3:41 PM

## 2024-04-25 NOTE — Progress Notes (Signed)
 PROGRESS NOTE   Subjective/Complaints:  Pt reports his wife will be upset about Pt reports didn't get TEDs off til 10pm- was burning when not removed.   Pain overall is controlled/"all right"- LBM yesterday.   ROS:   Pt denies SOB, abd pain, CP, N/V/C/D, and vision changes   Objective:   VAS US  LOWER EXTREMITY VENOUS (DVT) Result Date: 04/24/2024  Lower Venous DVT Study Patient Name:  Bradley Fisher  Date of Exam:   04/24/2024 Medical Rec #: [id number]         Accession #:    [id number] Date of Birth: 1966/09/08        Patient Gender: M Patient Age:   58 years Exam Location:  [facility] Procedure:      VAS US  LOWER EXTREMITY VENOUS (DVT) Referring Phys: Georjean Kite --------------------------------------------------------------------------------  Indications: Swelling.  Risk Factors: None identified. Comparison Study: No prior studies. Performing Technologist: Lerry Ransom RVT  Examination Guidelines: A complete evaluation includes B-mode imaging, spectral Doppler, color Doppler, and power Doppler as needed of all accessible portions of each vessel. Bilateral testing is considered an integral part of a complete examination. Limited examinations for reoccurring indications may be performed as noted. The reflux portion of the exam is performed with the patient in reverse Trendelenburg.  +---------+---------------+---------+-----------+----------+--------------+ RIGHT    CompressibilityPhasicitySpontaneityPropertiesThrombus Aging +---------+---------------+---------+-----------+----------+--------------+ CFV      Full           Yes      Yes                                 +---------+---------------+---------+-----------+----------+--------------+ SFJ      Full                                                        +---------+---------------+---------+-----------+----------+--------------+ FV Prox  Full                                                         +---------+---------------+---------+-----------+----------+--------------+ FV Mid   Full                                                        +---------+---------------+---------+-----------+----------+--------------+ FV DistalFull                                                        +---------+---------------+---------+-----------+----------+--------------+ PFV      Full                                                        +---------+---------------+---------+-----------+----------+--------------+  POP      Full           Yes      Yes                                 +---------+---------------+---------+-----------+----------+--------------+ PTV      Full                                                        +---------+---------------+---------+-----------+----------+--------------+ PERO     Full                                                        +---------+---------------+---------+-----------+----------+--------------+   +---------+---------------+---------+-----------+----------+--------------+ LEFT     CompressibilityPhasicitySpontaneityPropertiesThrombus Aging +---------+---------------+---------+-----------+----------+--------------+ CFV      Full           Yes      Yes                                 +---------+---------------+---------+-----------+----------+--------------+ SFJ      Full                                                        +---------+---------------+---------+-----------+----------+--------------+ FV Prox  Full                                                        +---------+---------------+---------+-----------+----------+--------------+ FV Mid   Full                                                        +---------+---------------+---------+-----------+----------+--------------+ FV DistalFull                                                         +---------+---------------+---------+-----------+----------+--------------+ PFV      Full                                                        +---------+---------------+---------+-----------+----------+--------------+ POP      Full           Yes      Yes                                 +---------+---------------+---------+-----------+----------+--------------+  PTV      Full                                                        +---------+---------------+---------+-----------+----------+--------------+ PERO     Full                                                        +---------+---------------+---------+-----------+----------+--------------+     Summary: RIGHT: - There is no evidence of deep vein thrombosis in the lower extremity.  - No cystic structure found in the popliteal fossa.  LEFT: - There is no evidence of deep vein thrombosis in the lower extremity.  - No cystic structure found in the popliteal fossa.  *See table(s) above for measurements and observations. Electronically signed by Delaney Fearing on 04/24/2024 at 4:23:45 PM.    Final    Recent Labs    04/23/24 1845 04/24/24 0532  WBC 11.0* 9.2  HGB 10.0* 9.4*  HCT 29.2* 27.2*  PLT 519* 477*   Recent Labs    04/24/24 0532 04/25/24 0519  NA 128* 128*  K 4.4 4.4  CL 95* 94*  CO2 25 23  GLUCOSE 108* 106*  BUN 13 13  CREATININE 0.63 0.61  CALCIUM  8.9 8.9    Intake/Output Summary (Last 24 hours) at 04/25/2024 0826 Last data filed at 04/25/2024 0739 Gross per 24 hour  Intake 700 ml  Output 550 ml  Net 150 ml        Physical Exam: Vital Signs Blood pressure 130/68, pulse 77, temperature 98 F (36.7 C), temperature source Oral, resp. rate 18, height 6' (1.829 m), weight 92.4 kg, SpO2 99%.     General: awake, alert, appropriate,  sitting up in bed;  ate 50% tray; NAD HENT: conjugate gaze; oropharynx moist CV: regular rate and rhythm; no JVD Pulmonary: CTA B/L; no W/R/R- good air  movement GI: soft, NT, ND, (+)BS- less hyperactive Psychiatric: appropriate but perseverating on TED's Neurological: alert- tangential  Neurologic: Cranial nerves II through XII intact, motor strength is 5/5 in bilateral deltoid, bicep, tricep, grip, left hip flexor, knee extensors, ankle dorsiflexor and plantar flexor Lower extremity to minus hip flexion 3 - knee extension 4/5 ankle dorsiflexion plantarflexion Sensory exam reduced sensation right L3, right L4 dermatomal distribution Cerebellar exam normal finger to nose to finger  Musculoskeletal: Full range of motion upper extremities.  Lower extremity weakness limits active range of motion but is able to go passively.  No joint swelling    Assessment/Plan: 1. Functional deficits which require 3+ hours per day of interdisciplinary therapy in a comprehensive inpatient rehab setting. Physiatrist is providing close team supervision and 24 hour management of active medical problems listed below. Physiatrist and rehab team continue to assess barriers to discharge/monitor patient progress toward functional and medical goals  Care Tool:  Bathing              Bathing assist Assist Level: Minimal Assistance - Patient > 75%     Upper Body Dressing/Undressing Upper body dressing        Upper body assist Assist Level: Supervision/Verbal cueing    Lower Body Dressing/Undressing Lower body dressing  What is the patient wearing?: Underwear/pull up, Pants     Lower body assist Assist for lower body dressing: Minimal Assistance - Patient > 75%     Toileting Toileting    Toileting assist Assist for toileting: Minimal Assistance - Patient > 75%     Transfers Chair/bed transfer  Transfers assist     Chair/bed transfer assist level: Minimal Assistance - Patient > 75%     Locomotion Ambulation   Ambulation assist      Assist level: Minimal Assistance - Patient > 75% Assistive device: Walker-rolling Max distance:  15   Walk 10 feet activity   Assist     Assist level: Minimal Assistance - Patient > 75% Assistive device: Walker-rolling   Walk 50 feet activity   Assist Walk 50 feet with 2 turns activity did not occur: Safety/medical concerns         Walk 150 feet activity   Assist Walk 150 feet activity did not occur: Safety/medical concerns         Walk 10 feet on uneven surface  activity   Assist Walk 10 feet on uneven surfaces activity did not occur: Safety/medical concerns         Wheelchair     Assist Is the patient using a wheelchair?: Yes Type of Wheelchair: Manual    Wheelchair assist level: Supervision/Verbal cueing Max wheelchair distance: 5    Wheelchair 50 feet with 2 turns activity    Assist    Wheelchair 50 feet with 2 turns activity did not occur: Refused       Wheelchair 150 feet activity     Assist  Wheelchair 150 feet activity did not occur: Refused       Blood pressure 130/68, pulse 77, temperature 98 F (36.7 C), temperature source Oral, resp. rate 18, height 6' (1.829 m), weight 92.4 kg, SpO2 99%.    Medical Problem List and Plan: 1. Functional deficits secondary to lumbar spondylosis/stenosis/radiculopathy status post L4-5, L5-S1 ALIF, L1-2, L3-4 DLIF, T10 to ilium posterior instrumented fusion 04/11/2024 and second stage 04/13/2024.  Back brace when out of bed applied in sitting position.             -patient may  shower             -ELOS/Goals: 7 to 10 days, supervision goals, patient states that someone told him he would go home on Friday but we discussed that discharge date would not be set until patient evaluated at rehab  5/19 d/c  Con't CIR PT and OT 2.  Antithrombotics: -DVT/anticoagulation:  Pharmaceutical: Lovenox .  Check vascular study 5/14- Dopplers (-)             -antiplatelet therapy: N/A 3. Pain Management: Neurontin  400 mg 3 times daily, Robaxin  and oxycodone  as needed Has been taking oxycodone  40 to 60  mg/day postoperatively.  Premorbidly he was on chronic hydrocodone .  5/13- Oxy q3 hours as needed- will not send on q3- not more than q4-6 at d/c. 5/14- taking ~ q4 hours?  4. Mood/Behavior/Sleep: Provide emotional support.  Resume Zoloft  100 mg daily             -antipsychotic agents: N/A 5. Neuropsych/cognition: This patient is capable of making decisions on his own behalf. 6. Skin/Wound Care: Routine skin checks  5/13- incision C/D/I 7. Fluids/Electrolytes/Nutrition: Routine in and outs with follow-up chemistries 8.  Acute blood loss anemia.  Follow-up CBC  5/13- Hb 9.4- down from 10- but prior was 9.2-will monitor 9.  Hypertension.  Norvasc  10 mg daily, Avapro  150 mg daily.  Monitor with increased mobility  5/13- 5/14- BP controlled so far- con't regimen 10 hyperlipidemia.  Crestor  11.  COPD/tobacco use.  Continue nebulizer as needed.  Provide counseling regarding cessation of tobacco 12.  History of alcohol use disorder.  Provide counseling 13.  GERD.  Protonix   14. Hyponatremia  5/13- Na 128 this AM down from 134- will recheck in AM since dropped so abruptly, might be off- and if worse, then will evaluate more /treat.   5/14- they just restarted his Sertraline  100 mg daily- that's probably what dropped his Na- which has been OK in hospital, likely due to not being on Zoloft  in hospital until now- don't see any other reason for drop in Na- will check in AM- if drops more, will treat/stop Zoloft .     I spent a total of 36   minutes on total care today- >50% coordination of care- due to  D/w PA about pt's low Na- went over chart to see that Sertraline  just restarted- also reviewed chart, meds, and vitals/labs  LOS: 2 days A FACE TO FACE EVALUATION WAS PERFORMED  Bradley Fisher 04/25/2024, 8:26 AM

## 2024-04-26 ENCOUNTER — Ambulatory Visit: Admitting: Pulmonary Disease

## 2024-04-26 DIAGNOSIS — M5416 Radiculopathy, lumbar region: Secondary | ICD-10-CM | POA: Diagnosis not present

## 2024-04-26 LAB — BASIC METABOLIC PANEL WITH GFR
Anion gap: 9 (ref 5–15)
BUN: 12 mg/dL (ref 6–20)
CO2: 26 mmol/L (ref 22–32)
Calcium: 9.3 mg/dL (ref 8.9–10.3)
Chloride: 95 mmol/L — ABNORMAL LOW (ref 98–111)
Creatinine, Ser: 0.65 mg/dL (ref 0.61–1.24)
GFR, Estimated: >60 mL/min (ref >60)
Glucose, Bld: 101 mg/dL — ABNORMAL HIGH (ref 70–99)
Potassium: 4.2 mmol/L (ref 3.5–5.1)
Sodium: 130 mmol/L — ABNORMAL LOW (ref 135–145)

## 2024-04-26 MED ORDER — POLYETHYLENE GLYCOL 3350 17 G PO PACK
17.0000 g | PACK | Freq: Every day | ORAL | Status: DC
  Administered 2024-04-26 – 2024-04-30 (×4): 17 g via ORAL
  Filled 2024-04-26 (×3): qty 1

## 2024-04-26 MED ORDER — OXYCODONE HCL 5 MG PO TABS
10.0000 mg | ORAL_TABLET | ORAL | Status: DC | PRN
  Administered 2024-04-26 – 2024-04-28 (×7): 10 mg via ORAL
  Filled 2024-04-26 (×7): qty 2

## 2024-04-26 MED ORDER — SENNA 8.6 MG PO TABS
2.0000 | ORAL_TABLET | Freq: Every day | ORAL | Status: DC
  Administered 2024-04-26 – 2024-04-28 (×3): 17.2 mg via ORAL
  Filled 2024-04-26 (×4): qty 2

## 2024-04-26 MED ORDER — OXYCODONE HCL 5 MG PO TABS
5.0000 mg | ORAL_TABLET | ORAL | Status: DC | PRN
  Administered 2024-04-27: 5 mg via ORAL
  Filled 2024-04-26: qty 1

## 2024-04-26 NOTE — Progress Notes (Signed)
 Patient ID: ACEION VAAL, male   DOB: 1966-10-19, 58 y.o.   MRN: [id number]  Met with pt and spoke with wife via telephone when pt called her to give team conference update and discharge date Monday 5/19. Discussed equipment and follow up,both in agreement. Have made referral to [facility] for rolling walker and 3 in 1 and [facility] was following while on acute have reached out and they can take his referral.

## 2024-04-26 NOTE — Plan of Care (Signed)
  Problem: Consults Goal: RH SPINAL CORD INJURY PATIENT EDUCATION Description:  See Patient Education module for education specifics.  Outcome: Progressing   Problem: SCI BOWEL ELIMINATION Goal: RH STG MANAGE BOWEL WITH ASSISTANCE Description: STG Manage Bowel with supervision Assistance. Outcome: Progressing   Problem: SCI BLADDER ELIMINATION Goal: RH STG MANAGE BLADDER WITH ASSISTANCE Description: STG Manage Bladder With  supervision Assistance Outcome: Progressing   Problem: RH SKIN INTEGRITY Goal: RH STG SKIN FREE OF INFECTION/BREAKDOWN Description: Manage skin free of infection/breakdown with supervision Outcome: Progressing   Problem: RH SAFETY Goal: RH STG ADHERE TO SAFETY PRECAUTIONS W/ASSISTANCE/DEVICE Description: STG Adhere to Safety Precautions With supervision Assistance/Device. Outcome: Progressing   Problem: RH PAIN MANAGEMENT Goal: RH STG PAIN MANAGED AT OR BELOW PT'S PAIN GOAL Description: <4 w/ prns Outcome: Progressing   Problem: RH KNOWLEDGE DEFICIT SCI Goal: RH STG INCREASE KNOWLEDGE OF SELF CARE AFTER SCI Description: Manage increase knowledge of self care after sci with supervision from spouse using educational materials provided Outcome: Progressing

## 2024-04-26 NOTE — Progress Notes (Signed)
 PROGRESS NOTE   Subjective/Complaints:  Pt reports pain controlled, but per nursing taking frequently throughout the night.  But slept well- most of night.   Pt agrees.  Slept better.  "Pain pretty good" per pt.  LBM 2 days ago- hasn't gone since cleaned out with mg citrate.    ROS:   Pt denies SOB, abd pain, CP, N/V/ (+)C/D, and vision changes   Objective:   VAS US  LOWER EXTREMITY VENOUS (DVT) Result Date: 04/24/2024  Lower Venous DVT Study Patient Name:  Bradley Fisher  Date of Exam:   04/24/2024 Medical Rec #: [id number]         Accession #:    [id number] Date of Birth: July 09, 1966        Patient Gender: M Patient Age:   58 years Exam Location:  [facility] Procedure:      VAS US  LOWER EXTREMITY VENOUS (DVT) Referring Phys: Georjean Kite --------------------------------------------------------------------------------  Indications: Swelling.  Risk Factors: None identified. Comparison Study: No prior studies. Performing Technologist: Lerry Ransom RVT  Examination Guidelines: A complete evaluation includes B-mode imaging, spectral Doppler, color Doppler, and power Doppler as needed of all accessible portions of each vessel. Bilateral testing is considered an integral part of a complete examination. Limited examinations for reoccurring indications may be performed as noted. The reflux portion of the exam is performed with the patient in reverse Trendelenburg.  +---------+---------------+---------+-----------+----------+--------------+ RIGHT    CompressibilityPhasicitySpontaneityPropertiesThrombus Aging +---------+---------------+---------+-----------+----------+--------------+ CFV      Full           Yes      Yes                                 +---------+---------------+---------+-----------+----------+--------------+ SFJ      Full                                                         +---------+---------------+---------+-----------+----------+--------------+ FV Prox  Full                                                        +---------+---------------+---------+-----------+----------+--------------+ FV Mid   Full                                                        +---------+---------------+---------+-----------+----------+--------------+ FV DistalFull                                                        +---------+---------------+---------+-----------+----------+--------------+  PFV      Full                                                        +---------+---------------+---------+-----------+----------+--------------+ POP      Full           Yes      Yes                                 +---------+---------------+---------+-----------+----------+--------------+ PTV      Full                                                        +---------+---------------+---------+-----------+----------+--------------+ PERO     Full                                                        +---------+---------------+---------+-----------+----------+--------------+   +---------+---------------+---------+-----------+----------+--------------+ LEFT     CompressibilityPhasicitySpontaneityPropertiesThrombus Aging +---------+---------------+---------+-----------+----------+--------------+ CFV      Full           Yes      Yes                                 +---------+---------------+---------+-----------+----------+--------------+ SFJ      Full                                                        +---------+---------------+---------+-----------+----------+--------------+ FV Prox  Full                                                        +---------+---------------+---------+-----------+----------+--------------+ FV Mid   Full                                                         +---------+---------------+---------+-----------+----------+--------------+ FV DistalFull                                                        +---------+---------------+---------+-----------+----------+--------------+ PFV      Full                                                        +---------+---------------+---------+-----------+----------+--------------+  POP      Full           Yes      Yes                                 +---------+---------------+---------+-----------+----------+--------------+ PTV      Full                                                        +---------+---------------+---------+-----------+----------+--------------+ PERO     Full                                                        +---------+---------------+---------+-----------+----------+--------------+     Summary: RIGHT: - There is no evidence of deep vein thrombosis in the lower extremity.  - No cystic structure found in the popliteal fossa.  LEFT: - There is no evidence of deep vein thrombosis in the lower extremity.  - No cystic structure found in the popliteal fossa.  *See table(s) above for measurements and observations. Electronically signed by Delaney Fearing on 04/24/2024 at 4:23:45 PM.    Final    Recent Labs    04/23/24 1845 04/24/24 0532  WBC 11.0* 9.2  HGB 10.0* 9.4*  HCT 29.2* 27.2*  PLT 519* 477*   Recent Labs    04/25/24 0519 04/26/24 0543  NA 128* 130*  K 4.4 4.2  CL 94* 95*  CO2 23 26  GLUCOSE 106* 101*  BUN 13 12  CREATININE 0.61 0.65  CALCIUM  8.9 9.3    Intake/Output Summary (Last 24 hours) at 04/26/2024 0846 Last data filed at 04/26/2024 0717 Gross per 24 hour  Intake 420 ml  Output 925 ml  Net -505 ml        Physical Exam: Vital Signs Blood pressure 126/72, pulse 75, temperature 98 F (36.7 C), resp. rate 18, height 6' (1.829 m), weight 92.4 kg, SpO2 96%.     General: awake, alert, appropriate, sitting up in bed; NAD HENT: conjugate  gaze; oropharynx moist CV: regular rate and rhythm; no JVD Pulmonary: CTA B/L; no W/R/R- good air movement GI: soft, NT, ND, (+)BS- hypoactive Psychiatric: appropriate Neurological: - less tangential- but somewhat hard to understand this AM- mumbling Neurologic: Cranial nerves II through XII intact, motor strength is 5/5 in bilateral deltoid, bicep, tricep, grip, left hip flexor, knee extensors, ankle dorsiflexor and plantar flexor Lower extremity to minus hip flexion 3 - knee extension 4/5 ankle dorsiflexion plantarflexion Sensory exam reduced sensation right L3, right L4 dermatomal distribution Cerebellar exam normal finger to nose to finger  Musculoskeletal: Full range of motion upper extremities.  Lower extremity weakness limits active range of motion but is able to go passively.  No joint swelling    Assessment/Plan: 1. Functional deficits which require 3+ hours per day of interdisciplinary therapy in a comprehensive inpatient rehab setting. Physiatrist is providing close team supervision and 24 hour management of active medical problems listed below. Physiatrist and rehab team continue to assess barriers to discharge/monitor patient progress toward functional and medical goals  Care Tool:  Bathing  Bathing assist Assist Level: Minimal Assistance - Patient > 75%     Upper Body Dressing/Undressing Upper body dressing        Upper body assist Assist Level: Supervision/Verbal cueing    Lower Body Dressing/Undressing Lower body dressing      What is the patient wearing?: Underwear/pull up, Pants     Lower body assist Assist for lower body dressing: Minimal Assistance - Patient > 75%     Toileting Toileting    Toileting assist Assist for toileting: Minimal Assistance - Patient > 75%     Transfers Chair/bed transfer  Transfers assist     Chair/bed transfer assist level: Minimal Assistance - Patient > 75%      Locomotion Ambulation   Ambulation assist      Assist level: Minimal Assistance - Patient > 75% Assistive device: Walker-rolling Max distance: 15   Walk 10 feet activity   Assist     Assist level: Minimal Assistance - Patient > 75% Assistive device: Walker-rolling   Walk 50 feet activity   Assist Walk 50 feet with 2 turns activity did not occur: Safety/medical concerns         Walk 150 feet activity   Assist Walk 150 feet activity did not occur: Safety/medical concerns         Walk 10 feet on uneven surface  activity   Assist Walk 10 feet on uneven surfaces activity did not occur: Safety/medical concerns         Wheelchair     Assist Is the patient using a wheelchair?: Yes Type of Wheelchair: Manual    Wheelchair assist level: Supervision/Verbal cueing Max wheelchair distance: 5    Wheelchair 50 feet with 2 turns activity    Assist    Wheelchair 50 feet with 2 turns activity did not occur: Refused       Wheelchair 150 feet activity     Assist  Wheelchair 150 feet activity did not occur: Refused       Blood pressure 126/72, pulse 75, temperature 98 F (36.7 C), resp. rate 18, height 6' (1.829 m), weight 92.4 kg, SpO2 96%.    Medical Problem List and Plan: 1. Functional deficits secondary to lumbar spondylosis/stenosis/radiculopathy status post L4-5, L5-S1 ALIF, L1-2, L3-4 DLIF, T10 to ilium posterior instrumented fusion 04/11/2024 and second stage 04/13/2024.  Back brace when out of bed applied in sitting position.             -patient may  shower             -ELOS/Goals: 7 to 10 days, supervision goals, patient states that someone told him he would go home on Friday but we discussed that discharge date would not be set until patient evaluated at rehab  5/19 d/c  Con't CIR PT and OT- pt'w ife having stents placed 5/16- so waiting til next week to d/c.  -will d/c honeycomb on abd incision- leave open to air if possible 2.   Antithrombotics: -DVT/anticoagulation:  Pharmaceutical: Lovenox .  Check vascular study 5/14- Dopplers (-)             -antiplatelet therapy: N/A 3. Pain Management: Neurontin  400 mg 3 times daily, Robaxin  and oxycodone  as needed Has been taking oxycodone  40 to 60 mg/day postoperatively.  Premorbidly he was on chronic hydrocodone .  5/13- Oxy q3 hours as needed- will not send on q3- not more than q4-6 at d/c. 5/14- taking ~ q4 hours? 5/15- taking frequently- will need to decrease dose to q4  hours prn- done   4. Mood/Behavior/Sleep: Provide emotional support.  Resume Zoloft  100 mg daily             -antipsychotic agents: N/A 5. Neuropsych/cognition: This patient is capable of making decisions on his own behalf. 6. Skin/Wound Care: Routine skin checks  5/13- incision C/D/I 7. Fluids/Electrolytes/Nutrition: Routine in and outs with follow-up chemistries 8.  Acute blood loss anemia.  Follow-up CBC  5/13- Hb 9.4- down from 10- but prior was 9.2-will monitor 9.  Hypertension.  Norvasc  10 mg daily, Avapro  150 mg daily.  Monitor with increased mobility  5/13- 5/15- BP controlled so far- con't regimen 10 hyperlipidemia.  Crestor  11.  COPD/tobacco use.  Continue nebulizer as needed.  Provide counseling regarding cessation of tobacco 12.  History of alcohol use disorder.  Provide counseling 13.  GERD.  Protonix   14. Hyponatremia  5/13- Na 128 this AM down from 134- will recheck in AM since dropped so abruptly, might be off- and if worse, then will evaluate more /treat.   5/14- they just restarted his Sertraline  100 mg daily- that's probably what dropped his Na- which has been OK in hospital, likely due to not being on Zoloft  in hospital until now- don't see any other reason for drop in Na- will check in AM- if drops more, will treat/stop Zoloft .   5/15- Na 130 today- will wait on treating since doing better  15. Constipation  5/15- LBM 2 days ago- not on bowel meds- just prn- will start Senna 2 tabs  daily and Miralax  daily since on so much pain medicine- pt reluctant, but explained his pain meds constipate him and taking pretty much max dose.    I spent a total of 39   minutes on total care today- >50% coordination of care- due to  IPOC done; also d/w pt with nursing; as well as with nursing cooridnator- will also speak with charge nurse- they are checking BG's-  LOS: 3 days A FACE TO FACE EVALUATION WAS PERFORMED  Raphel Stickles 04/26/2024, 8:46 AM

## 2024-04-26 NOTE — Progress Notes (Signed)
 Occupational Therapy Session Note  Patient Details  Name: Bradley Fisher MRN: [id number] Date of Birth: 1966-04-27  Today's Date: 04/26/2024 OT Individual Time: 0930-1015 & 1335-1415 OT Individual Time Calculation (min): 45 min & 40   Short Term Goals: Week 1:  OT Short Term Goal 1 (Week 1): STG=LTG d/t ELOS  Skilled Therapeutic Interventions/Progress Updates:      Therapy Documentation Precautions:  Precautions Precautions: Back, Fall Recall of Precautions/Restrictions: Impaired Precaution/Restrictions Comments: recalled 3/3 without cue after reviewing with OT earlier in the am Required Braces or Orthoses: Spinal Brace Spinal Brace: Lumbar corset, Applied in sitting position Restrictions Weight Bearing Restrictions Per Provider Order: No Other Position/Activity Restrictions: +orthostatics Session 1 General: "Hi [name]" Pt supine in bed upon OT arrival, agreeable to OT session.  Vital Signs: pt reported feeling dizzy upon standing for first time, BP was 117/89, pt reported dizziness resolved, felt like potentially got up too fast  Pain: no pain reported  ADL: OT providing skilled intervention with functional mobility with RW from room><gym. Pt ambulating CGA with RW and frequent rest breaks.   Exercises: OT providing skilled intervention with 10 minutes of nu step bike in order to increase BUE/BLEstrength and endurance in preparation for increased independence in ADLs such as functional transfers. PRN rest breaks, on level 4 resistance. Pt was able to complete 493 steps and 0.3 mi.    Other Treatments: OT discussing DME needs with SW and pt. Pt will require RW, already has shower chair and will not required BSC.   Pt supine in bed with bed alarm activated, 2 bed rails up, call light within reach and 4Ps assessed.   Session 2 General: "I won't be moving this much at home" Pt supine in bed upon OT arrival, agreeable to OT session.  Pain: 10/10 pain reported in back,  nurse medicated pt during session, activity, intermittent rest breaks, distractions provided for pain management, pt reports tolerable to proceed.   ADL: OT providing skilled intervention on ADL retraining in order to increase independence with tasks and increase activity tolerance. Pt completed the following tasks at the current level of assist with LSO donned for all mobility: Bed mobility: SBA supine><EOB using UE to manage RLE Toilet transfer: CGA emerging SBA with RW and VC for safety for RW management,  Toileting: SBA, standing to manage pants over waist with BUE, intermittent unsupported standing at RW with no LOB, pt with accident with urine on floor, OT assisting with clean up and changing clothing into clean clothing UB dressing: SBA seated EOB doffing/donning overhead shirt, SBA to don/doff LSO  LB dressing: SBA with use of reacher to don over feet, standing at RW to manage over waist Footwear: total A to don/doff TED hose for BP management   Pt supine in bed with bed alarm activated, 2 bed rails up, call light within reach and 4Ps assessed. Wife present. OT discussed with wife questions/comments/concerns. Wife is pleased with pt's progress in therapy. OT re-iterated spinal precautions for safety and fall prevention strategies.     Therapy/Group: Individual Therapy  Nila Barth, OTD, OTR/L 04/26/2024, 2:22 PM

## 2024-04-26 NOTE — Progress Notes (Signed)
 Physical Therapy Session Note  Patient Details  Name: Bradley Fisher MRN: [id number] Date of Birth: 1966-12-07  Today's Date: 04/26/2024 PT Individual Time: 1045-1150 and 1450-1530 PT Individual Time Calculation (min): 65 min and 40 min  Short Term Goals: Week 1:  PT Short Term Goal 1 (Week 1): =LTGs d/t ELOS  Skilled Therapeutic Interventions/Progress Updates: Pt presented in bed agreeable to therapy. Pt states unrated pain in back and flank area, increased with use of LSO with rest and repositioning provided as needed. Session focused on functional mobility. Pt initially reluctant for OOB, provided continued education regarding importance of continued mobility for d/c home. Pt completed bed mobility with supervision, HOB slightly elevated, and use of bed rail. Per pt during session pt indicated personal bed has HOB elevating feature as well. Pt able to don brace with set up and maintain precautions. Pt requesting to use bathroom, completed Sit to stand with CGA and ambulated with CGA to bathroom with RW. Pt continues to ambulate with foot flat and decreased knee flexion. Toilet transfers and clothing management at [facility] (- urinary void/BM). Pt stood and ambulated to w/c in room with CGA overall. Pt transported to day room for energy conservation. Pt set up and participated in gait training stepping over cone specifically with RLE. During ambulation pt stating some dizziness therefore when returned to chair BP assessed (96/72 (80) HR 91 with symptoms resolving. Pt then ambulating ~63ft without stepping over cones. BP checked again once in sitting 110/74 (87) HR 79. Pt then participated in short bouts of corn hole with first bout pt able to toss x 4 bags before requesting to sit then x 5 bags on second round. Pt indicating increased pain with standing requiring pt to sit quicker. After second round of standing pt indicated episode of bowel incontinence. Pt transported back to room and completed  ambulatory transfer to bathroom with pt completing transfers and clothing management as prior. PTA assisted taking off brief to maintain spinal precautions and donned new brief. Pt with small BM while on toilet and once completed stood and PTA completed pericare total A. Pt then ambulated back to bed and doffed LSO with supervision. Pt returned to supine and stated too fatigued to complete remainder of session. Pt left in bed at end of session with bed alarm on, call bell within reach and needs met.   Tx2: Pt presented in bed with wife and friend present agreeable to therapy. Discussed with wife briefly pt's currently functional status as wife needed to leave. Pt states premedicated with unrated pain. Pt completed bed mobility with supervision and donned LSO with set up. Pt completed ambulatory transfer to w/c with CGA and RW. Pt propelled to main gym with supervision for general conditioning. Pt educated on ascending/descending stairs. Pt states have 2 rails which he can reach both. Pt was able to ascend/descend x 8 steps with step to pattern and CGA. After seated rest pt agreeable to work on ambulation. Pt stood with c/o dizziness upon standing, BP checked 101/69 (80) HR 75. Pt then ambulated 46ft with RW and CGA. Pt noted to have improved R knee flexion and heel strike however with fatigue noted mild genu recurvatum. After seated rest pt attempted additional ambulation ~10ft with increased GR noted. Educated pt regarding GR 2/2 quad weakness and as pt previously ambulating without bending knee more evident now. Pt propelled back to room and completed ambulatory transfer to bed with CGA and RW. Completed sit to supine with supervision however  pt holding and lifting RLE while using momentum to rock into bed. Pt enouraged to use RLE more to build strength. Pt acknowledged information but demonstrated limited reception. Pt left in bed at end of session with call bell within reach and needs met.      Therapy  Documentation Precautions:  Precautions Precautions: Back, Fall Recall of Precautions/Restrictions: Impaired Precaution/Restrictions Comments: recalled 3/3 without cue after reviewing with OT earlier in the am Required Braces or Orthoses: Spinal Brace Spinal Brace: Lumbar corset, Applied in sitting position Restrictions Weight Bearing Restrictions Per Provider Order: No Other Position/Activity Restrictions: +orthostatics General: PT Amount of Missed Time (min): 10 Minutes PT Missed Treatment Reason: Patient fatigue  Therapy/Group: Individual Therapy  Dianne Whelchel 04/26/2024, 12:53 PM

## 2024-04-26 NOTE — IPOC Note (Signed)
 Overall Plan of Care [facility]) Patient Details Name: Bradley Fisher MRN: [id number] DOB: 19-Mar-1966  Admitting Diagnosis: Lumbar radiculopathy  Hospital Problems: Principal Problem:   Lumbar radiculopathy     Functional Problem List: Nursing Bladder, Bowel, Edema, Endurance, Medication Management, Pain, Perception, Safety, Skin Integrity  PT Balance, Pain, Motor, Sensory, Safety  OT Balance, Safety, Endurance  SLP    TR         Basic ADL's: OT Grooming, Bathing, Dressing     Advanced  ADL's: OT       Transfers: PT Bed Mobility, Bed to Chair, Car  OT Toilet, Tub/Shower     Locomotion: PT Ambulation, Psychologist, prison and probation services, Stairs     Additional Impairments: OT None  SLP        TR      Anticipated Outcomes Item Anticipated Outcome  Self Feeding SBA  Swallowing      Basic self-care  SBA  Toileting  SBA   Bathroom Transfers SBA  Bowel/Bladder  manage bowels with medications/ manage bladder with toileting assistance  Transfers  supervision with RW  Locomotion  supervision with RW, mod I w/c  Communication     Cognition     Pain  <4 w/prns  Safety/Judgment  mamange safety with supervision assistance   Therapy Plan: PT Intensity: Minimum of 1-2 x/day ,45 to 90 minutes PT Frequency: 5 out of 7 days PT Duration Estimated Length of Stay: 7-10 days OT Intensity: Minimum of 1-2 x/day, 45 to 90 minutes OT Frequency: 5 out of 7 days OT Duration/Estimated Length of Stay: 7-10 days     Team Interventions: Nursing Interventions Patient/Family Education, Medication Management, Bladder Management, Bowel Management, Disease Management/Prevention, Pain Management, Discharge Planning, Skin Care/Wound Management  PT interventions Ambulation/gait training, Discharge planning, Functional mobility training, DME/adaptive equipment instruction, Pain management, Psychosocial support, Splinting/orthotics, Therapeutic Activities, UE/LE Strength taining/ROM,  Visual/perceptual remediation/compensation, Metallurgist training, Cognitive remediation/compensation, Community reintegration, Disease management/prevention, Functional electrical stimulation, Neuromuscular re-education, Patient/family education, Skin care/wound management, Stair training, Therapeutic Exercise, UE/LE Coordination activities, Wheelchair propulsion/positioning  OT Interventions Warden/ranger, Discharge planning, Pain management, Self Care/advanced ADL retraining, Therapeutic Activities, UE/LE Coordination activities, Cognitive remediation/compensation, Disease mangement/prevention, Functional mobility training, Patient/family education, Skin care/wound managment, Therapeutic Exercise, Community reintegration, Fish farm manager, Neuromuscular re-education, Psychosocial support, UE/LE Strength taining/ROM, Wheelchair propulsion/positioning  SLP Interventions    TR Interventions    SW/CM Interventions Discharge Planning, Psychosocial Support, Patient/Family Education   Barriers to Discharge MD  Medical stability, Home enviroment access/loayout, Wound care, Lack of/limited family support, Weight, Weight bearing restrictions, and Medication compliance  Nursing Decreased caregiver support, Home environment access/layout Discharge: Mobile home  Discharge Home Layout: One level  Discharge Home Access: Stairs to enter  Entrance Stairs-Rails: Right, Left  Entrance Stairs-Number of Steps: 5-6  PT Inaccessible home environment, Home environment access/layout, Decreased caregiver support pt's wife also having surgery this week  OT Decreased caregiver support    SLP      SW Insurance for SNF coverage, Decreased caregiver support     Team Discharge Planning: Destination: PT-Home ,OT- Home , SLP-  Projected Follow-up: PT-Home health PT, OT-  Home health OT, 24 hour supervision/assistance, SLP-  Projected Equipment Needs: PT-To be determined, OT- To be  determined, SLP-  Equipment Details: PT- , OT-  Patient/family involved in discharge planning: PT- Patient,  OT-Patient, SLP-   MD ELOS: 7-10 days- pt wants to leave ASAP- not ready Medical Rehab Prognosis:  Good Assessment: The patient has been admitted for CIR therapies with  the diagnosis of lumbar stenosis/radiculopathy s/p T10- sacral fusion. The team will be addressing functional mobility, strength, stamina, balance, safety, adaptive techniques and equipment, self-care, bowel and bladder mgt, patient and caregiver education, . Goals have been set at SBA. Anticipated discharge destination is home with wife after wife has stents.        See Team Conference Notes for weekly updates to the plan of care

## 2024-04-27 MED ORDER — POLYETHYLENE GLYCOL 3350 17 G PO PACK
17.0000 g | PACK | Freq: Every day | ORAL | Status: AC
Start: 2024-04-27 — End: ?

## 2024-04-27 MED ORDER — DOCUSATE SODIUM 100 MG PO CAPS: 100.0000 mg | ORAL_CAPSULE | Freq: Two times a day (BID) | ORAL | Status: AC | PRN

## 2024-04-27 NOTE — Progress Notes (Signed)
 PROGRESS NOTE   Subjective/Complaints:  Pt reports pain OK with pain meds q4 hours- but educated pt would not go home on q4 hours- would go home on q6 hours per federal guidelines.    Back is bothering him some this AM.  Bedpan made it hurt worse.  No BM yet- usually goes 3-6 days without BM at home- educated him he's on constipating pain meds more frequently than home dosing.    ROS:   Pt denies SOB, abd pain, CP, N/V/ (+) although pt doesn't feel constipated- C/D, and vision changes    Objective:   No results found.  No results for input(s): "WBC", "HGB", "HCT", "PLT" in the last 72 hours.  Recent Labs    04/25/24 0519 04/26/24 0543  NA 128* 130*  K 4.4 4.2  CL 94* 95*  CO2 23 26  GLUCOSE 106* 101*  BUN 13 12  CREATININE 0.61 0.65  CALCIUM  8.9 9.3    Intake/Output Summary (Last 24 hours) at 04/27/2024 0902 Last data filed at 04/27/2024 0304 Gross per 24 hour  Intake 480 ml  Output 450 ml  Net 30 ml        Physical Exam: Vital Signs Blood pressure 129/77, pulse 69, temperature 98 F (36.7 C), resp. rate 18, height 6' (1.829 m), weight 92.4 kg, SpO2 97%.      General: awake, alert, appropriate, sitting up in bed; NAD HENT: conjugate gaze; oropharynx moist CV: regular rate and rhythm; no JVD Pulmonary: CTA B/L; no W/R/R- good air movement GI: soft, NT, ND, (+)BS- hypoactive Psychiatric: appropriate Neurological: Ox3 Mumbling- I think due to lack of teeth? Neurologic: Cranial nerves II through XII intact, motor strength is 5/5 in bilateral deltoid, bicep, tricep, grip, left hip flexor, knee extensors, ankle dorsiflexor and plantar flexor Lower extremity to minus hip flexion 3 - knee extension 4/5 ankle dorsiflexion plantarflexion Sensory exam reduced sensation right L3, right L4 dermatomal distribution Cerebellar exam normal finger to nose to finger  Musculoskeletal: Full range of motion upper  extremities.  Lower extremity weakness limits active range of motion but is able to go passively.  No joint swelling    Assessment/Plan: 1. Functional deficits which require 3+ hours per day of interdisciplinary therapy in a comprehensive inpatient rehab setting. Physiatrist is providing close team supervision and 24 hour management of active medical problems listed below. Physiatrist and rehab team continue to assess barriers to discharge/monitor patient progress toward functional and medical goals  Care Tool:  Bathing              Bathing assist Assist Level: Minimal Assistance - Patient > 75%     Upper Body Dressing/Undressing Upper body dressing        Upper body assist Assist Level: Supervision/Verbal cueing    Lower Body Dressing/Undressing Lower body dressing      What is the patient wearing?: Underwear/pull up, Pants     Lower body assist Assist for lower body dressing: Minimal Assistance - Patient > 75%     Toileting Toileting    Toileting assist Assist for toileting: Minimal Assistance - Patient > 75%     Transfers Chair/bed transfer  Transfers assist  Chair/bed transfer assist level: Minimal Assistance - Patient > 75%     Locomotion Ambulation   Ambulation assist      Assist level: Minimal Assistance - Patient > 75% Assistive device: Walker-rolling Max distance: 15   Walk 10 feet activity   Assist     Assist level: Minimal Assistance - Patient > 75% Assistive device: Walker-rolling   Walk 50 feet activity   Assist Walk 50 feet with 2 turns activity did not occur: Safety/medical concerns         Walk 150 feet activity   Assist Walk 150 feet activity did not occur: Safety/medical concerns         Walk 10 feet on uneven surface  activity   Assist Walk 10 feet on uneven surfaces activity did not occur: Safety/medical concerns         Wheelchair     Assist Is the patient using a wheelchair?: Yes Type of  Wheelchair: Manual    Wheelchair assist level: Supervision/Verbal cueing Max wheelchair distance: 5    Wheelchair 50 feet with 2 turns activity    Assist    Wheelchair 50 feet with 2 turns activity did not occur: Refused       Wheelchair 150 feet activity     Assist  Wheelchair 150 feet activity did not occur: Refused       Blood pressure 129/77, pulse 69, temperature 98 F (36.7 C), resp. rate 18, height 6' (1.829 m), weight 92.4 kg, SpO2 97%.    Medical Problem List and Plan: 1. Functional deficits secondary to lumbar spondylosis/stenosis/radiculopathy status post L4-5, L5-S1 ALIF, L1-2, L3-4 DLIF, T10 to ilium posterior instrumented fusion 04/11/2024 and second stage 04/13/2024.  Back brace when out of bed applied in sitting position.             -patient may  shower             -ELOS/Goals: 7 to 10 days, supervision goals, patient states that someone told him he would go home on Friday but we discussed that discharge date would not be set until patient evaluated at rehab  5/19 d/c  Con't CIR PT and OT Pt OK with Monday d/c- but asking about timing today  Will not need f/u with me 2.  Antithrombotics: -DVT/anticoagulation:  Pharmaceutical: Lovenox .  Check vascular study 5/14- Dopplers (-)             -antiplatelet therapy: N/A 3. Pain Management: Neurontin  400 mg 3 times daily, Robaxin  and oxycodone  as needed Has been taking oxycodone  40 to 60 mg/day postoperatively.  Premorbidly he was on chronic hydrocodone .  5/13- Oxy q3 hours as needed- will not send on q3- not more than q4-6 at d/c. 5/14- taking ~ q4 hours? 5/15- taking frequently- will need to decrease dose to q4 hours prn- done   5/16- educated pt that would go home with only q6 hours prn pain meds, not q4 hours prn 4. Mood/Behavior/Sleep: Provide emotional support.  Resume Zoloft  100 mg daily             -antipsychotic agents: N/A 5. Neuropsych/cognition: This patient is capable of making decisions on his  own behalf. 6. Skin/Wound Care: Routine skin checks  5/13- incision C/D/I 7. Fluids/Electrolytes/Nutrition: Routine in and outs with follow-up chemistries 8.  Acute blood loss anemia.  Follow-up CBC  5/13- Hb 9.4- down from 10- but prior was 9.2-will monitor 9.  Hypertension.  Norvasc  10 mg daily, Avapro  150 mg daily.  Monitor with  increased mobility  5/13- 5/15- BP controlled so far- con't regimen 10 hyperlipidemia.  Crestor  11.  COPD/tobacco use.  Continue nebulizer as needed.  Provide counseling regarding cessation of tobacco 12.  History of alcohol use disorder.  Provide counseling 13.  GERD.  Protonix   14. Hyponatremia  5/13- Na 128 this AM down from 134- will recheck in AM since dropped so abruptly, might be off- and if worse, then will evaluate more /treat.   5/14- they just restarted his Sertraline  100 mg daily- that's probably what dropped his Na- which has been OK in hospital, likely due to not being on Zoloft  in hospital until now- don't see any other reason for drop in Na- will check in AM- if drops more, will treat/stop Zoloft .   5/15- Na 130 today- will wait on treating since doing better  15. Constipation  5/15- LBM 2 days ago- not on bowel meds- just prn- will start Senna 2 tabs daily and Miralax  daily since on so much pain medicine- pt reluctant, but explained his pain meds constipate him and taking pretty much max dose.   5/16- hasn't had a BM but pt mentioned doesn't want bowel meds- explained on a lot of pain meds which constipate patients, but he said he can go 4-6 days before BM at home- explained that's concerning, but he's "fine with it". - hasn't had BM since 5/13- but feels could go today. If no BM, needs sorbitol tomorrow  I spent a total of 36   minutes on total care today- >50% coordination of care- due to  D/w pt and education about bowels and pain meds- also d/w nursing about issues.    LOS: 4 days A FACE TO FACE EVALUATION WAS PERFORMED  Naja Apperson 04/27/2024, 9:02 AM

## 2024-04-27 NOTE — Progress Notes (Signed)
 Physical Therapy Session Note  Patient Details  Name: Bradley Fisher MRN: [id number] Date of Birth: [birth year]  Today's Date: 04/27/2024 PT Individual Time: 1125-1205 and 1400-1515 PT Individual Time Calculation (min): 40 min and 75 min  Short Term Goals: Week 1:  PT Short Term Goal 1 (Week 1): =LTGs d/t ELOS  Skilled Therapeutic Interventions/Progress Updates: Pt presented in bed agreeable to therapy. Pt c/o pain in back and RLE, premedicated with rest breaks provided as needed. Pt initially refusing to wear TED hose therefore orthostatics performed as noted below. Upon sitting pt donned LSO with set up. Pt stood from EOB with close supervision and requiring CGA to minimize movement in RUE. Pt was unable to tolerate standing to complete orthostatics. TED hose donned dependently while pt sitting EOB. Pt then stood with supervision and RW and ambulated ~166ft to ortho gym. Pt continues to demonstrate improved gait mechanics with improved heel strike and decreased ataxia, however genu recurvatum noted. Pt required increased time for seated rest due to fatigue with this therapist providing education on utilizing RLE as much as possible vs using hands to move LE. Pt continues to require reinforcement to perform this. Pt then ambulated back to room in same manner as prior with light CGA. In room pt doffed LSO mod I and returned to supine with supervision with verbal cues to maintain spinal precautions. Pt left in bed at end of session with bed alarm on, call bell within reach and needs met.   Tx2: Pt presented in bed agreeable to therapy. Pt c/o pain in back premedicated with rest and repositioning provided during session as needed. Discussed d/c day and recommended pt try shoes today. Pt statin only has boots that he walked in with. With encouragement pt completed bed mobility with supervision and PTA assisted in donning boots to allow pt to maintain back precautions. Pt stood with supervision from  lowered surface and ambulated ~142ft with RW and CGA. Pt noted to have decreased foot clearance with boots but no catching noted. Pt sat in w/c and propelled remaining distance to day room. Completed ambulatory transfer to high/low mat with close supervision and RW. Participated in Sit to stand holding soccer ball at chest for increased BLE ms recruitment and static standing balance. Pt noted to use LLE>RLE with mild lean noted. PTA providing tactile cues for decreasing BOS (pt tended to maintain wide stance). Pt then worked on RLE flexion participating in toe taps initially to 6in step but had to be lowered to 3.5in step due to pt unable to engage hip flexors adequately for lift. Pt also worked on weight bearing through RLE performing toe taps with LLE. PTA blocking R knee for safety with knee instability noted and x 1 episode of knee buckling with PTA blocking when fatigued. Pt then transferred to w/c in same manner as prior and completed RLE therex for strengthening. Performed LAQ 2 x 10 with 2.5lb cuffs, AROM hip flexion x 10, hamstring pulls with green theraband 2 x 10. After a rest break pt ambulated back towards room with RW and light CGA ~142ft, noted increased genu recurvatum with fatigue. Pt the expressed need for bathroom, transported remaining distance to room and completed ambulatory transfer to bathroom with RW and supervision. Supervision for clothing management and transfers (continent urinary void). Once completed pt transferred to bed and doffed LSO mod I and sit to supine with supervision with pt holding RLE to put in bed. Pt left in bed at end of session with  bed alarm on, call bell with in reach and needs met.      Therapy Documentation Precautions:  Precautions Precautions: Back, Fall Recall of Precautions/Restrictions: Impaired Precaution/Restrictions Comments: recalled 3/3 without cue after reviewing with OT earlier in the am Required Braces or Orthoses: Spinal Brace Spinal Brace:  Lumbar corset, Applied in sitting position Restrictions Weight Bearing Restrictions Per Provider Order: No Other Position/Activity Restrictions: +orthostatics General:   Vital Signs:   Pain: Pain Assessment Pain Scale: 0-10 Pain Score: 9  Pain Location: Back Pain Intervention(s): Pain med given for lower pain score than stated, per patient request   Therapy/Group: Individual Therapy  Lemon Whitacre 04/27/2024, 12:51 PM

## 2024-04-27 NOTE — Progress Notes (Signed)
 Occupational Therapy Session Note  Patient Details  Name: Bradley Fisher MRN: [id number] Date of Birth: Jan 11, 1966  Today's Date: 04/27/2024 OT Individual Time: 4098-1191 OT Individual Time Calculation (min): 46 min    Short Term Goals: Week 1:  OT Short Term Goal 1 (Week 1): STG=LTG d/t ELOS  Skilled Therapeutic Interventions/Progress Updates: Patient received resting in bed. Reports waking at 1 am and not being able to get any rest since then. Agreeable to OT treatment, but declined need/assist with ADL tasks. Patient assisted to EOB with CGA for safe log roll. Patient able to don brace with set up. Transferred to w/c using RW CGA. Patient only reports feeling able to take a few step to w/c vs walking towards therapy gym due to pain and RLE weakness. Assisted patient in w/c to therapy gym for seated strength and endurance tasks. Patient's step son visited to inform patient to his wife's status following an outpatient procedure. Patient's step son commented that patient will have difficulty not doing yard work once discharged home; discussed the importance of following precautions and the reason for the precautions. Encouraged family to remind patient and educated patient on possible negative outcomes if his is unable to follow his precaution. Continued treatment with dowel exercises working on BUE strengthening and building improved activity tolerance.  2 sets of 10 in varied planes with encouragement to engage abdominal muscles with and forward reaching movements. Assisted patient back to room and back into bed at patient's request at the end of his treatment session. Continue with skilled POC.     Therapy Documentation Precautions:  Precautions Precautions: Back, Fall Recall of Precautions/Restrictions: Impaired Precaution/Restrictions Comments: recalled 3/3 without cue after reviewing with OT earlier in the am Required Braces or Orthoses: Spinal Brace Spinal Brace: Lumbar corset,  Applied in sitting position Restrictions Weight Bearing Restrictions Per Provider Order: No Other Position/Activity Restrictions: +orthostatics    Pain: Pain Assessment Pain Scale: 0-10 Pain Score: 6  Pain Location: Back      Therapy/Group: Individual Therapy  Marty Sleet 04/27/2024, 9:59 AM

## 2024-04-27 NOTE — Progress Notes (Signed)
 Physical Therapy Session Note  Patient Details  Name: Bradley Fisher MRN: [id number] Date of Birth: November 16, 1966  Today's Date: 04/27/2024 PT Individual Time: 1015-1045 PT Individual Time Calculation (min): 30 min   Short Term Goals: Week 1:  PT Short Term Goal 1 (Week 1): =LTGs d/t ELOS   Skilled Therapeutic Interventions/Progress Updates:    Session focused on functional bed mobility, overall activity tolerance and upright tolerance to mobility, endurance, functional strengthening of BLE (R > L), and overall mobility including transfers and gait. Pt performed bed mobility throughout session at an overall supervision level with extra time and cues for precautions. Assisted with donning of LSO on EOB. Transfers throughout session with overall CGA with RW and 1 epsiode of min A needed due to LOB.  FUnctional gait training for general strengthening and mobility x 50' to tolerance and pt request to sit due to fatigue. Instructed in standing therex for functional strengthening and balance with UE support including standing R hip abduction, R hamstring curls, x 10 reps each and then pt reports needing to use the bathroom. Returned to room via w/c total assist and transferred to toilet ambulatory with RW with CGA. No BM/void occurred. Continued therex in room including LAQ (R) with active assisted ROM, standing heel raises (unable to fully complete) and standing mini squats x 10 reps each with cues for technique. Pt with frequent rest breaks needed throughout session and difficulty fully completeing an activity. Repositioned in the bed and all needs in reach.   Therapy Documentation Precautions:  Precautions Precautions: Back, Fall Recall of Precautions/Restrictions: Impaired Precaution/Restrictions Comments: recalled 3/3 without cue after reviewing with OT earlier in the am Required Braces or Orthoses: Spinal Brace Spinal Brace: Lumbar corset, Applied in sitting position Restrictions Weight  Bearing Restrictions Per Provider Order: No Other Position/Activity Restrictions: +orthostatics    Pain:  Reporting back pain - states LSO makes it worse. Knew his next dose of pain medication due at end of session. Rest breaks provided to improve tolerance.     Therapy/Group: Individual Therapy  Gita Lamb Amadeo June, PT, DPT, CBIS  04/27/2024, 11:06 AM

## 2024-04-28 DIAGNOSIS — K5900 Constipation, unspecified: Secondary | ICD-10-CM

## 2024-04-28 DIAGNOSIS — G8929 Other chronic pain: Secondary | ICD-10-CM

## 2024-04-28 DIAGNOSIS — I10 Essential (primary) hypertension: Secondary | ICD-10-CM

## 2024-04-28 DIAGNOSIS — M545 Low back pain, unspecified: Secondary | ICD-10-CM

## 2024-04-28 MED ORDER — HYDROCODONE-ACETAMINOPHEN 10-325 MG PO TABS: 1.0000 | ORAL_TABLET | ORAL | Status: DC | PRN

## 2024-04-28 MED ORDER — SORBITOL 70 % SOLN: 60.0000 mL | Freq: Once | Status: DC

## 2024-04-28 MED ORDER — HYDROCODONE-ACETAMINOPHEN 5-325 MG PO TABS: 1.0000 | ORAL_TABLET | ORAL | Status: DC | PRN

## 2024-04-28 MED ORDER — HYDROCODONE-ACETAMINOPHEN 10-325 MG PO TABS
1.0000 | ORAL_TABLET | ORAL | Status: DC | PRN
  Administered 2024-04-28 – 2024-04-29 (×4): 1 via ORAL
  Filled 2024-04-28 (×6): qty 1

## 2024-04-28 NOTE — Progress Notes (Signed)
 PROGRESS NOTE   Subjective/Complaints:  Pt would like oxycodone  to be changed to hydrocodone , he feels that oxycodone  too addictive.   He is unsure when last BM was last documented 5/14.     ROS:   Pt denies fever,  SOB, abd pain, CP, N/V/ (+) although pt doesn't feel constipated- C/D, and vision changes + back pain- controlled with meds   Objective:   No results found.  No results for input(s): "WBC", "HGB", "HCT", "PLT" in the last 72 hours.  Recent Labs    04/26/24 0543  NA 130*  K 4.2  CL 95*  CO2 26  GLUCOSE 101*  BUN 12  CREATININE 0.65  CALCIUM  9.3    Intake/Output Summary (Last 24 hours) at 04/28/2024 1046 Last data filed at 04/28/2024 0733 Gross per 24 hour  Intake 0 ml  Output 350 ml  Net -350 ml        Physical Exam: Vital Signs Blood pressure 111/76, pulse 73, temperature (!) 97 F (36.1 C), temperature source Oral, resp. rate 17, height 6' (1.829 m), weight 92.4 kg, SpO2 95%.      General: awake, alert, appropriate, laying in bed NAD HENT: conjugate gaze; oropharynx moist CV: RRR Pulmonary: CTAB, no increase WOB GI: soft, NT, ND, (+)BS- hypoactive Psychiatric: appropriate Neurological: Ox3 Mumbling- I think due to lack of teeth? Neurologic: Cranial nerves II through XII intact, motor strength is 5/5 in bilateral deltoid, bicep, tricep, grip, left hip flexor, knee extensors, ankle dorsiflexor and plantar flexor Lower extremity to minus hip flexion 3 - knee extension 4/5 ankle dorsiflexion plantarflexion Sensory exam reduced sensation right L3, right L4 dermatomal distribution Cerebellar exam normal finger to nose to finger  Musculoskeletal: Full range of motion upper extremities.  Lower extremity weakness limits active range of motion but is able to go passively.  No joint swelling    Assessment/Plan: 1. Functional deficits which require 3+ hours per day of interdisciplinary  therapy in a comprehensive inpatient rehab setting. Physiatrist is providing close team supervision and 24 hour management of active medical problems listed below. Physiatrist and rehab team continue to assess barriers to discharge/monitor patient progress toward functional and medical goals  Care Tool:  Bathing              Bathing assist Assist Level: Minimal Assistance - Patient > 75%     Upper Body Dressing/Undressing Upper body dressing        Upper body assist Assist Level: Supervision/Verbal cueing    Lower Body Dressing/Undressing Lower body dressing      What is the patient wearing?: Underwear/pull up, Pants     Lower body assist Assist for lower body dressing: Minimal Assistance - Patient > 75%     Toileting Toileting    Toileting assist Assist for toileting: Minimal Assistance - Patient > 75%     Transfers Chair/bed transfer  Transfers assist     Chair/bed transfer assist level: Supervision/Verbal cueing     Locomotion Ambulation   Ambulation assist      Assist level: Contact Guard/Touching assist Assistive device: Walker-rolling Max distance: 145   Walk 10 feet activity   Assist  Assist level: Contact Guard/Touching assist Assistive device: Walker-rolling   Walk 50 feet activity   Assist Walk 50 feet with 2 turns activity did not occur: Safety/medical concerns  Assist level: Contact Guard/Touching assist Assistive device: Walker-rolling    Walk 150 feet activity   Assist Walk 150 feet activity did not occur: Safety/medical concerns         Walk 10 feet on uneven surface  activity   Assist Walk 10 feet on uneven surfaces activity did not occur: Safety/medical concerns         Wheelchair     Assist Is the patient using a wheelchair?: Yes Type of Wheelchair: Manual    Wheelchair assist level: Supervision/Verbal cueing Max wheelchair distance: 5    Wheelchair 50 feet with 2 turns  activity    Assist    Wheelchair 50 feet with 2 turns activity did not occur: Refused       Wheelchair 150 feet activity     Assist  Wheelchair 150 feet activity did not occur: Refused       Blood pressure 111/76, pulse 73, temperature (!) 97 F (36.1 C), temperature source Oral, resp. rate 17, height 6' (1.829 m), weight 92.4 kg, SpO2 95%.    Medical Problem List and Plan: 1. Functional deficits secondary to lumbar spondylosis/stenosis/radiculopathy status post L4-5, L5-S1 ALIF, L1-2, L3-4 DLIF, T10 to ilium posterior instrumented fusion 04/11/2024 and second stage 04/13/2024.  Back brace when out of bed applied in sitting position.             -patient may  shower             -ELOS/Goals: 7 to 10 days, supervision goals, patient states that someone told him he would go home on Friday but we discussed that discharge date would not be set until patient evaluated at rehab  5/19 d/c  Con't CIR PT and OT Pt OK with Monday d/c- but asking about timing today  Will not need f/u with me  -Walked ordered by social work 2.  Antithrombotics: -DVT/anticoagulation:  Pharmaceutical: Lovenox .  Check vascular study 5/14- Dopplers (-)             -antiplatelet therapy: N/A 3. Pain Management: Neurontin  400 mg 3 times daily, Robaxin  and oxycodone  as needed Has been taking oxycodone  40 to 60 mg/day postoperatively.  Premorbidly he was on chronic hydrocodone .  5/13- Oxy q3 hours as needed- will not send on q3- not more than q4-6 at d/c. 5/14- taking ~ q4 hours? 5/15- taking frequently- will need to decrease dose to q4 hours prn- done   5/16- educated pt that would go home with only q6 hours prn pain meds, not q4 hours prn 5/17 Pain medications changed from oxycodone  to hydrocodone  per patient request.  Discussed that all opioid pain medications can be addicting. 4. Mood/Behavior/Sleep: Provide emotional support.  Resume Zoloft  100 mg daily             -antipsychotic agents: N/A 5.  Neuropsych/cognition: This patient is capable of making decisions on his own behalf. 6. Skin/Wound Care: Routine skin checks  5/13- incision C/D/I 7. Fluids/Electrolytes/Nutrition: Routine in and outs with follow-up chemistries 8.  Acute blood loss anemia.  Follow-up CBC  5/13- Hb 9.4- down from 10- but prior was 9.2-will monitor 9.  Hypertension.  Norvasc  10 mg daily, Avapro  150 mg daily.  Monitor with increased mobility  5/13- 5/15- BP controlled so far- con't regimen  5/17 BP controlled  04/28/2024    5:44 AM 04/28/2024    5:42 AM 04/27/2024    8:16 PM  Vitals with BMI  Systolic 111 111 629  Diastolic 76 76 79  Pulse 73 73 67    10 hyperlipidemia.  Crestor  11.  COPD/tobacco use.  Continue nebulizer as needed.  Provide counseling regarding cessation of tobacco 12.  History of alcohol use disorder.  Provide counseling 13.  GERD.  Protonix   14. Hyponatremia  5/13- Na 128 this AM down from 134- will recheck in AM since dropped so abruptly, might be off- and if worse, then will evaluate more /treat.   5/14- they just restarted his Sertraline  100 mg daily- that's probably what dropped his Na- which has been OK in hospital, likely due to not being on Zoloft  in hospital until now- don't see any other reason for drop in Na- will check in AM- if drops more, will treat/stop Zoloft .   5/15- Na 130 today- will wait on treating since doing better  15. Constipation  5/15- LBM 2 days ago- not on bowel meds- just prn- will start Senna 2 tabs daily and Miralax  daily since on so much pain medicine- pt reluctant, but explained his pain meds constipate him and taking pretty much max dose.   5/16- hasn't had a BM but pt mentioned doesn't want bowel meds- explained on a lot of pain meds which constipate patients, but he said he can go 4-6 days before BM at home- explained that's concerning, but he's "fine with it". - hasn't had BM since 5/13- but feels could go today. If no BM, needs sorbitol  tomorrow  -5/17 sorbitol ordered 60ml   LOS: 5 days A FACE TO FACE EVALUATION WAS PERFORMED  Lylia Sand 04/28/2024, 10:46 AM

## 2024-04-29 NOTE — Progress Notes (Addendum)
 PROGRESS NOTE   Subjective/Complaints:  Pt asks about when his walker will arrive- nursing to check on this. Pt on outpatient hydrocodone  10-325, looks like 6 times a day per PDMP.   LBM 5/17   ROS:   Pt denies fever, chills, SOB, abd pain, CP, N/V/ C/D, and vision changes + back pain- controlled with meds    Objective:   No results found.  No results for input(s): "WBC", "HGB", "HCT", "PLT" in the last 72 hours.  No results for input(s): "NA", "K", "CL", "CO2", "GLUCOSE", "BUN", "CREATININE", "CALCIUM " in the last 72 hours.   Intake/Output Summary (Last 24 hours) at 04/29/2024 1158 Last data filed at 04/29/2024 0706 Gross per 24 hour  Intake 118.5 ml  Output 550 ml  Net -431.5 ml        Physical Exam: Vital Signs Blood pressure 126/72, pulse 68, temperature 97.9 F (36.6 C), temperature source Oral, resp. rate 18, height 6' (1.829 m), weight 92.4 kg, SpO2 99%.      General: awake, alert, appropriate, laying in bed NAD HENT: conjugate gaze; oropharynx moist CV: RRR Pulmonary: CTAB, no increase WOB GI: soft, NT, ND, (+)BS- hypoactive Psychiatric: appropriate, a little anxious about discharge Neurological: Ox3 Mumbling- I think due to lack of teeth? Neurologic: Cranial nerves II through XII intact, motor strength is 5/5 in bilateral deltoid, bicep, tricep, grip, left hip flexor, knee extensors, ankle dorsiflexor and plantar flexor Lower extremity to minus hip flexion 3 - knee extension 4/5 ankle dorsiflexion plantarflexion Sensory exam reduced sensation right L3, right L4 dermatomal distribution Cerebellar exam normal finger to nose to finger  Musculoskeletal: Full range of motion upper extremities.  Lower extremity weakness limits active range of motion but is able to go passively.  No joint swelling    Assessment/Plan: 1. Functional deficits which require 3+ hours per day of interdisciplinary therapy  in a comprehensive inpatient rehab setting. Physiatrist is providing close team supervision and 24 hour management of active medical problems listed below. Physiatrist and rehab team continue to assess barriers to discharge/monitor patient progress toward functional and medical goals  Care Tool:  Bathing              Bathing assist Assist Level: Minimal Assistance - Patient > 75%     Upper Body Dressing/Undressing Upper body dressing        Upper body assist Assist Level: Supervision/Verbal cueing    Lower Body Dressing/Undressing Lower body dressing      What is the patient wearing?: Underwear/pull up, Pants     Lower body assist Assist for lower body dressing: Minimal Assistance - Patient > 75%     Toileting Toileting    Toileting assist Assist for toileting: Minimal Assistance - Patient > 75%     Transfers Chair/bed transfer  Transfers assist     Chair/bed transfer assist level: Supervision/Verbal cueing     Locomotion Ambulation   Ambulation assist      Assist level: Contact Guard/Touching assist Assistive device: Walker-rolling Max distance: 139ft   Walk 10 feet activity   Assist     Assist level: Supervision/Verbal cueing Assistive device: Walker-rolling   Walk 50 feet activity  Assist Walk 50 feet with 2 turns activity did not occur: Safety/medical concerns  Assist level: Supervision/Verbal cueing Assistive device: Walker-rolling    Walk 150 feet activity   Assist Walk 150 feet activity did not occur: Safety/medical concerns  Assist level: Contact Guard/Touching assist Assistive device: Walker-rolling    Walk 10 feet on uneven surface  activity   Assist Walk 10 feet on uneven surfaces activity did not occur: Safety/medical concerns   Assist level: Contact Guard/Touching assist Assistive device: Walker-rolling   Wheelchair     Assist Is the patient using a wheelchair?: Yes Type of Wheelchair: Manual     Wheelchair assist level: Set up assist Max wheelchair distance: 150    Wheelchair 50 feet with 2 turns activity    Assist    Wheelchair 50 feet with 2 turns activity did not occur: Refused   Assist Level: Supervision/Verbal cueing   Wheelchair 150 feet activity     Assist  Wheelchair 150 feet activity did not occur: Refused   Assist Level: Supervision/Verbal cueing   Blood pressure 126/72, pulse 68, temperature 97.9 F (36.6 C), temperature source Oral, resp. rate 18, height 6' (1.829 m), weight 92.4 kg, SpO2 99%.    Medical Problem List and Plan: 1. Functional deficits secondary to lumbar spondylosis/stenosis/radiculopathy status post L4-5, L5-S1 ALIF, L1-2, L3-4 DLIF, T10 to ilium posterior instrumented fusion 04/11/2024 and second stage 04/13/2024.  Back brace when out of bed applied in sitting position.             -patient may  shower             -ELOS/Goals: 7 to 10 days, supervision goals, patient states that someone told him he would go home on Friday but we discussed that discharge date would not be set until patient evaluated at rehab  5/19 d/c  Con't CIR PT and OT Pt OK with Monday d/c- but asking about timing today  Will not need f/u with me  -Otho Blitz ordered by social work- nursing to check on status 2.  Antithrombotics: -DVT/anticoagulation:  Pharmaceutical: Lovenox .  Check vascular study 5/14- Dopplers (-)             -antiplatelet therapy: N/A 3. Pain Management: Neurontin  400 mg 3 times daily, Robaxin  and oxycodone  as needed Has been taking oxycodone  40 to 60 mg/day postoperatively.  Premorbidly he was on chronic hydrocodone .  5/13- Oxy q3 hours as needed- will not send on q3- not more than q4-6 at d/c. 5/14- taking ~ q4 hours? 5/15- taking frequently- will need to decrease dose to q4 hours prn- done   5/16- educated pt that would go home with only q6 hours prn pain meds, not q4 hours prn 5/17 Pain medications changed from oxycodone  to hydrocodone   per patient request.  Discussed that all opioid pain medications can be addicting. 5/18 Pain ok with hydrocodone , he gets this at home but out of meds. Advised he call his pain clinic tomorrow to coordinate-do not want to violate his pain contract 4. Mood/Behavior/Sleep: Provide emotional support.  Resume Zoloft  100 mg daily             -antipsychotic agents: N/A 5. Neuropsych/cognition: This patient is capable of making decisions on his own behalf. 6. Skin/Wound Care: Routine skin checks  5/13- incision C/D/I 7. Fluids/Electrolytes/Nutrition: Routine in and outs with follow-up chemistries 8.  Acute blood loss anemia.  Follow-up CBC  5/13- Hb 9.4- down from 10- but prior was 9.2-will monitor 9.  Hypertension.  Norvasc   10 mg daily, Avapro  150 mg daily.  Monitor with increased mobility  5/13- 5/15- BP controlled so far- con't regimen  5/18 BP controlled-monitor      04/29/2024    4:20 AM 04/28/2024    8:16 PM 04/28/2024    1:26 PM  Vitals with BMI  Systolic 126 124 811  Diastolic 72 79 64  Pulse 68 62 82    10 hyperlipidemia.  Crestor  11.  COPD/tobacco use.  Continue nebulizer as needed.  Provide counseling regarding cessation of tobacco 12.  History of alcohol use disorder.  Provide counseling 13.  GERD.  Protonix   14. Hyponatremia  5/13- Na 128 this AM down from 134- will recheck in AM since dropped so abruptly, might be off- and if worse, then will evaluate more /treat.   5/14- they just restarted his Sertraline  100 mg daily- that's probably what dropped his Na- which has been OK in hospital, likely due to not being on Zoloft  in hospital until now- don't see any other reason for drop in Na- will check in AM- if drops more, will treat/stop Zoloft .   5/15- Na 130 today- will wait on treating since doing better  15. Constipation  5/15- LBM 2 days ago- not on bowel meds- just prn- will start Senna 2 tabs daily and Miralax  daily since on so much pain medicine- pt reluctant, but explained  his pain meds constipate him and taking pretty much max dose.   5/16- hasn't had a BM but pt mentioned doesn't want bowel meds- explained on a lot of pain meds which constipate patients, but he said he can go 4-6 days before BM at home- explained that's concerning, but he's "fine with it". - hasn't had BM since 5/13- but feels could go today. If no BM, needs sorbitol tomorrow  -5/17 sorbitol ordered 60ml  -5/18 LBM yesterday, monitor    LOS: 6 days A FACE TO FACE EVALUATION WAS PERFORMED  Lylia Sand 04/29/2024, 11:58 AM

## 2024-04-29 NOTE — Progress Notes (Signed)
 Physical Therapy Discharge Summary  Patient Details  Name: Bradley Fisher MRN: [id number] Date of Birth: 1966-08-05  Date of Discharge from PT service:Apr 29, 2024  Today's Date: 04/29/2024 PT Individual Time: 1050-1200 PT Individual Time Calculation (min): 70 min    Patient has met 7 of 8 long term goals due to improved activity tolerance, improved balance, increased strength, improved attention, improved awareness, and improved coordination.  Pt demonstrates adequate safety awareness with transfers and ambulation. He also continues to demonstrate R LE weakness however have developed compensatory strategies to improve mobility. This therapist has also spoken with pt's SO regarding supervision status on 5/18. Patient to discharge at an ambulatory level Supervision.   Patient's care partner is independent to provide the necessary physical assistance at discharge.  Reasons goals not met: Pt continues to demonstrate mild knee instability with longer distances requiring CGA for ambulation of longer distances. Pt is supervision at household level.   Recommendation:  Patient will benefit from ongoing skilled PT services in home health setting to continue to advance safe functional mobility, address ongoing impairments in strength, balance, functional mobility, endurance, and minimize fall risk.  Equipment: Agricultural consultant and 3:1 BSC  Reasons for discharge: treatment goals met and discharge from hospital  Patient/family agrees with progress made and goals achieved: Yes  PT Discharge    Pain Interference Pain Interference Pain Effect on Sleep: 1. Rarely or not at all Pain Interference with Therapy Activities: 1. Rarely or not at all Pain Interference with Day-to-Day Activities: 1. Rarely or not at all Vision/Perception  Vision - History Ability to See in Adequate Light: 0 Adequate Perception Perception: Within Functional Limits Praxis Praxis: WFL  Cognition Overall Cognitive  Status: Within Functional Limits for tasks assessed Arousal/Alertness: Awake/alert Memory: Appears intact Awareness: Appears intact Problem Solving: Appears intact Safety/Judgment: Impaired Comments: requiring cues to maintain back precautions Sensation Sensation Light Touch: Impaired by gross assessment Hot/Cold: Appears Intact Proprioception: Appears Intact Stereognosis: Not tested Motor  Motor Motor: Other (comment) Motor - Skilled Clinical Observations: 2/2 weakness, overall improved since evaluation  Mobility Bed Mobility Bed Mobility: Rolling Right;Rolling Left;Supine to Sit;Sit to Supine Rolling Left: Independent Supine to Sit: Supervision/Verbal cueing Sit to Supine: Supervision/Verbal cueing Transfers Transfers: Sit to Stand;Stand Pivot Transfers Sit to Stand: Supervision/Verbal cueing Transfer (Assistive device): Rolling walker Locomotion  Gait Ambulation: Yes Gait Assistance: Contact Guard/Touching assist;Supervision/Verbal cueing Gait Distance (Feet): 150 Feet Assistive device: Rolling walker Gait Gait: Yes Gait Pattern: Impaired Gait Pattern: Step-through pattern;Narrow base of support;Poor foot clearance - right Gait velocity: decreasaed Stairs / Additional Locomotion Stairs: Yes Stairs Assistance: Contact Guard/Touching assist Stair Management Technique: Two rails Number of Stairs: 8 Height of Stairs: 6 Pick up small object from the floor assist level: Supervision/Verbal cueing Pick up small object from the floor assistive device: use of Chief Operating Officer Mobility: Yes Wheelchair Assistance: Set up Education officer, museum: Both upper extremities Wheelchair Parts Management: Needs assistance Distance: 135ft  Trunk/Postural Assessment  Cervical Assessment Cervical Assessment: Within Functional Limits Thoracic Assessment Thoracic Assessment: Within Functional Limits Lumbar Assessment Lumbar Assessment: Exceptions to  [facility] Postural Control Postural Control: Within Functional Limits  Balance Balance Balance Assessed: Yes Static Sitting Balance Static Sitting - Balance Support: Feet supported Static Sitting - Level of Assistance: 7: Independent Dynamic Sitting Balance Dynamic Sitting - Balance Support: Feet supported Dynamic Sitting - Level of Assistance: 6: Modified independent (Device/Increase time) Static Standing Balance Static Standing - Balance Support: During functional activity Static Standing - Level of  Assistance: 5: Stand by assistance Dynamic Standing Balance Dynamic Standing - Balance Support: During functional activity Dynamic Standing - Level of Assistance: 5: Stand by assistance Dynamic Standing - Balance Activities: Ball toss Extremity Assessment  RUE Assessment RUE Assessment: Within Functional Limits LUE Assessment LUE Assessment: Within Functional Limits RLE Assessment RLE Assessment: Exceptions to [facility] Strength Comments: 2+/5 hip flexion, 4/5 hip abduction/adduction, 4+/5 knee and ankle LLE Assessment LLE Assessment: Within Functional Limits   Rosita DeChalus 04/29/2024, 2:32 PM  Ouida Bloom, PT, DPT, CBIS 05/03/24 3:36 PM

## 2024-04-29 NOTE — Progress Notes (Signed)
 Physical Therapy Session Note  Patient Details  Name: Bradley Fisher MRN: [id number] Date of Birth: [birth year]  Today's Date: 04/29/2024 PT Individual Time: 1050-1200 PT Individual Time Calculation (min): 70 min   Short Term Goals: Week 1:  PT Short Term Goal 1 (Week 1): =LTGs d/t ELOS  Skilled Therapeutic Interventions/Progress Updates: Pt presented in bed agreeable to therapy. Pt c/o increased pain in back did not rate, nsg notified and pain meds provided during session. Session focused on functional mobility in preparation for d/c. Participated in bed mobility, transfers at supervision to mod I level. Pt ambulated to ortho gym and completed car transfer at supervision level. Pt then propelled to main gym and completed ascending/descending stairs at [facility] nearing close supervision demonstrating good safety. Pt also participated in varying balance and transfer activities at close supervision. Discussed upon d/c not overdoing activities (cleaning etc) and to maintain spinal precautions to allow for appropriate healing. Pt propelled back to room and completed transfer back to bed at supervision level with RW. Pt called wife and spoke with wife regarding keeping pt at supervision level at home initially but that pt needs to continue moving and should not spend all day in bed. So voiced understanding. Pt left in bed at end of session with call bell within reach and needs met.      Therapy Documentation Precautions:  Precautions Precautions: Back, Fall Recall of Precautions/Restrictions: Intact Precaution/Restrictions Comments: recalled 2/3, required cues for 3/3 Required Braces or Orthoses: Spinal Brace Spinal Brace: Lumbar corset, Applied in sitting position Restrictions Weight Bearing Restrictions Per Provider Order: No Other Position/Activity Restrictions: +orthostatics General:   Vital Signs: Therapy Vitals Temp: 97.7 F (36.5 C) Temp Source: Oral Pulse Rate: 74 Resp: 16 BP:  92/68 Patient Position (if appropriate): Lying Oxygen Therapy SpO2: 97 % O2 Device: Room Air Pain:   Mobility: Bed Mobility Bed Mobility: Rolling Right;Rolling Left;Supine to Sit;Sit to Supine Rolling Left: Independent Supine to Sit: Supervision/Verbal cueing Sit to Supine: Supervision/Verbal cueing Transfers Transfers: Sit to Stand;Stand Pivot Transfers Sit to Stand: Supervision/Verbal cueing Transfer (Assistive device): Rolling walker Locomotion : Gait Ambulation: Yes Gait Assistance: Contact Guard/Touching assist;Supervision/Verbal cueing Gait Distance (Feet): 150 Feet Assistive device: Rolling walker Gait Gait: Yes Gait Pattern: Impaired Gait Pattern: Step-through pattern;Narrow base of support;Poor foot clearance - right Gait velocity: decreasaed Stairs / Additional Locomotion Stairs: Yes Stairs Assistance: Contact Guard/Touching assist Stair Management Technique: Two rails Number of Stairs: 8 Height of Stairs: 6 Wheelchair Mobility Wheelchair Mobility: Yes Wheelchair Assistance: Set up Education officer, museum: Both upper extremities Wheelchair Parts Management: Needs assistance Distance: 137ft  Trunk/Postural Assessment : Cervical Assessment Cervical Assessment: Within Functional Limits Thoracic Assessment Thoracic Assessment: Within Functional Limits Lumbar Assessment Lumbar Assessment: Exceptions to [facility] Postural Control Postural Control: Within Functional Limits  Balance: Balance Balance Assessed: Yes Static Sitting Balance Static Sitting - Balance Support: Feet supported Static Sitting - Level of Assistance: 7: Independent Dynamic Sitting Balance Dynamic Sitting - Balance Support: Feet supported Dynamic Sitting - Level of Assistance: 6: Modified independent (Device/Increase time) Static Standing Balance Static Standing - Balance Support: During functional activity Static Standing - Level of Assistance: 5: Stand by assistance Dynamic Standing  Balance Dynamic Standing - Balance Support: During functional activity Dynamic Standing - Level of Assistance: 5: Stand by assistance Dynamic Standing - Balance Activities: Celeste Cola toss Exercises:   Other Treatments:      Therapy/Group: Individual Therapy  Bradley Fisher 04/29/2024, 2:32 PM

## 2024-04-29 NOTE — Plan of Care (Signed)
  Problem: RH Balance Goal: LTG Patient will maintain dynamic standing with ADLs (OT) Description: LTG:  Patient will maintain dynamic standing balance with assist during activities of daily living (OT)  Outcome: Completed/Met Flowsheets (Taken 04/24/2024 1246) LTG: Pt will maintain dynamic standing balance during ADLs with: Supervision/Verbal cueing   Problem: RH Grooming Goal: LTG Patient will perform grooming w/assist,cues/equip (OT) Description: LTG: Patient will perform grooming with assist, with/without cues using equipment (OT) Outcome: Completed/Met Flowsheets (Taken 04/24/2024 1246) LTG: Pt will perform grooming with assistance level of: Supervision/Verbal cueing   Problem: RH Bathing Goal: LTG Patient will bathe all body parts with assist levels (OT) Description: LTG: Patient will bathe all body parts with assist levels (OT) Outcome: Completed/Met Flowsheets (Taken 04/24/2024 1246) LTG: Pt will perform bathing with assistance level/cueing: Supervision/Verbal cueing   Problem: RH Dressing Goal: LTG Patient will perform upper body dressing (OT) Description: LTG Patient will perform upper body dressing with assist, with/without cues (OT). Outcome: Completed/Met Flowsheets (Taken 04/24/2024 1246) LTG: Pt will perform upper body dressing with assistance level of: Set up assist Goal: LTG Patient will perform lower body dressing w/assist (OT) Description: LTG: Patient will perform lower body dressing with assist, with/without cues in positioning using equipment (OT) Outcome: Completed/Met Flowsheets (Taken 04/24/2024 1246) LTG: Pt will perform lower body dressing with assistance level of: Supervision/Verbal cueing   Problem: RH Toileting Goal: LTG Patient will perform toileting task (3/3 steps) with assistance level (OT) Description: LTG: Patient will perform toileting task (3/3 steps) with assistance level (OT)  Outcome: Completed/Met Flowsheets (Taken 04/24/2024 1246) LTG: Pt  will perform toileting task (3/3 steps) with assistance level: Supervision/Verbal cueing   Problem: RH Toilet Transfers Goal: LTG Patient will perform toilet transfers w/assist (OT) Description: LTG: Patient will perform toilet transfers with assist, with/without cues using equipment (OT) Outcome: Completed/Met Flowsheets (Taken 04/24/2024 1246) LTG: Pt will perform toilet transfers with assistance level of: Supervision/Verbal cueing   Problem: RH Tub/Shower Transfers Goal: LTG Patient will perform tub/shower transfers w/assist (OT) Description: LTG: Patient will perform tub/shower transfers with assist, with/without cues using equipment (OT) Outcome: Completed/Met Flowsheets (Taken 04/24/2024 1246) LTG: Pt will perform tub/shower stall transfers with assistance level of: Supervision/Verbal cueing

## 2024-04-29 NOTE — Progress Notes (Addendum)
 Physical Therapy Session Note  Patient Details  Name: Bradley Fisher MRN: [id number] Date of Birth: 1966/06/21  Today's Date: 04/29/2024 PT Individual Time: 1050-1200 PT Individual Time Calculation (min): 70 min   Short Term Goals: Week 1:  PT Short Term Goal 1 (Week 1): =LTGs d/t ELOS  Skilled Therapeutic Interventions/Progress Updates: Pt presented in bed agreeable to therapy. Session focused on functional mobility in preparation for d/c. Pt completed bed mobility mod I with HOB elevated. PTA donned TED hose total A for time management. Pt ambulated to ortho gym directly to car simulator and completed car transfer with supervision and increased time. Pt then transferred back to w/c with supervision. Pt propelled to main gym and ascended/descended x 8 steps with B rails and CGA. Pt demonstrates good safety with no cues needed for sequencing. Pt then ambulated over to mat and participated in use of rebounder using BUE. Pt able to maintain good balance with supervision. Pt then propelled back to room and completed ambulatory transfer to bed. In bed pt called wife and this therapist provided info that [facility] will contact pt and anticipate 2-3 sessions/wk but will be explained in detail by HHPT. Pt left resting in bed at end of session with bed alarm on, call bell within reach and needs met.   Provided pt with HEP per pt request.   Access Code: [email or web address] URL: [email or web address] Date: 04/29/2024 Prepared by: Sinda Duel Charron Coultas  Exercises - Supine Quad Set  - 1 x daily - 7 x weekly - 3 sets - 10 reps - Supine Short Arc Quad  - 1 x daily - 7 x weekly - 3 sets - 10 reps - Supine Heel Slide with Strap  - 1 x daily - 7 x weekly - 3 sets - 10 reps - Seated Long Arc Quad  - 1 x daily - 7 x weekly - 3 sets - 10 reps - Standing Quad Set  - 1 x daily - 7 x weekly - 3 sets - 10 reps      Therapy Documentation Precautions:  Precautions Precautions: Back, Fall Recall of  Precautions/Restrictions: Intact Precaution/Restrictions Comments: recalled 2/3, required cues for 3/3 Required Braces or Orthoses: Spinal Brace Spinal Brace: Lumbar corset, Applied in sitting position Restrictions Weight Bearing Restrictions Per Provider Order: No Other Position/Activity Restrictions: +orthostatics General:   Vital Signs:   Pain:   Mobility: Bed Mobility Bed Mobility: Rolling Right;Rolling Left;Supine to Sit;Sit to Supine Rolling Left: Independent Supine to Sit: Supervision/Verbal cueing Sit to Supine: Supervision/Verbal cueing Transfers Transfers: Sit to Stand;Stand Pivot Transfers Sit to Stand: Supervision/Verbal cueing Transfer (Assistive device): Rolling walker Locomotion : Gait Ambulation: Yes Gait Assistance: Contact Guard/Touching assist;Supervision/Verbal cueing Gait Distance (Feet): 150 Feet Assistive device: Rolling walker Gait Gait: Yes Gait Pattern: Impaired Gait Pattern: Step-through pattern;Narrow base of support;Poor foot clearance - right Gait velocity: decreasaed Stairs / Additional Locomotion Stairs: Yes Stairs Assistance: Contact Guard/Touching assist Stair Management Technique: Two rails Number of Stairs: 8 Height of Stairs: 6 Wheelchair Mobility Wheelchair Mobility: Yes Wheelchair Assistance: Set up Education officer, museum: Both upper extremities Wheelchair Parts Management: Needs assistance Distance: 174ft  Trunk/Postural Assessment : Cervical Assessment Cervical Assessment: Within Functional Limits Thoracic Assessment Thoracic Assessment: Within Functional Limits Lumbar Assessment Lumbar Assessment: Exceptions to [facility] Postural Control Postural Control: Within Functional Limits  Balance: Balance Balance Assessed: Yes Static Sitting Balance Static Sitting - Balance Support: Feet supported Static Sitting - Level of Assistance: 7: Independent Dynamic Sitting Balance Dynamic Sitting - Balance Support: Feet  supported Dynamic Sitting - Level of Assistance: 6: Modified independent (Device/Increase time) Static Standing Balance Static Standing - Balance Support: During functional activity Static Standing - Level of Assistance: 5: Stand by assistance Dynamic Standing Balance Dynamic Standing - Balance Support: During functional activity Dynamic Standing - Level of Assistance: 5: Stand by assistance Dynamic Standing - Balance Activities: Celeste Cola toss Exercises:   Other Treatments:      Therapy/Group: Individual Therapy  Rorie Delmore 04/29/2024, 12:35 PM

## 2024-04-29 NOTE — Progress Notes (Signed)
 Occupational Therapy Discharge Summary  Patient Details  Name: Bradley Fisher MRN: [id number] Date of Birth: 12/03/1966  Date of Discharge from OT service:Apr 29, 2024  Today's Date: 04/29/2024 OT Individual Time:0800-0915 & 4098-1191 OT Individual Time Calculation (min): 75 min & 45 min    Patient has met 8 of 8 long term goals due to improved activity tolerance, improved balance, postural control, and ability to compensate for deficits.  Patient to discharge at overall Supervision level.  Patient's care partner is independent to provide the necessary physical assistance at discharge.    Reasons goals not met: All goals met  Recommendation:  Patient will benefit from ongoing skilled OT services in home health setting to continue to advance functional skills in the area of BADL, iADL, and Reduce care partner burden.  Equipment: No equipment provided  Reasons for discharge: treatment goals met and discharge from hospital  Patient/family agrees with progress made and goals achieved: Yes  OT Discharge Precautions/Restrictions  Precautions Precautions: Back;Fall Recall of Precautions/Restrictions: Intact Precaution/Restrictions Comments: recalled 2/3, required cues for 3/3 Required Braces or Orthoses: Spinal Brace Spinal Brace: Lumbar corset;Applied in sitting position Restrictions Weight Bearing Restrictions Per Provider Order: No Pain Pain Assessment Pain Scale: 0-10 Pain Score: 8  Pain Type: Surgical pain Pain Location: Back Pain Orientation: Lower Pain Descriptors / Indicators: Aching Pain Onset: On-going Pain Intervention(s): Distraction;Emotional support;Repositioned;Ambulation/increased activity;Rest Multiple Pain Sites: No ADL ADL Eating: Set up Grooming: Supervision/safety Where Assessed-Grooming: Sitting at sink Upper Body Bathing: Supervision/safety Where Assessed-Upper Body Bathing: Sitting at sink Lower Body Bathing: Minimal assistance Where  Assessed-Lower Body Bathing: Sitting at sink Upper Body Dressing: Contact guard Where Assessed-Upper Body Dressing: Edge of bed Lower Body Dressing: Minimal assistance, Moderate assistance Where Assessed-Lower Body Dressing: Standing at sink, Sitting at sink Toileting: Minimal assistance Where Assessed-Toileting: Toilet, Bedside Commode Toilet Transfer: Minimal assistance Statistician Method: Stand pivot, Proofreader: Bedside commode, Grab bars Tub/Shower Transfer: Unable to assess Tub/Shower Transfer Method: Unable to assess Film/video editor: Unable to assess Film/video editor Method: Unable to assess ADL Comments: VC required for safety furing ADL task d/t wanting to use RW and furniture walk, 1 instance of retropusion with standing Vision Baseline Vision/History: 1 Wears glasses Patient Visual Report: No change from baseline Vision Assessment?: No apparent visual deficits Perception  Perception: Within Functional Limits Praxis Praxis: WFL Cognition Cognition Overall Cognitive Status: Within Functional Limits for tasks assessed Arousal/Alertness: Awake/alert Orientation Level: Person;Place;Situation Person: Oriented Situation: Oriented Memory: Appears intact Awareness: Appears intact Problem Solving: Appears intact Safety/Judgment: Impaired Comments: requiring cues to maintain back precautions Brief Interview for Mental Status (BIMS) Repetition of Three Words (First Attempt): 3 Temporal Orientation: Year: Correct Temporal Orientation: Month: Missed by 6 days to 1 month Temporal Orientation: Day: Correct Recall: "Sock": Yes, no cue required Recall: "Blue": Yes, no cue required Recall: "Bed": Yes, no cue required BIMS Summary Score: 14 Sensation Sensation Light Touch: Impaired by gross assessment (mostly numbness/tingling in feet) Hot/Cold: Appears Intact Proprioception: Appears Intact Stereognosis: Not  tested Coordination Gross Motor Movements are Fluid and Coordinated: No Fine Motor Movements are Fluid and Coordinated: Yes Coordination and Movement Description: limited by pain/fatigue Motor  Motor Motor: Other (comment) Motor - Skilled Clinical Observations: 2/2 weakness, overall improved since evaluation Mobility  Bed Mobility Bed Mobility: Rolling Right;Rolling Left;Supine to Sit;Sit to Supine Rolling Right: Supervision/verbal cueing Rolling Left: Independent Supine to Sit: Supervision/Verbal cueing Sit to Supine: Supervision/Verbal cueing Transfers Sit to Stand: Supervision/Verbal cueing  Trunk/Postural Assessment  Cervical Assessment Cervical Assessment: Within Functional Limits Thoracic Assessment Thoracic Assessment: Within Functional Limits Lumbar Assessment Lumbar Assessment: Within Functional Limits (LSO) Postural Control Postural Control: Within Functional Limits  Balance Balance Balance Assessed: Yes Static Sitting Balance Static Sitting - Balance Support: Feet supported Static Sitting - Level of Assistance: 7: Independent Dynamic Sitting Balance Dynamic Sitting - Balance Support: Feet supported Dynamic Sitting - Level of Assistance: 6: Modified independent (Device/Increase time) Static Standing Balance Static Standing - Balance Support: During functional activity Static Standing - Level of Assistance: 5: Stand by assistance Dynamic Standing Balance Dynamic Standing - Balance Support: During functional activity Dynamic Standing - Level of Assistance: 5: Stand by assistance Dynamic Standing - Balance Activities: Reaching for objects Extremity/Trunk Assessment RUE Assessment RUE Assessment: Within Functional Limits LUE Assessment LUE Assessment: Within Functional Limits  Session 1 General: "I can't wait to go home" Pt supine in bed upon OT arrival, agreeable to OT session.  Pain: see above  ADL: OT providing skilled intervention on ADL retraining in  order to increase independence with tasks and increase activity tolerance. Pt completed the following tasks at the current level of assist: Bed mobility: SBA from supine><EOB from semi flat bed  Grooming/oral hygiene: Toilet transfer: SBA with use of RW ambulating ~10 ft to toilet with no LOB Toileting: SBA, able to complete 3/3 steps of toileting without LOB UB dressing: mod I seated EOB to don/doff LSO and overhead shirt LB dressing: SBA overall without use of AE Footwear: total A to don TED hose and socks, OT educating pt on use of sock aide if wanting to private purchase once D/C Transfers: SBA overall with short bouts of ambulation throughout room.  Balance: OT providing skilled intervention for functional mobility and dynamic standing balance.  Pt ambulated throughout therapy gym with RW at SBA level to complete scavenger hunt to retrieve items. Pt retrieved with VC, 3/10 items required VC to locate.  Other Treatments: Pt able to use BUE to propel self to therapy gym in W/C at mod I. OT re-iterating back precautions in order for increased safety at D/C. OT also educated purpose of LSO and why it is important to continue to wear once D/C.    Pt supine in bed with bed alarm activated, 2 bed rails up, call light within reach and 4Ps assessed.   Session 2 General: "Nice working with you!" Pt supine in bed upon OT arrival, agreeable to OT session.  Pain:  8/10 pain reported in back, activity, intermittent rest breaks, distractions provided for pain management, pt reports tolerable to proceed.   Exercises: OT issued HEP in order to increase functional strength, andurance and activity tolerance in order to increase independence in ADLs such as bathing. Pt issued green theraband and discussed direction/technique of exercises, demonstrating verbal understanding. Pt completed 3x10 exercises listed below: -sit to stands with RW -seated marching with simultaneous arm swing for 30 sec -triceps  pull down with theraband -forward punches with theraband  Other Treatments: OT providing handout for energy conservation describing what it is, purpose of conserving energy and specific examples of how to conserve energy during ADL and IADL tasks. Pt appreciative for information with good carryover. OT also provided handout for pt for SBA level education fr wife in order to assist pt at current level for D/C, fall prevention and safety precautions. OT using multimodal education for patient.    Pt supine in bed with bed alarm activated, 2 bed rails up, call light within reach and 4Ps  assessed.   Nila Barth, OTD, OTR/L 04/29/2024, 4:38 PM

## 2024-04-30 ENCOUNTER — Other Ambulatory Visit (HOSPITAL_COMMUNITY): Payer: Self-pay

## 2024-04-30 LAB — CBC WITH DIFFERENTIAL/PLATELET
Abs Immature Granulocytes: 0.04 10*3/uL (ref 0.00–0.07)
Basophils Absolute: 0.1 10*3/uL (ref 0.0–0.1)
Basophils Relative: 1 %
Eosinophils Absolute: 0.1 10*3/uL (ref 0.0–0.5)
Eosinophils Relative: 1 %
HCT: 31.6 % — ABNORMAL LOW (ref 39.0–52.0)
Hemoglobin: 10.6 g/dL — ABNORMAL LOW (ref 13.0–17.0)
Immature Granulocytes: 1 %
Lymphocytes Relative: 19 %
Lymphs Abs: 1.2 10*3/uL (ref 0.7–4.0)
MCH: 29.2 pg (ref 26.0–34.0)
MCHC: 33.5 g/dL (ref 30.0–36.0)
MCV: 87.1 fL (ref 80.0–100.0)
Monocytes Absolute: 0.7 10*3/uL (ref 0.1–1.0)
Monocytes Relative: 10 %
Neutro Abs: 4.3 10*3/uL (ref 1.7–7.7)
Neutrophils Relative %: 68 %
Platelets: 447 10*3/uL — ABNORMAL HIGH (ref 150–400)
RBC: 3.63 MIL/uL — ABNORMAL LOW (ref 4.22–5.81)
RDW: 12.7 % (ref 11.5–15.5)
WBC: 6.4 10*3/uL (ref 4.0–10.5)
nRBC: 0 % (ref 0.0–0.2)

## 2024-04-30 LAB — COMPREHENSIVE METABOLIC PANEL WITH GFR
ALT: 25 U/L (ref 0–44)
AST: 27 U/L (ref 15–41)
Albumin: 3.3 g/dL — ABNORMAL LOW (ref 3.5–5.0)
Alkaline Phosphatase: 130 U/L — ABNORMAL HIGH (ref 38–126)
Anion gap: 12 (ref 5–15)
BUN: 20 mg/dL (ref 6–20)
CO2: 22 mmol/L (ref 22–32)
Calcium: 9.2 mg/dL (ref 8.9–10.3)
Chloride: 96 mmol/L — ABNORMAL LOW (ref 98–111)
Creatinine, Ser: 0.71 mg/dL (ref 0.61–1.24)
GFR, Estimated: >60 mL/min (ref >60)
Glucose, Bld: 101 mg/dL — ABNORMAL HIGH (ref 70–99)
Potassium: 4.4 mmol/L (ref 3.5–5.1)
Sodium: 130 mmol/L — ABNORMAL LOW (ref 135–145)
Total Bilirubin: 0.8 mg/dL (ref 0.0–1.2)
Total Protein: 6.5 g/dL (ref 6.5–8.1)

## 2024-04-30 MED ORDER — ALBUTEROL SULFATE HFA 108 (90 BASE) MCG/ACT IN AERS
2.0000 | INHALATION_SPRAY | Freq: Four times a day (QID) | RESPIRATORY_TRACT | 2 refills | Status: AC | PRN
Start: 2024-04-30 — End: ?
  Filled 2024-04-30: qty 6.7, 25d supply, fill #0

## 2024-04-30 MED ORDER — SERTRALINE HCL 100 MG PO TABS
100.0000 mg | ORAL_TABLET | Freq: Every day | ORAL | 0 refills | Status: AC
  Filled 2024-04-30: qty 30, 30d supply, fill #0

## 2024-04-30 MED ORDER — GABAPENTIN 400 MG PO CAPS
400.0000 mg | ORAL_CAPSULE | Freq: Three times a day (TID) | ORAL | 0 refills | Status: AC
  Filled 2024-04-30: qty 90, 30d supply, fill #0

## 2024-04-30 MED ORDER — HYDROCODONE-ACETAMINOPHEN 10-325 MG PO TABS
1.0000 | ORAL_TABLET | ORAL | 0 refills | Status: AC | PRN
  Filled 2024-04-30: qty 30, 5d supply, fill #0

## 2024-04-30 MED ORDER — ESOMEPRAZOLE MAGNESIUM 40 MG PO CPDR
40.0000 mg | DELAYED_RELEASE_CAPSULE | Freq: Every day | ORAL | 0 refills | Status: DC
  Filled 2024-04-30 (×2): qty 30, 30d supply, fill #0

## 2024-04-30 MED ORDER — ROSUVASTATIN CALCIUM 5 MG PO TABS
5.0000 mg | ORAL_TABLET | Freq: Every day | ORAL | 0 refills | Status: AC
  Filled 2024-04-30: qty 30, 30d supply, fill #0

## 2024-04-30 MED ORDER — METHOCARBAMOL 500 MG PO TABS
500.0000 mg | ORAL_TABLET | Freq: Four times a day (QID) | ORAL | 0 refills | Status: AC | PRN
  Filled 2024-04-30: qty 60, 15d supply, fill #0

## 2024-04-30 NOTE — Progress Notes (Signed)
 PROGRESS NOTE   Subjective/Complaints:  Pt switched back to Norco yesterday- said "doesn't make him feel as funny".  Explained will get 7 days of meds when leaves, then needs to f/u with his pain mgmt doctor.    Ready for d/c.  LBM 5/17- but doesn't want meds before he goes.      ROS:   Pt denies SOB, abd pain, CP, N/V/C/D, and vision changes     Objective:   No results found.  Recent Labs    04/30/24 0707  WBC 6.4  HGB 10.6*  HCT 31.6*  PLT 447*    Recent Labs    04/30/24 0707  NA 130*  K 4.4  CL 96*  CO2 22  GLUCOSE 101*  BUN 20  CREATININE 0.71  CALCIUM  9.2     Intake/Output Summary (Last 24 hours) at 04/30/2024 0827 Last data filed at 04/30/2024 0751 Gross per 24 hour  Intake 0 ml  Output 200 ml  Net -200 ml        Physical Exam: Vital Signs Blood pressure 131/72, pulse 73, temperature 98.6 F (37 C), temperature source Oral, resp. rate 18, height 6' (1.829 m), weight 92.4 kg, SpO2 99%.       General: awake, alert, appropriate, sitting up in bed; finished tray; NAD HENT: conjugate gaze; oropharynx moist CV: regular rate and rhythm; no JVD Pulmonary: CTA B/L; no W/R/R- good air movement GI: soft, NT, ND, (+)BS Psychiatric: appropriate- but frustrated- wants to leave ASAP Neurological: still mumbling- hard to understand Neurologic: Cranial nerves II through XII intact, motor strength is 5/5 in bilateral deltoid, bicep, tricep, grip, left hip flexor, knee extensors, ankle dorsiflexor and plantar flexor Lower extremity to minus hip flexion 3 - knee extension 4/5 ankle dorsiflexion plantarflexion Sensory exam reduced sensation right L3, right L4 dermatomal distribution Cerebellar exam normal finger to nose to finger  Musculoskeletal: Full range of motion upper extremities.  Lower extremity weakness limits active range of motion but is able to go passively.  No joint swelling     Assessment/Plan: 1. Functional deficits which require 3+ hours per day of interdisciplinary therapy in a comprehensive inpatient rehab setting. Physiatrist is providing close team supervision and 24 hour management of active medical problems listed below. Physiatrist and rehab team continue to assess barriers to discharge/monitor patient progress toward functional and medical goals  Care Tool:  Bathing              Bathing assist Assist Level: Supervision/Verbal cueing     Upper Body Dressing/Undressing Upper body dressing        Upper body assist Assist Level: Set up assist    Lower Body Dressing/Undressing Lower body dressing      What is the patient wearing?: Underwear/pull up, Pants     Lower body assist Assist for lower body dressing: Supervision/Verbal cueing     Toileting Toileting    Toileting assist Assist for toileting: Supervision/Verbal cueing     Transfers Chair/bed transfer  Transfers assist     Chair/bed transfer assist level: Supervision/Verbal cueing     Locomotion Ambulation   Ambulation assist      Assist level: Contact Guard/Touching assist  Assistive device: Walker-rolling Max distance: 192ft   Walk 10 feet activity   Assist     Assist level: Supervision/Verbal cueing Assistive device: Walker-rolling   Walk 50 feet activity   Assist Walk 50 feet with 2 turns activity did not occur: Safety/medical concerns  Assist level: Supervision/Verbal cueing Assistive device: Walker-rolling    Walk 150 feet activity   Assist Walk 150 feet activity did not occur: Safety/medical concerns  Assist level: Contact Guard/Touching assist Assistive device: Walker-rolling    Walk 10 feet on uneven surface  activity   Assist Walk 10 feet on uneven surfaces activity did not occur: Safety/medical concerns   Assist level: Contact Guard/Touching assist Assistive device: Walker-rolling   Wheelchair     Assist Is the  patient using a wheelchair?: Yes Type of Wheelchair: Manual    Wheelchair assist level: Set up assist Max wheelchair distance: 150    Wheelchair 50 feet with 2 turns activity    Assist    Wheelchair 50 feet with 2 turns activity did not occur: Refused   Assist Level: Supervision/Verbal cueing   Wheelchair 150 feet activity     Assist  Wheelchair 150 feet activity did not occur: Refused   Assist Level: Supervision/Verbal cueing   Blood pressure 131/72, pulse 73, temperature 98.6 F (37 C), temperature source Oral, resp. rate 18, height 6' (1.829 m), weight 92.4 kg, SpO2 99%.    Medical Problem List and Plan: 1. Functional deficits secondary to lumbar spondylosis/stenosis/radiculopathy status post L4-5, L5-S1 ALIF, L1-2, L3-4 DLIF, T10 to ilium posterior instrumented fusion 04/11/2024 and second stage 04/13/2024.  Back brace when out of bed applied in sitting position.             -patient may  shower             -ELOS/Goals: 7 to 10 days, supervision goals, patient states that someone told him he would go home on Friday but we discussed that discharge date would not be set until patient evaluated at rehab  5/19 d/c  Con't CIR PT and OT Pt Okd/c today  Getting RW and BSC delivered this AM  F/u with [facility] and pain physician, not me 2.  Antithrombotics: -DVT/anticoagulation:  Pharmaceutical: Lovenox .  Check vascular study 5/14- Dopplers (-)             -antiplatelet therapy: N/A 3. Pain Management: Neurontin  400 mg 3 times daily, Robaxin  and oxycodone  as needed Has been taking oxycodone  40 to 60 mg/day postoperatively.  Premorbidly he was on chronic hydrocodone .  5/13- Oxy q3 hours as needed- will not send on q3- not more than q4-6 at d/c. 5/14- taking ~ q4 hours? 5/15- taking frequently- will need to decrease dose to q4 hours prn- done   5/16- educated pt that would go home with only q6 hours prn pain meds, not q4 hours prn 5/17 Pain medications changed from oxycodone  to  hydrocodone  per patient request.  Discussed that all opioid pain medications can be addicting. 5/18 Pain ok with hydrocodone , he gets this at home but out of meds. Advised he call his pain clinic tomorrow to coordinate-do not want to violate his pain contract 4. Mood/Behavior/Sleep: Provide emotional support.  Resume Zoloft  100 mg daily             -antipsychotic agents: N/A 5. Neuropsych/cognition: This patient is capable of making decisions on his own behalf. 6. Skin/Wound Care: Routine skin checks  5/13- incision C/D/I 7. Fluids/Electrolytes/Nutrition: Routine in and outs with follow-up chemistries  8.  Acute blood loss anemia.  Follow-up CBC  5/13- Hb 9.4- down from 10- but prior was 9.2-will monitor 9.  Hypertension.  Norvasc  10 mg daily, Avapro  150 mg daily.  Monitor with increased mobility  5/13- 5/15- BP controlled so far- con't regimen  5/18 BP controlled-monitor      04/30/2024    4:33 AM 04/29/2024    7:39 PM 04/29/2024    1:53 PM  Vitals with BMI  Systolic 131 115 92  Diastolic 72 68 68  Pulse 73 81 74    10 hyperlipidemia.  Crestor  11.  COPD/tobacco use.  Continue nebulizer as needed.  Provide counseling regarding cessation of tobacco 12.  History of alcohol use disorder.  Provide counseling 13.  GERD.  Protonix   14. Hyponatremia  5/13- Na 128 this AM down from 134- will recheck in AM since dropped so abruptly, might be off- and if worse, then will evaluate more /treat.   5/14- they just restarted his Sertraline  100 mg daily- that's probably what dropped his Na- which has been OK in hospital, likely due to not being on Zoloft  in hospital until now- don't see any other reason for drop in Na- will check in AM- if drops more, will treat/stop Zoloft .   5/15- Na 130 today- will wait on treating since doing better  15. Constipation  5/15- LBM 2 days ago- not on bowel meds- just prn- will start Senna 2 tabs daily and Miralax  daily since on so much pain medicine- pt reluctant, but  explained his pain meds constipate him and taking pretty much max dose.   5/16- hasn't had a BM but pt mentioned doesn't want bowel meds- explained on a lot of pain meds which constipate patients, but he said he can go 4-6 days before BM at home- explained that's concerning, but he's "fine with it". - hasn't had BM since 5/13- but feels could go today. If no BM, needs sorbitol  tomorrow  -5/17 sorbitol  ordered 60ml  -5/18 LBM yesterday, monitor   5/19- LBM 2 days ago- doesn't want meds before leaves  The patient is medically ready for discharge to home and will not need follow-up with [facility]. In addition, they will need to follow up with their PCP, pain physician and Neurosurgery.    LOS: 7 days A FACE TO FACE EVALUATION WAS PERFORMED  Huy Majid 04/30/2024, 8:27 AM

## 2024-04-30 NOTE — Progress Notes (Signed)
 Inpatient Rehabilitation Discharge Medication Review by a Pharmacist  A complete drug regimen review was completed for this patient to identify any potential clinically significant medication issues.  High Risk Drug Classes Is patient taking? Indication by Medication  Antipsychotic No   Anticoagulant No   Antibiotic No   Opioid Yes Norco prn pain  Antiplatelet No   Hypoglycemics/insulin No   Vasoactive Medication Yes Azor  - BP  Chemotherapy No   Other Yes Nexium  - reflux Gabapentin  - pain Albuterol  prn SOB Rosuvastatin  - HLD Sertraline  - mood     Type of Medication Issue Identified Description of Issue Recommendation(s)  Drug Interaction(s) (clinically significant)     Duplicate Therapy     Allergy     No Medication Administration End Date     Incorrect Dose     Additional Drug Therapy Needed     Significant med changes from prior encounter (inform family/care partners about these prior to discharge).    Other       Clinically significant medication issues were identified that warrant physician communication and completion of prescribed/recommended actions by midnight of the next day:  No  Name of provider notified for urgent issues identified:   Provider Method of Notification:     Pharmacist comments: None  Time spent performing this drug regimen review (minutes):  20 minutes  Thank you. Lennice Quivers, PharmD

## 2024-04-30 NOTE — Progress Notes (Signed)
 Inpatient Rehabilitation Care Coordinator Discharge Note   Patient Details  Name: Bradley Fisher MRN: [id number] Date of Birth: [birth year]   Discharge location: HOME WITH WIFE WHO RECENTLY HAD STENTS PLACED-CAN PROVIDE SUPERVISION  Length of Stay: 7 DAYS  Discharge activity level: SUPERVISION LEVEL  Home/community participation: ACTIVE  Patient response YQ:MVHQIO Literacy - How often do you need to have someone help you when you read instructions, pamphlets, or other written material from your doctor or pharmacy?: Never  Patient response NG:EXBMWU Isolation - How often do you feel lonely or isolated from those around you?: Never  Services provided included: MD, RD, PT, OT, RN, CM, Pharmacy, SW  Financial Services:  Field seismologist Utilized: Private Insurance [facility] MEDICARE  Choices offered to/list presented to: PT AND WIFE  Follow-up services arranged:  Home Health, DME, Patient/Family has no preference for HH/DME agencies Home Health Agency: [facility]    DME : [facility] AND 3 IN 1    Patient response to transportation need: Is the patient able to respond to transportation needs?: Yes In the past 12 months, has lack of transportation kept you from medical appointments or from getting medications?: No In the past 12 months, has lack of transportation kept you from meetings, work, or from getting things needed for daily living?: No   Patient/Family verbalized understanding of follow-up arrangements:  Yes  Individual responsible for coordination of the follow-up plan: WANDA-WIFE [phone]  Confirmed correct DME delivered: Mardell Shade 04/30/2024    Comments (or additional information):PT DID WELL AND REACHED SUPERVISION LEVEL AT TIMES IS IMPULSIVE BUT WAS PTA  Summary of Stay    Date/Time Discharge Planning CSW  04/24/24 0853 New evaluaiton according to acute chart home with wife who can provide 24/7 assist. Will confirm today  RGD       Chella Chapdelaine G

## 2024-06-12 ENCOUNTER — Other Ambulatory Visit (HOSPITAL_COMMUNITY): Payer: Self-pay

## 2024-06-13 ENCOUNTER — Other Ambulatory Visit: Payer: Self-pay

## 2024-06-14 MED ORDER — ESOMEPRAZOLE MAGNESIUM 40 MG PO CPDR: 40.0000 mg | DELAYED_RELEASE_CAPSULE | Freq: Every day | ORAL | 0 refills | Status: AC

## 2024-07-02 NOTE — Progress Notes (Signed)
 Urbano Albright, MD  Physician Physical Medicine and Rehabilitation   Consult Note    Signed   Date of Service: 04/18/2024 12:45 PM  Related encounter: Admission (Discharged) from 04/11/2024 in [place] [facility]   Signed     Expand All Collapse All           Physical Medicine and Rehabilitation Consult Reason for Consult:Rehab Referring Physician: Dr. Debby     HPI: Bradley Fisher is a 58 y.o. male COPD, depression, seizures, hypertension, hyperlipidemia and chronic back pain who was admitted for lumbar surgery due to thoracolumbar stenosis/deformity.  He had posterior segmental fixation from T10 to pelvis completed on 04/13/2024 for lumbar radiculopathy, degenerative scoliosis.  Had JP drains now removed .  Continues to have right lower extremity numbness.  Patient noted to have orthostatic hypotension, very poor safety awareness.  He continues to have back pain, controlled with medications overall. Patient seen by PT and OT and found to have functional deficits and felt to be a candidate for intensive rehab program. PT reports wife will assist when he gets home 24/7.      Home: Home Living Family/patient expects to be discharged to:: Private residence Living Arrangements: Spouse/significant other Available Help at Discharge: Family, Available 24 hours/day Type of Home: House Home Access: Stairs to enter [employer] of Steps: 6 front, 3 in the back Entrance Stairs-Rails: Can reach both, Left, Right Home Layout: One level Bathroom Shower/Tub: Engineer, manufacturing systems: Standard Home Equipment: Hand held shower head, Shower seat  Functional History: Prior Function Prior Level of Function : Independent/Modified Independent Mobility Comments: Ind no AD ADLs Comments: ind Functional Status:  Mobility: Bed Mobility Overal bed mobility: Needs Assistance Bed Mobility: Rolling, Sidelying to Sit Rolling: Min assist Sidelying to sit: Mod  assist Sit to sidelying: Min assist General bed mobility comments: Min/Mod A for bed mobility with cues for sequencing to get trunk to mid line. Cues and reinforcement for log roll Transfers Overall transfer level: Needs assistance Equipment used: Rolling walker (2 wheels) Transfers: Sit to/from Stand Sit to Stand: Min assist, +2 safety/equipment, From elevated surface Bed to/from chair/wheelchair/BSC transfer type:: Step pivot Step pivot transfers: Mod assist, Max assist, +2 safety/equipment General transfer comment: Min A to stand from elevated bed. Pt reported dizziness during orthostatic check and requested to sit down. Ambulation deferred due to orthostatic. Ambulation/Gait Ambulation/Gait assistance: Max assist, Mod assist Gait Distance (Feet): 10 Feet Assistive device: Rolling walker (2 wheels) Gait Pattern/deviations: Step-to pattern, Decreased stance time - right, Decreased step length - right, Narrow base of support, Wide base of support, Trunk flexed General Gait Details: Ambulation deferred due to BP. Gait velocity: decreased Gait velocity interpretation: <1.31 ft/sec, indicative of household ambulator   ADL: ADL Overall ADL's : Needs assistance/impaired Eating/Feeding: Independent, Sitting Grooming: Sitting, Set up Upper Body Bathing: Sitting, Set up Lower Body Bathing: Moderate assistance, Sitting/lateral leans Upper Body Dressing : Sitting, Set up Lower Body Dressing: Sitting/lateral leans, Moderate assistance, With adaptive equipment Lower Body Dressing Details (indicate cue type and reason): Educated pt on use of shoe horn and and sock aid for LBD. Mod cues and assist for new learning. Toilet Transfer: Minimal assistance, +2 for physical assistance, Stand-pivot Toilet Transfer Details (indicate cue type and reason): limited activity restrictions per MD orders. only transfer to recliner. Toileting- Clothing Manipulation and Hygiene: Maximal assistance, Sit to/from  stand Functional mobility during ADLs: Minimal assistance, Rolling walker (2 wheels), +2 for physical assistance General ADL  Comments: Educated pt on POB precautions.   Cognition: Cognition Orientation Level: Oriented to person, Oriented to place, Disoriented to situation, Oriented to time Cognition Arousal: Alert Behavior During Therapy: [facility] for tasks assessed/performed     Review of Systems  Constitutional:  Negative for chills and fever.  HENT:  Negative for congestion.   Eyes:  Negative for blurred vision and double vision.  Respiratory:  Negative for shortness of breath.   Cardiovascular:  Negative for chest pain.  Gastrointestinal:  Positive for constipation. Negative for abdominal pain, nausea and vomiting.  Genitourinary: Negative.   Musculoskeletal:  Positive for back pain.  Skin:  Negative for rash.  Neurological:  Positive for sensory change and weakness.        Past Medical History:  Diagnosis Date   Arthritis     Asthma     COPD (chronic obstructive pulmonary disease) (HCC)     Depression     GERD (gastroesophageal reflux disease)     Hyperlipidemia     Hypertension     Seizures (HCC)      unknow etiology; on meds for this; no seizures since on meds 10 years ago             Past Surgical History:  Procedure Laterality Date   ABDOMINAL EXPOSURE N/A 04/11/2024    Procedure: ABDOMINAL EXPOSURE;  Surgeon: Gretta Lonni PARAS, MD;  Location: [facility] OR;  Service: Vascular;  Laterality: N/A;   ANTERIOR LAT LUMBAR FUSION Right 04/11/2024    Procedure: LUMBAR ONE-TWO, LUMBAR THREE-FOUR ANTERIOR LATERAL LUMBAR FUSION;  Surgeon: Debby Dorn MATSU, MD;  Location: [facility] OR;  Service: Neurosurgery;  Laterality: Right;  L12, L34 DLIF RT SIDE LATERAL DECUB   ANTERIOR LUMBAR FUSION Right 04/11/2024    Procedure: LUMBAR FOUR-FIVE, LUMBAR FIVE-SACRAL ONE ANTERIOR LUMBAR FUSION;  Surgeon: Debby Dorn MATSU, MD;  Location: [facility] OR;  Service: Neurosurgery;  Laterality: Right;  ALIF L45,  L5S1   APPLICATION OF CELL SAVER N/A 04/13/2024    Procedure: APPLICATION OF CELL SAVER;  Surgeon: Debby Dorn MATSU, MD;  Location: [facility] OR;  Service: Neurosurgery;  Laterality: N/A;   APPLICATION OF ROBOTIC ASSISTANCE FOR SPINAL PROCEDURE N/A 04/13/2024    Procedure: APPLICATION OF ROBOTIC ASSISTANCE FOR SPINAL PROCEDURE;  Surgeon: Debby Dorn MATSU, MD;  Location: [facility] OR;  Service: Neurosurgery;  Laterality: N/A;   COLONOSCOPY WITH PROPOFOL  N/A 05/29/2018    Procedure: COLONOSCOPY WITH PROPOFOL ;  Surgeon: Shaaron Lamar HERO, MD;  Location: [facility] SUITE;  Service: Endoscopy;  Laterality: N/A;  9:45am   KNEE ARTHROSCOPY WITH MEDIAL MENISECTOMY Left 11/11/2016    Procedure: LEFT KNEE ARTHROSCOPY WITH MEDIAL MENISECTOMY;  Surgeon: Taft FORBES Minerva, MD;  Location: [facility] ORS;  Service: Orthopedics;  Laterality: Left;   Thumb surgery-left   02/25/2020             Family History  Problem Relation Age of Onset   Hypertension Mother     Diabetes Mother     Hypertension Father     Diabetes Father     Diabetes Brother     Colon cancer Neg Hx          Social History:  reports that he has been smoking cigars. He started smoking about 51 years ago. He has been exposed to tobacco smoke. His smokeless tobacco use includes snuff. He reports current alcohol use of about 12.0 standard drinks of alcohol per week. He reports that he does not use drugs. Allergies:  Allergies  No Known Allergies  Medications Prior to Admission  Medication Sig Dispense Refill   amlodipine -olmesartan  (AZOR ) 10-20 MG tablet Take 1 tablet by mouth daily.       esomeprazole  (NEXIUM ) 40 MG capsule Take 40 mg by mouth daily.       gabapentin  (NEURONTIN ) 400 MG capsule Take 1 capsule (400 mg total) by mouth at bedtime. For PAIN (Patient taking differently: Take 400 mg by mouth 3 (three) times daily.) 90 capsule 1   HYDROcodone -acetaminophen  (NORCO) 10-325 MG tablet Take 1 tablet by mouth 2 (two) times daily as needed for severe  pain. (Patient taking differently: Take 1 tablet by mouth every 4 (four) hours as needed for severe pain (pain score 7-10).) 30 tablet 0   PROAIR  HFA 108 (90 Base) MCG/ACT inhaler Inhale 2 puffs into the lungs every 6 (six) hours as needed for wheezing or shortness of breath. 18 g 2   rosuvastatin  (CRESTOR ) 5 MG tablet Take 5 mg by mouth daily.       atorvastatin  (LIPITOR) 10 MG tablet TAKE 1 TABLET DAILY (Patient not taking: Reported on 03/27/2024) 90 tablet 3   cetirizine  (ZYRTEC ) 10 MG tablet Take 1 tablet (10 mg total) by mouth daily. (Patient not taking: Reported on 03/27/2024) 90 tablet 3   fluticasone  (FLONASE ) 50 MCG/ACT nasal spray Place 2 sprays into both nostrils daily. (Patient not taking: Reported on 03/27/2024) 16 g 6   meloxicam  (MOBIC ) 15 MG tablet Take 1 tablet (15 mg total) by mouth daily. (Patient not taking: Reported on 03/27/2024) 90 tablet 1   naloxone  (NARCAN ) nasal spray 4 mg/0.1 mL Place 1 spray into the nose.                Blood pressure 105/79, pulse 69, temperature 98.1 F (36.7 C), temperature source Oral, resp. rate 17, height 6' (1.829 m), weight 93 kg, SpO2 97%. Physical Exam   General: No apparent distress HEENT: Head is normocephalic, atraumatic, oral mucosa pink and moist, dentition decreased- says he has dentures, + glasses Neck: Supple without JVD or lymphadenopathy Heart: Reg rate and rhythm. No murmurs rubs or gallops Chest: CTA bilaterally without wheezes, rales, or rhonchi; no distress Abdomen: Soft, non-tender, non-distended, bowel sounds positive. Extremities: No clubbing, cyanosis, or edema. Pulses are 2+ Psych: Pt's affect is appropriate. Pt is cooperative Skin: Honeycomb dressings to spinal incisions CDI Neuro:     Mental Status: AAOx3, short-term memory deficits, decreased insight, poor safety awareness, distractable Speech/Languate: Naming and repetition intact, fluent-a little difficult to understand but this may be his baseline speech,  follows simple commands CRANIAL NERVES: 2 through 12 grossly intact     MOTOR: RUE: 5/5 Deltoid, 5/5 Biceps, 5/5 Triceps,5/5 Grip LUE: 5/5 Deltoid, 5/5 Biceps, 5/5 Triceps, 5/5 Grip   RLE: HF 4-/5, KE 4-/5, ADF 4+/5, APF 4+/5 LLE: HF 4/5, KE 4/5, ADF 5+/5, APF 4+/5   REFLEXES: No ankle clonus   SENSORY: Sensation altered in right lower extremity to light touch   Coordination: Normal finger to nose    R dorsal wrist IV in place     Lab Results Last 24 Hours  No results found for this or any previous visit (from the past 24 hours).   Imaging Results (Last 48 hours)  No results found.     Assessment/Plan: Diagnosis: Lumbar spondylosis/stenosis/radiculopathy s/p L4-5, L5-S1 ALIF, L1-2, L3-4 DLIF, T10-ilium posterior instrumented fusion  Does the need for close, 24 hr/day medical supervision in concert with the patient's rehab needs make it unreasonable for this patient to  be served in a less intensive setting? Yes Co-Morbidities requiring supervision/potential complications:  - Seizures, hypertension, GERD, depression, COPD, chronic back pain Due to bladder management, bowel management, safety, skin/wound care, disease management, medication administration, pain management, and patient education, does the patient require 24 hr/day rehab nursing? Yes Does the patient require coordinated care of a physician, rehab nurse, therapy disciplines of PT/OT to address physical and functional deficits in the context of the above medical diagnosis(es)? Yes Addressing deficits in the following areas: balance, endurance, locomotion, strength, transferring, bowel/bladder control, bathing, dressing, feeding, grooming, toileting, cognition, and psychosocial support Can the patient actively participate in an intensive therapy program of at least 3 hrs of therapy per day at least 5 days per week? Yes The potential for patient to make measurable gains while on inpatient rehab is excellent Anticipated  functional outcomes upon discharge from inpatient rehab are modified independent and supervision  with PT, modified independent and supervision with OT, modified independent and supervision with SLP. Estimated rehab length of stay to reach the above functional goals is: 7-10 Anticipated discharge destination: Home Overall Rehab/Functional Prognosis: excellent   POST ACUTE RECOMMENDATIONS: This patient's condition is appropriate for continued rehabilitative care in the following setting: CIR Patient has agreed to participate in recommended program. Yes Note that insurance prior authorization may be required for reimbursement for recommended care.   Comment: I think patient would be a good candidate for CIR.  Rehab coordinator to follow-up.     MEDICAL RECOMMENDATIONS: Consider additional medication for constipation. Could try sorbitol .  2.   Consider lower extremity compression/ encouraging fluids for orthostatic hypotension. Could try salt tabs also.      I have personally performed a face to face diagnostic evaluation of this patient. Additionally, I have examined the patient's medical record including any pertinent labs and radiographic images.     Thanks,   Murray Collier, MD 04/18/2024           Routing History

## 2024-07-02 NOTE — Progress Notes (Signed)
 Carilyn Prentice BRAVO, MD  Physician Physical Medicine and Rehabilitation   PMR Pre-admission    Signed   Date of Service: 04/20/2024 11:16 AM  Related encounter: Admission (Discharged) from 04/11/2024 in [facility]   Signed     Expand All Collapse All  Show:Clear all [x] Written[x] Templated[] Copied  Added by: [x] Mckyle Solanki E, PT  [] Hover for details PMR Admission Coordinator Pre-Admission Assessment   Patient: Bradley Fisher is an 58 y.o., male MRN: [id number] DOB: 1966-03-06 Height: 6' (182.9 cm) Weight: 93 kg                                                                                                                                                  Insurance Information HMO: yes    PPO:      PCP:      IPA:      80/20:      OTHER:  PRIMARY: U[employer] Medicare Dual Complete      Policy#: [id number]      Subscriber: pt CM Name: Stacia      Phone#: [phone]     Fax#: [phone] Pre-Cert#: [id number] auth for CIR following P2P with Dr. Lovorn with updates due to fax listed above on 5/16      Employer:  Benefits:  Phone #: [phone]     Name:  Eff. Date: 01/14/24     Deduct: $257 (met)      Out of Pocket Max: [phone] (met $813.30)      Life Max:   CIR: $1610/admit      SNF: 20 full days Outpatient: 80%     Co-Pay: 20% Home Health: 100%      Co-Pay:  DME: 80%     Co-Pay: 20% Providers:  SECONDARY: Medicaid inactive      Policy#:       Phone#:    Artist:       Phone#:    The Data processing manager" for patients in Inpatient Rehabilitation Facilities with attached "Privacy Act Statement-Health Care Records" was provided and verbally reviewed with: Patient and Family   Emergency Contact Information Contact Information       Name Relation Home Work Mobile    [place] Spouse [phone]   [phone]    Mabe,Cathy Sister [phone]   [phone]         Other Contacts   None on File      Current Medical  History  Patient Admitting Diagnosis: functional deficits following anterior retroperitoneal spine exposure for L4-S1 ALIF   History of Present Illness:  Pt is a 58 y.o. male COPD, depression, seizures, hypertension, hyperlipidemia and chronic back pain who was admitted for lumbar surgery due to thoracolumbar stenosis/deformity.  He had posterior segmental fixation from T10 to pelvis completed on 04/13/2024 for lumbar radiculopathy, degenerative scoliosis.  Had JP drains  now removed .  Continues to have right lower extremity numbness.  Patient noted to have orthostatic hypotension, very poor safety awareness.  He continues to have back pain, controlled with medications overall. Patient seen by PT and OT and found to have functional deficits and felt to be a candidate for intensive rehab program. PT reports wife will assist when he gets home 24/7.    Glasgow Coma Scale Score: 15   Patient's medical record from [facility] has been reviewed by the rehabilitation admission coordinator and physician.   Past Medical History      Past Medical History:  Diagnosis Date   Arthritis     Asthma     COPD (chronic obstructive pulmonary disease) (HCC)     Depression     GERD (gastroesophageal reflux disease)     Hyperlipidemia     Hypertension     Seizures (HCC)      unknow etiology; on meds for this; no seizures since on meds 10 years ago          Has the patient had major surgery during 100 days prior to admission? Yes   Family History  family history includes Diabetes in his brother, father, and mother; Hypertension in his father and mother.     Current Medications   Current Medications    Current Facility-Administered Medications:    acetaminophen  (TYLENOL ) tablet 650 mg, 650 mg, Oral, Q4H PRN, 650 mg at 04/21/24 0854 **OR** acetaminophen  (TYLENOL ) suppository 650 mg, 650 mg, Rectal, Q4H PRN, Johnanna Credit Caylin, PA-C   albuterol  (PROVENTIL ) (2.5 MG/3ML) 0.083% nebulizer solution 2.5 mg,  2.5 mg, Nebulization, Q6H PRN, Tomlinson, Sara Caylin, PA-C   amLODipine  (NORVASC ) tablet 10 mg, 10 mg, Oral, Daily, Thomas, Jonathan G, MD, 10 mg at 04/23/24 9164   bacitracin  ointment, , Topical, BID, Debby Dorn MATSU, MD, 31.5 Application at 04/23/24 0836   docusate sodium  (COLACE) capsule 100 mg, 100 mg, Oral, BID, Johnanna Credit Caylin, PA-C, 100 mg at 04/23/24 0835   enoxaparin  (LOVENOX ) injection 40 mg, 40 mg, Subcutaneous, Q24H, Johnanna Credit Caylin, PA-C, 40 mg at 04/23/24 9164   gabapentin  (NEURONTIN ) capsule 400 mg, 400 mg, Oral, TID, Johnanna Credit Caylin, PA-C, 400 mg at 04/23/24 9164   irbesartan  (AVAPRO ) tablet 150 mg, 150 mg, Oral, Daily, Thomas, Jonathan G, MD, 150 mg at 04/23/24 0835   magnesium  citrate solution 1 Bottle, 1 Bottle, Oral, Once, Debby Dorn MATSU, MD   menthol -cetylpyridinium (CEPACOL) lozenge 3 mg, 1 lozenge, Oral, PRN **OR** phenol (CHLORASEPTIC) mouth spray 1 spray, 1 spray, Mouth/Throat, PRN, Johnanna Credit Caylin, PA-C   methocarbamol  (ROBAXIN ) tablet 500 mg, 500 mg, Oral, Q6H PRN, 500 mg at 04/22/24 2037 **OR** methocarbamol  (ROBAXIN ) injection 500 mg, 500 mg, Intravenous, Q6H PRN, Johnanna Credit Caylin, PA-C, 500 mg at 04/13/24 1552   morphine  (PF) 2 MG/ML injection 2 mg, 2 mg, Intravenous, Q3H PRN, Tomlinson, Sara Caylin, PA-C, 2 mg at 04/13/24 9487   ondansetron  (ZOFRAN ) tablet 4 mg, 4 mg, Oral, Q6H PRN **OR** ondansetron  (ZOFRAN ) injection 4 mg, 4 mg, Intravenous, Q6H PRN, Tomlinson, Sara Caylin, PA-C   Oral care mouth rinse, 15 mL, Mouth Rinse, PRN, Debby Dorn MATSU, MD   oxyCODONE  (Oxy IR/ROXICODONE ) immediate release tablet 10 mg, 10 mg, Oral, Q3H PRN, Johnanna Credit Caylin, PA-C, 10 mg at 04/23/24 0413   oxyCODONE  (Oxy IR/ROXICODONE ) immediate release tablet 5 mg, 5 mg, Oral, Q3H PRN, Tomlinson, Sara Caylin, PA-C   pantoprazole  (PROTONIX ) EC tablet 80 mg, 80 mg, Oral,  [id number], Uynfjd, Gnwjuyjw H, MD, 80 mg at 04/23/24 9164   polyethylene glycol  (MIRALAX  / GLYCOLAX ) packet 17 g, 17 g, Oral, Daily PRN, Tomlinson, Sara Caylin, PA-C, 17 g at 04/22/24 2037   rosuvastatin  (CRESTOR ) tablet 5 mg, 5 mg, Oral, Daily, Johnanna Credit Caylin, PA-C, 5 mg at 04/23/24 9164   sodium chloride  flush (NS) 0.9 % injection 3 mL, 3 mL, Intravenous, Q12H, Johnanna Credit Caylin, PA-C, 3 mL at 04/22/24 2038   sodium chloride  flush (NS) 0.9 % injection 3 mL, 3 mL, Intravenous, PRN, Tomlinson, Sara Caylin, PA-C   sodium chloride  flush (NS) 0.9 % injection 3 mL, 3 mL, Intravenous, Q12H, Johnanna Credit Caylin, PA-C, 3 mL at 04/23/24 9163   sodium chloride  flush (NS) 0.9 % injection 3 mL, 3 mL, Intravenous, PRN, Tomlinson, Sara Caylin, PA-C   sodium phosphate  (FLEET) enema 1 enema, 1 enema, Rectal, Once PRN, Tomlinson, Sara Caylin, PA-C     Patients Current Diet:  Diet Order                  Diet regular Room service appropriate? Yes; Fluid consistency: Thin  Diet effective now                         Precautions / Restrictions Precautions Precautions: Back Precaution Booklet Issued: No Precaution/Restrictions Comments: pt able to report 1/3 precautions, reviewed verbally Spinal Brace: Lumbar corset, Applied in sitting position Restrictions Weight Bearing Restrictions Per Provider Order: No Other Position/Activity Restrictions: +orthostatics    Has the patient had 2 or more falls or a fall with injury in the past year?Yes   Prior Activity Level Community (5-7x/wk): independent without DME, on disability, driving   Prior Functional Level Prior Function Prior Level of Function : Independent/Modified Independent Mobility Comments: Ind no AD ADLs Comments: ind   Self Care: Did the patient need help bathing, dressing, using the toilet or eating?  Independent   Indoor Mobility: Did the patient need assistance with walking from room to room (with or without device)? Independent   Stairs: Did the patient need assistance with internal or  external stairs (with or without device)? Independent   Functional Cognition: Did the patient need help planning regular tasks such as shopping or remembering to take medications? Independent   Patient Information Are you of Hispanic, Latino/a,or Spanish origin?: A. No, not of Hispanic, Latino/a, or Spanish origin What is your race?: A. White Do you need or want an interpreter to communicate with a doctor or health care staff?: 0. No   Patient's Response To:  Health Literacy and Transportation Is the patient able to respond to health literacy and transportation needs?: Yes Health Literacy - How often do you need to have someone help you when you read instructions, pamphlets, or other written material from your doctor or pharmacy?: Never In the past 12 months, has lack of transportation kept you from medical appointments or from getting medications?: No In the past 12 months, has lack of transportation kept you from meetings, work, or from getting things needed for daily living?: No   Home Assistive Devices / Equipment Home Equipment: Hand held shower head, Shower seat   Prior Device Use: Indicate devices/aids used by the patient prior to current illness, exacerbation or injury? None of the above   Current Functional Level Cognition   Orientation Level: Oriented X4    Extremity Assessment (includes Sensation/Coordination)   Upper Extremity Assessment: Overall WFL for tasks assessed  Lower  Extremity Assessment: RLE deficits/detail RLE Sensation: decreased light touch RLE Coordination: decreased gross motor     ADLs   Overall ADL's : Needs assistance/impaired Eating/Feeding: Independent, Sitting Grooming: Sitting, Set up Upper Body Bathing: Sitting, Set up Lower Body Bathing: Moderate assistance, Sitting/lateral leans Upper Body Dressing : Sitting, Set up Lower Body Dressing: Sitting/lateral leans, Moderate assistance, With adaptive equipment Lower Body Dressing Details (indicate  cue type and reason): Educated pt on use of shoe horn and and sock aid for LBD. Mod cues and assist for new learning. Toilet Transfer: Minimal assistance, +2 for physical assistance, Stand-pivot Toilet Transfer Details (indicate cue type and reason): limited activity restrictions per MD orders. only transfer to recliner. Toileting- Clothing Manipulation and Hygiene: Maximal assistance, Sit to/from stand Functional mobility during ADLs: Rolling walker (2 wheels), Minimal assistance General ADL Comments: Pt declined ADLs, pt open to working on OOB mobility-used chair follow since pt is +orthostatic     Mobility   Overal bed mobility: Needs Assistance Bed Mobility: Rolling, Sidelying to Sit, Sit to Sidelying Rolling: Supervision Sidelying to sit: Contact guard assist Sit to sidelying: Min assist (assist mostly with right leg) General bed mobility comments: pt able to report log roll.  Most assist to come to sitting back to supine is for R leg     Transfers   Overall transfer level: Needs assistance Equipment used: Rolling walker (2 wheels) Transfers: Sit to/from Stand, Bed to chair/wheelchair/BSC Sit to Stand: Contact guard assist Bed to/from chair/wheelchair/BSC transfer type:: Step pivot Step pivot transfers: Min assist General transfer comment: pt having difficulty progressing right leg/foot during transfers, min assist to stabilize RW and support trunk     Ambulation / Gait / Stairs / Wheelchair Mobility   Ambulation/Gait Ambulation/Gait assistance: Min assist, +2 safety/equipment Gait Distance (Feet): 15 Feet Assistive device: Rolling walker (2 wheels) Gait Pattern/deviations: Step-through pattern, Steppage, Decreased dorsiflexion - right, Decreased stance time - right General Gait Details: chair to follow due to continued orthostatic BPs and symptoms.  Pt also go nauseated in the hallway, burped, has not had a BM yet despite attempting with us  at end of session  Refused  laxative. Gait velocity: decreased Gait velocity interpretation: <1.31 ft/sec, indicative of household ambulator     Posture / Balance Balance Overall balance assessment: Needs assistance Sitting-balance support: Feet supported, No upper extremity supported Sitting balance-Leahy Scale: Good Standing balance support: Bilateral upper extremity supported Standing balance-Leahy Scale: Poor Standing balance comment: Reliant on both hands     Special needs/care consideration Skin surgical incision        Previous Home Environment (from acute therapy documentation) Living Arrangements: Spouse/significant other Available Help at Discharge: Family, Available 24 hours/day Type of Home: House Home Layout: One level Home Access: Stairs to enter Entrance Stairs-Rails: Can reach both, Left, Right Entrance Stairs-Number of Steps: 6 front, 3 in the back Bathroom Shower/Tub: Engineer, manufacturing systems: Standard Home Care Services: No   Discharge Living Setting Plans for Discharge Living Setting: Patient's home, Lives with (comment) (spouse) Type of Home at Discharge: Mobile home Discharge Home Layout: One level Discharge Home Access: Stairs to enter Entrance Stairs-Rails: Right, Left Entrance Stairs-Number of Steps: 5-6 Discharge Bathroom Shower/Tub: Tub/shower unit Discharge Bathroom Toilet: Standard Discharge Bathroom Accessibility: Yes How Accessible: Accessible via walker Does the patient have any problems obtaining your medications?: No   Social/Family/Support Systems Patient Roles: Spouse Anticipated Caregiver: Apolinar Anticipated Caregiver's Contact Information: [phone] Ability/Limitations of Caregiver: none stated Caregiver Availability: 24/7 Discharge Plan Discussed with  Primary Caregiver: Yes Is Caregiver In Agreement with Plan?: Yes Does Caregiver/Family have Issues with Lodging/Transportation while Pt is in Rehab?: No     Goals Patient/Family Goal for Rehab:  PT/OT supervision, SLP n/a Expected length of stay: 7-10 days Additional Information: Discharge plan: home with spouse 24/7. Pt/Family Agrees to Admission and willing to participate: Yes Program Orientation Provided & Reviewed with Pt/Caregiver Including Roles  & Responsibilities: Yes     Decrease burden of Care through IP rehab admission: n/a     Possible need for SNF placement upon discharge: Not anticipated.  Plan for d/c home with spouse to provide 24/7 supervision.      Patient Condition: This patient's condition remains as documented in the consult dated 04/18/24, in which the Rehabilitation Physician determined and documented that the patient's condition is appropriate for intensive rehabilitative care in an inpatient rehabilitation facility. Will admit to inpatient rehab today.   Preadmission Screen Completed By:  Reche FORBES Lowers, PT, DPT 04/23/2024 9:26 AM ______________________________________________________________________   Discussed status with Dr. Carilyn on 04/23/24 at  9:26 AM  and received approval for admission today.   Admission Coordinator:  Cherre Kothari E Vaiden Adames, PT, DPT time 9:26 AM Pattricia 04/23/24              Revision History

## 2024-10-03 ENCOUNTER — Other Ambulatory Visit: Payer: Self-pay

## 2024-10-03 ENCOUNTER — Encounter: Payer: Self-pay | Admitting: Physical Therapy

## 2024-10-03 ENCOUNTER — Ambulatory Visit: Attending: Surgery | Admitting: Physical Therapy

## 2024-10-03 DIAGNOSIS — M5459 Other low back pain: Secondary | ICD-10-CM | POA: Insufficient documentation

## 2024-10-03 DIAGNOSIS — M62830 Muscle spasm of back: Secondary | ICD-10-CM | POA: Diagnosis present

## 2024-10-03 DIAGNOSIS — M6281 Muscle weakness (generalized): Secondary | ICD-10-CM | POA: Insufficient documentation

## 2024-10-03 NOTE — Therapy (Signed)
 OUTPATIENT PHYSICAL THERAPY THORACOLUMBAR EVALUATION   Patient Name: Bradley Fisher MRN: [id number] DOB:06-07-1966, 58 y.o., male Today's Date: 10/03/2024  END OF SESSION:  PT End of Session - 10/03/24 1327     Visit Number 1    Number of Visits 12    Date for Recertification  11/14/24    PT Start Time 1153    PT Stop Time 1242    PT Time Calculation (min) 49 min    Activity Tolerance Patient tolerated treatment well    Behavior During Therapy WFL for tasks assessed/performed          Past Medical History:  Diagnosis Date   Arthritis    Asthma    COPD (chronic obstructive pulmonary disease) (HCC)    Depression    GERD (gastroesophageal reflux disease)    Hyperlipidemia    Hypertension    Seizures (HCC)    unknow etiology; on meds for this; no seizures since on meds 10 years ago   Past Surgical History:  Procedure Laterality Date   ABDOMINAL EXPOSURE N/A 04/11/2024   Procedure: ABDOMINAL EXPOSURE;  Surgeon: Gretta Lonni JINNY, MD;  Location: [facility] OR;  Service: Vascular;  Laterality: N/A;   ANTERIOR LAT LUMBAR FUSION Right 04/11/2024   Procedure: LUMBAR ONE-TWO, LUMBAR THREE-FOUR ANTERIOR LATERAL LUMBAR FUSION;  Surgeon: Debby Dorn MATSU, MD;  Location: [facility] OR;  Service: Neurosurgery;  Laterality: Right;  L12, L34 DLIF RT SIDE LATERAL DECUB   ANTERIOR LUMBAR FUSION Right 04/11/2024   Procedure: LUMBAR FOUR-FIVE, LUMBAR FIVE-SACRAL ONE ANTERIOR LUMBAR FUSION;  Surgeon: Debby Dorn MATSU, MD;  Location: [facility] OR;  Service: Neurosurgery;  Laterality: Right;  ALIF L45, L5S1   APPLICATION OF CELL SAVER N/A 04/13/2024   Procedure: APPLICATION OF CELL SAVER;  Surgeon: Debby Dorn MATSU, MD;  Location: [facility] OR;  Service: Neurosurgery;  Laterality: N/A;   APPLICATION OF ROBOTIC ASSISTANCE FOR SPINAL PROCEDURE N/A 04/13/2024   Procedure: APPLICATION OF ROBOTIC ASSISTANCE FOR SPINAL PROCEDURE;  Surgeon: Debby Dorn MATSU, MD;  Location: [facility] OR;  Service: Neurosurgery;  Laterality: N/A;    COLONOSCOPY WITH PROPOFOL  N/A 05/29/2018   Procedure: COLONOSCOPY WITH PROPOFOL ;  Surgeon: Shaaron Lamar HERO, MD;  Location: [facility] ENDO SUITE;  Service: Endoscopy;  Laterality: N/A;  9:45am   KNEE ARTHROSCOPY WITH MEDIAL MENISECTOMY Left 11/11/2016   Procedure: LEFT KNEE ARTHROSCOPY WITH MEDIAL MENISECTOMY;  Surgeon: Taft FORBES Minerva, MD;  Location: [facility] ORS;  Service: Orthopedics;  Laterality: Left;   Thumb surgery-left  02/25/2020   Patient Active Problem List   Diagnosis Date Noted   Lumbar radiculopathy 04/11/2024   Scoliosis deformity of spine 03/02/2024   Abscess 05/20/2021   Tobacco use 01/22/2021   Gastroesophageal reflux disease without esophagitis 08/17/2017   Chronic seasonal allergic rhinitis due to pollen 03/09/2017   Depression, recurrent 02/11/2017   Generalized OA 10/27/2016   Chronic midline low back pain without sciatica 10/27/2016   Hypertension, essential 10/27/2016   History of seizures 10/27/2016   REFERRING PROVIDER: Camie Pickle  REFERRING DIAG: Other forms of scoliosis, thoracolumbar region.    Rationale for Evaluation and Treatment: Rehabilitation  THERAPY DIAG:  Other low back pain  Muscle weakness (generalized)  Muscle spasm of back  ONSET DATE: Ongoing, surgery (04/13/24).    SUBJECTIVE:  SUBJECTIVE STATEMENT: The patient presents to the clinic s/p T10 to ilium fusion in May of this year.  He continues to wear a back brace.  He was without his cane today though is is highly recommended he use at this time.  He also has a walking stick.  He states the surgery helped his left LE but he is experiencing numbness over his right LE.     PERTINENT HISTORY:  Please see above.    PAIN:  Are you having pain? Yes: NPRS scale: 5-6/0.   Pain location: LB.   Pain description:  Ache, numbness over right LE.   Aggravating factors: Increased activity. Relieving factors: Rest.    PRECAUTIONS: Other: Please monitor gait.    RED FLAGS: None   WEIGHT BEARING RESTRICTIONS: No  FALLS:  Has patient fallen in last 6 months? No.  He reports no falls but as been close to falling.    LIVING ENVIRONMENT: Lives with: lives with their spouse Lives in: House/apartment Has following equipment at home: None.  Recommended he use his cane at all times.    PLOF: Independent with basic ADLs  PATIENT GOALS: Reduce pain and numbness, get stronger.      OBJECTIVE:    PATIENT SURVEYS:  ODI:  32/50.   POSTURE: rounded shoulders and forward head  PALPATION: Patient c/o diffuse bilateral lumbar pain.     LOWER EXTREMITY ROM:     WNL.  LOWER EXTREMITY MMT:    Right hip flexion and knee extension is 3-/5, right hamstrings 4-/5, right hip abduction is 4/5, right ankle 4+/5.    FUNCTIONAL TESTS:  5 times sit to stand: 25 seconds Timed up and go (TUG): 15 seconds.    GAIT: The patient's gait is slow and purposeful with a wide BOS and increased right LE toe out.  TREATMENT DATE:   10/03/24:  Nustep on level 2 x 15 minutes (LE's only) f/b Rockerboard with UE support x 4 minutes f/b seated hip adductor (ball squeezes x 2 minutes) f/b seated hip abduction with red theraband x 2 minutes.                                                                                                                              PATIENT EDUCATION:  Education details: Discussed using cane for safety. He has a cane and walking stick.    Person educated: Patient Education method: Explanation Education comprehension: verbalized understanding  HOME EXERCISE PROGRAM:   ASSESSMENT:  CLINICAL IMPRESSION: The patient presents to [facility]PP[facility] s/p T-L fusion surgery performed in May of this year.  Is CC is numbness over his right LE.  He has notable weakness.  His TUG was performed in 25 seconds  and 5 time sit to stand performed in 15 seconds.  His gait isslow and purposeful with a wide BOS and increased right LE toe out.  He c/o diffuse lumbar pain.  Patient will benefit from skilled physical therapy intervention  to address pain and deficits.  OBJECTIVE IMPAIRMENTS: cardiopulmonary status limiting activity, decreased activity tolerance, decreased strength, increased muscle spasms, and pain.   ACTIVITY LIMITATIONS: carrying, lifting, bending, and locomotion level  PARTICIPATION LIMITATIONS: meal prep, cleaning, laundry, driving, shopping, community activity, and yard work  PERSONAL FACTORS: Time since onset of injury/illness/exacerbation are also affecting patient's functional outcome.   REHAB POTENTIAL: Good  CLINICAL DECISION MAKING: Stable/uncomplicated  EVALUATION COMPLEXITY: Low   GOALS:  SHORT TERM GOALS: Target date: 10/16/24  Ind with a HEP. Goal status: INITIAL   LONG TERM GOALS: Target date: 11/14/24  Ind with an advanced HEP.  Goal status: INITIAL  2.  Improve 5 times sit to stand test by 5 seconds.  Goal status: INITIAL  3.  Patient walk 500 feet in clinic with straight cane without LOB.  Goal status: INITIAL  4.  Improve right LE strength by 1 muscle grade to allow for greater ease of performing functional tasks.    Goal status: INITIAL  5.  Perform ADL's with pain not > 4/10.  Goal status: INITIAL  PLAN:  PT FREQUENCY: 2x/week  PT DURATION: 6 weeks  PLANNED INTERVENTIONS: 97110-Therapeutic exercises, 97530- Therapeutic activity, [id number]- Neuromuscular re-education, 97535- Self Care, 02859- Manual therapy, G0283- Electrical stimulation (unattended), 97035- Ultrasound, Patient/Family education, and Moist heat.  PLAN FOR NEXT SESSION: Right LE strengthening, gait activities, Core exercise progression.  Body mechanics training and spinal protection techniques.  Modalities and STW/M as needed.     Banjamin Stovall, [place], PT 10/03/2024, 1:59 PM

## 2024-10-09 ENCOUNTER — Ambulatory Visit: Admitting: Physical Therapy

## 2024-10-09 DIAGNOSIS — M5459 Other low back pain: Secondary | ICD-10-CM | POA: Diagnosis not present

## 2024-10-09 NOTE — Therapy (Signed)
 OUTPATIENT PHYSICAL THERAPY THORACOLUMBAR TREATMENT   Patient Name: Bradley Fisher MRN: [id number] DOB:01-16-1966, 58 y.o., male Today's Date: 10/09/2024  END OF SESSION:  PT End of Session - 10/09/24 0958     Visit Number 2    Number of Visits 12    Date for Recertification  11/14/24    PT Start Time 0929    PT Stop Time 1013    PT Time Calculation (min) 44 min    Activity Tolerance Patient tolerated treatment well    Behavior During Therapy [facility] for tasks assessed/performed           Past Medical History:  Diagnosis Date   Arthritis    Asthma    COPD (chronic obstructive pulmonary disease) (HCC)    Depression    GERD (gastroesophageal reflux disease)    Hyperlipidemia    Hypertension    Seizures (HCC)    unknow etiology; on meds for this; no seizures since on meds 10 years ago   Past Surgical History:  Procedure Laterality Date   ABDOMINAL EXPOSURE N/A 04/11/2024   Procedure: ABDOMINAL EXPOSURE;  Surgeon: Gretta Lonni JINNY, MD;  Location: [facility] OR;  Service: Vascular;  Laterality: N/A;   ANTERIOR LAT LUMBAR FUSION Right 04/11/2024   Procedure: LUMBAR ONE-TWO, LUMBAR THREE-FOUR ANTERIOR LATERAL LUMBAR FUSION;  Surgeon: Debby Dorn MATSU, MD;  Location: [facility] OR;  Service: Neurosurgery;  Laterality: Right;  L12, L34 DLIF RT SIDE LATERAL DECUB   ANTERIOR LUMBAR FUSION Right 04/11/2024   Procedure: LUMBAR FOUR-FIVE, LUMBAR FIVE-SACRAL ONE ANTERIOR LUMBAR FUSION;  Surgeon: Debby Dorn MATSU, MD;  Location: [facility] OR;  Service: Neurosurgery;  Laterality: Right;  ALIF L45, L5S1   APPLICATION OF CELL SAVER N/A 04/13/2024   Procedure: APPLICATION OF CELL SAVER;  Surgeon: Debby Dorn MATSU, MD;  Location: [facility] OR;  Service: Neurosurgery;  Laterality: N/A;   APPLICATION OF ROBOTIC ASSISTANCE FOR SPINAL PROCEDURE N/A 04/13/2024   Procedure: APPLICATION OF ROBOTIC ASSISTANCE FOR SPINAL PROCEDURE;  Surgeon: Debby Dorn MATSU, MD;  Location: [facility] OR;  Service: Neurosurgery;  Laterality: N/A;    COLONOSCOPY WITH PROPOFOL  N/A 05/29/2018   Procedure: COLONOSCOPY WITH PROPOFOL ;  Surgeon: Shaaron Lamar HERO, MD;  Location: [facility];  Service: Endoscopy;  Laterality: N/A;  9:45am   KNEE ARTHROSCOPY WITH MEDIAL MENISECTOMY Left 11/11/2016   Procedure: LEFT KNEE ARTHROSCOPY WITH MEDIAL MENISECTOMY;  Surgeon: Taft FORBES Minerva, MD;  Location: [facility] ORS;  Service: Orthopedics;  Laterality: Left;   Thumb surgery-left  02/25/2020   Patient Active Problem List   Diagnosis Date Noted   Lumbar radiculopathy 04/11/2024   Scoliosis deformity of spine 03/02/2024   Abscess 05/20/2021   Tobacco use 01/22/2021   Gastroesophageal reflux disease without esophagitis 08/17/2017   Chronic seasonal allergic rhinitis due to pollen 03/09/2017   Depression, recurrent 02/11/2017   Generalized OA 10/27/2016   Chronic midline low back pain without sciatica 10/27/2016   Hypertension, essential 10/27/2016   History of seizures 10/27/2016   REFERRING PROVIDER: Camie Pickle  REFERRING DIAG: Other forms of scoliosis, thoracolumbar region.    Rationale for Evaluation and Treatment: Rehabilitation  THERAPY DIAG:  Other low back pain  ONSET DATE: Ongoing, surgery (04/13/24).    SUBJECTIVE:  SUBJECTIVE STATEMENT: No new complaints.  PERTINENT HISTORY:  Please see above.    PAIN:  Are you having pain? Yes: NPRS scale: 5-6/0.   Pain location: LB.   Pain description: Ache, numbness over right LE.   Aggravating factors: Increased activity. Relieving factors: Rest.    PRECAUTIONS: Other: Please monitor gait.    RED FLAGS: None   WEIGHT BEARING RESTRICTIONS: No  FALLS:  Has patient fallen in last 6 months? No.  He reports no falls but as been close to falling.    LIVING ENVIRONMENT: Lives with: lives with their  spouse Lives in: House/apartment Has following equipment at home: None.  Recommended he use his cane at all times.    PLOF: Independent with basic ADLs  PATIENT GOALS: Reduce pain and numbness, get stronger.      OBJECTIVE:    PATIENT SURVEYS:  ODI:  32/50.   POSTURE: rounded shoulders and forward head  PALPATION: Patient c/o diffuse bilateral lumbar pain.     LOWER EXTREMITY ROM:     WNL.  LOWER EXTREMITY MMT:    Right hip flexion and knee extension is 3-/5, right hamstrings 4-/5, right hip abduction is 4/5, right ankle 4+/5.    FUNCTIONAL TESTS:  5 times sit to stand: 25 seconds Timed up and go (TUG): 15 seconds.    GAIT: The patient's gait is slow and purposeful with a wide BOS and increased right LE toe out.  TREATMENT DATE:   10/09/24:                                     EXERCISE LOG  Exercise Repetitions and Resistance Comments  Nustep Level 3 x 15 minutes    Rockerboard In parallel bars x 5 minutes    Seated hip add Ball squeezes x 5 minutes   Seated hip abd Yellow theraband x 5 minutes   Assisted right knee ext 4 minutes   Assisted right hip flexion (seated) 4 minutes   Seated right ham curls Yellow theraband x 2 minutes     10/03/24:  Nustep on level 2 x 15 minutes (LE's only) f/b Rockerboard with UE support x 4 minutes f/b seated hip adductor (ball squeezes x 2 minutes) f/b seated hip abduction with red theraband x 2 minutes.                                                                                                                              PATIENT EDUCATION:  Education details: Discussed using cane for safety. He has a cane and walking stick.    Person educated: Patient Education method: Explanation Education comprehension: verbalized understanding  HOME EXERCISE PROGRAM:   ASSESSMENT:  CLINICAL IMPRESSION: Patient motivated during his session today.  He did well with therex.  He requires assisted with antigravity right knee  extension an dhip flexion.    OBJECTIVE IMPAIRMENTS:  cardiopulmonary status limiting activity, decreased activity tolerance, decreased strength, increased muscle spasms, and pain.   ACTIVITY LIMITATIONS: carrying, lifting, bending, and locomotion level  PARTICIPATION LIMITATIONS: meal prep, cleaning, laundry, driving, shopping, community activity, and yard work  PERSONAL FACTORS: Time since onset of injury/illness/exacerbation are also affecting patient's functional outcome.   REHAB POTENTIAL: Good  CLINICAL DECISION MAKING: Stable/uncomplicated  EVALUATION COMPLEXITY: Low   GOALS:  SHORT TERM GOALS: Target date: 10/16/24  Ind with a HEP. Goal status: INITIAL   LONG TERM GOALS: Target date: 11/14/24  Ind with an advanced HEP.  Goal status: INITIAL  2.  Improve 5 times sit to stand test by 5 seconds.  Goal status: INITIAL  3.  Patient walk 500 feet in clinic with straight cane without LOB.  Goal status: INITIAL  4.  Improve right LE strength by 1 muscle grade to allow for greater ease of performing functional tasks.    Goal status: INITIAL  5.  Perform ADL's with pain not > 4/10.  Goal status: INITIAL  PLAN:  PT FREQUENCY: 2x/week  PT DURATION: 6 weeks  PLANNED INTERVENTIONS: 97110-Therapeutic exercises, 97530- Therapeutic activity, [id number]- Neuromuscular re-education, 97535- Self Care, [id number]- Manual therapy, [id number]- Electrical stimulation (unattended), 97035- Ultrasound, Patient/Family education, and Moist heat.  PLAN FOR NEXT SESSION: Right LE strengthening, gait activities, Core exercise progression.  Body mechanics training and spinal protection techniques.  Modalities and STW/M as needed.     Demarius Archila, PT 10/09/2024, 10:18 AM

## 2024-10-18 ENCOUNTER — Ambulatory Visit: Attending: Surgery | Admitting: *Deleted

## 2024-10-18 DIAGNOSIS — M6281 Muscle weakness (generalized): Secondary | ICD-10-CM | POA: Diagnosis present

## 2024-10-18 DIAGNOSIS — M5459 Other low back pain: Secondary | ICD-10-CM | POA: Diagnosis present

## 2024-10-18 DIAGNOSIS — M62830 Muscle spasm of back: Secondary | ICD-10-CM | POA: Diagnosis present

## 2024-10-18 NOTE — Therapy (Signed)
 OUTPATIENT PHYSICAL THERAPY THORACOLUMBAR TREATMENT   Patient Name: Bradley Fisher MRN: [id number] DOB:1966/03/26, 58 y.o., male Today's Date: 10/18/2024  END OF SESSION:  PT End of Session - 10/18/24 0919     Visit Number 3    Number of Visits 12    Date for Recertification  11/14/24    PT Start Time 0930    PT Stop Time 1020    PT Time Calculation (min) 50 min           Past Medical History:  Diagnosis Date   Arthritis    Asthma    COPD (chronic obstructive pulmonary disease) (HCC)    Depression    GERD (gastroesophageal reflux disease)    Hyperlipidemia    Hypertension    Seizures (HCC)    unknow etiology; on meds for this; no seizures since on meds 10 years ago   Past Surgical History:  Procedure Laterality Date   ABDOMINAL EXPOSURE N/A 04/11/2024   Procedure: ABDOMINAL EXPOSURE;  Surgeon: Gretta Lonni JINNY, MD;  Location: [facility] OR;  Service: Vascular;  Laterality: N/A;   ANTERIOR LAT LUMBAR FUSION Right 04/11/2024   Procedure: LUMBAR ONE-TWO, LUMBAR THREE-FOUR ANTERIOR LATERAL LUMBAR FUSION;  Surgeon: Debby Dorn MATSU, MD;  Location: [facility] OR;  Service: Neurosurgery;  Laterality: Right;  L12, L34 DLIF RT SIDE LATERAL DECUB   ANTERIOR LUMBAR FUSION Right 04/11/2024   Procedure: LUMBAR FOUR-FIVE, LUMBAR FIVE-SACRAL ONE ANTERIOR LUMBAR FUSION;  Surgeon: Debby Dorn MATSU, MD;  Location: [facility] OR;  Service: Neurosurgery;  Laterality: Right;  ALIF L45, L5S1   APPLICATION OF CELL SAVER N/A 04/13/2024   Procedure: APPLICATION OF CELL SAVER;  Surgeon: Debby Dorn MATSU, MD;  Location: [facility] OR;  Service: Neurosurgery;  Laterality: N/A;   APPLICATION OF ROBOTIC ASSISTANCE FOR SPINAL PROCEDURE N/A 04/13/2024   Procedure: APPLICATION OF ROBOTIC ASSISTANCE FOR SPINAL PROCEDURE;  Surgeon: Debby Dorn MATSU, MD;  Location: [facility] OR;  Service: Neurosurgery;  Laterality: N/A;   COLONOSCOPY WITH PROPOFOL  N/A 05/29/2018   Procedure: COLONOSCOPY WITH PROPOFOL ;  Surgeon: Shaaron Lamar HERO, MD;   Location: [facility];  Service: Endoscopy;  Laterality: N/A;  9:45am   KNEE ARTHROSCOPY WITH MEDIAL MENISECTOMY Left 11/11/2016   Procedure: LEFT KNEE ARTHROSCOPY WITH MEDIAL MENISECTOMY;  Surgeon: Taft FORBES Minerva, MD;  Location: [facility] ORS;  Service: Orthopedics;  Laterality: Left;   Thumb surgery-left  02/25/2020   Patient Active Problem List   Diagnosis Date Noted   Lumbar radiculopathy 04/11/2024   Scoliosis deformity of spine 03/02/2024   Abscess 05/20/2021   Tobacco use 01/22/2021   Gastroesophageal reflux disease without esophagitis 08/17/2017   Chronic seasonal allergic rhinitis due to pollen 03/09/2017   Depression, recurrent 02/11/2017   Generalized OA 10/27/2016   Chronic midline low back pain without sciatica 10/27/2016   Hypertension, essential 10/27/2016   History of seizures 10/27/2016   REFERRING PROVIDER: Camie Pickle  REFERRING DIAG: Other forms of scoliosis, thoracolumbar region.    Rationale for Evaluation and Treatment: Rehabilitation  THERAPY DIAG:  Other low back pain  Muscle weakness (generalized)  Muscle spasm of back  ONSET DATE: Ongoing, surgery (04/13/24).    SUBJECTIVE:  SUBJECTIVE STATEMENT: RT LE gives away on me. Did okay after last Rx  PERTINENT HISTORY:  Please see above.    PAIN:  Are you having pain? Yes: NPRS scale: 5-6/0.   Pain location: LB.   Pain description: Ache, numbness over right LE.   Aggravating factors: Increased activity. Relieving factors: Rest.    PRECAUTIONS: Other: Please monitor gait.    RED FLAGS: None   WEIGHT BEARING RESTRICTIONS: No  FALLS:  Has patient fallen in last 6 months? No.  He reports no falls but as been close to falling.    LIVING ENVIRONMENT: Lives with: lives with their spouse Lives in:  House/apartment Has following equipment at home: None.  Recommended he use his cane at all times.    PLOF: Independent with basic ADLs  PATIENT GOALS: Reduce pain and numbness, get stronger.      OBJECTIVE:    PATIENT SURVEYS:  ODI:  32/50.   POSTURE: rounded shoulders and forward head  PALPATION: Patient c/o diffuse bilateral lumbar pain.     LOWER EXTREMITY ROM:     WNL.  LOWER EXTREMITY MMT:    Right hip flexion and knee extension is 3-/5, right hamstrings 4-/5, right hip abduction is 4/5, right ankle 4+/5.    FUNCTIONAL TESTS:  5 times sit to stand: 25 seconds Timed up and go (TUG): 15 seconds.    GAIT: The patient's gait is slow and purposeful with a wide BOS and increased right LE toe out.  TREATMENT DATE:   10/18/24:                                     EXERCISE LOG    LB and RT LE weakness  Exercise Repetitions and Resistance Comments  Nustep Level 3 x 15 minutes    Rockerboard In parallel bars x 5 minutes    Seated hip add Ball squeezes x 5 minutes   Seated hip abd Yellow theraband x 5 minutes   Assisted right knee ext   3x10 for TKE   LAQ 3# 3x10 pause at top Within available ROM  Assisted right hip flexion (seated) 4 minutes   Seated right ham curls green theraband 2x20     10/03/24:  Nustep on level 2 x 15 minutes (LE's only) f/b Rockerboard with UE support x 4 minutes f/b seated hip adductor (ball squeezes x 2 minutes) f/b seated hip abduction with red theraband x 2 minutes.                                                                                                                              PATIENT EDUCATION:  Education details: Discussed using cane for safety. He has a cane and walking stick.    Person educated: Patient Education method: Explanation Education comprehension: verbalized understanding  HOME EXERCISE PROGRAM:   ASSESSMENT:  CLINICAL IMPRESSION: Patient reports RT  LE continues to give away at times. Rx focused on  core stabilization and LE strengthening as well as balance.  He did well with session today without increased pain.  He does have TKE lag and needs assistance with TKE's    OBJECTIVE IMPAIRMENTS: cardiopulmonary status limiting activity, decreased activity tolerance, decreased strength, increased muscle spasms, and pain.   ACTIVITY LIMITATIONS: carrying, lifting, bending, and locomotion level  PARTICIPATION LIMITATIONS: meal prep, cleaning, laundry, driving, shopping, community activity, and yard work  PERSONAL FACTORS: Time since onset of injury/illness/exacerbation are also affecting patient's functional outcome.   REHAB POTENTIAL: Good  CLINICAL DECISION MAKING: Stable/uncomplicated  EVALUATION COMPLEXITY: Low   GOALS:  SHORT TERM GOALS: Target date: 10/16/24  Ind with a HEP. Goal status: INITIAL   LONG TERM GOALS: Target date: 11/14/24  Ind with an advanced HEP.  Goal status: INITIAL  2.  Improve 5 times sit to stand test by 5 seconds.  Goal status: INITIAL  3.  Patient walk 500 feet in clinic with straight cane without LOB.  Goal status: INITIAL  4.  Improve right LE strength by 1 muscle grade to allow for greater ease of performing functional tasks.    Goal status: INITIAL  5.  Perform ADL's with pain not > 4/10.  Goal status: INITIAL  PLAN:  PT FREQUENCY: 2x/week  PT DURATION: 6 weeks  PLANNED INTERVENTIONS: 97110-Therapeutic exercises, 97530- Therapeutic activity, [id number]- Neuromuscular re-education, 97535- Self Care, 02859- Manual therapy, [id number]- Electrical stimulation (unattended), 97035- Ultrasound, Patient/Family education, and Moist heat.  PLAN FOR NEXT SESSION: Right LE strengthening, gait activities, Core exercise progression.  Body mechanics training and spinal protection techniques.  Modalities and STW/M as needed.     Kerilyn Cortner,CHRIS, PTA 10/18/2024, 1:20 PM

## 2024-10-25 ENCOUNTER — Ambulatory Visit: Admitting: Physical Therapy

## 2024-10-25 DIAGNOSIS — M5459 Other low back pain: Secondary | ICD-10-CM

## 2024-10-25 DIAGNOSIS — M6281 Muscle weakness (generalized): Secondary | ICD-10-CM

## 2024-10-25 NOTE — Therapy (Signed)
 OUTPATIENT PHYSICAL THERAPY THORACOLUMBAR TREATMENT   Patient Name: Bradley Fisher MRN: [id number] DOB:07/13/1966, 58 y.o., male Today's Date: 10/25/2024  END OF SESSION:  PT End of Session - 10/25/24 0923     Visit Number 4    Number of Visits 12    Date for Recertification  11/14/24    PT Start Time 0905    PT Stop Time 1010    PT Time Calculation (min) 65 min    Activity Tolerance Patient tolerated treatment well    Behavior During Therapy [facility] for tasks assessed/performed           Past Medical History:  Diagnosis Date   Arthritis    Asthma    COPD (chronic obstructive pulmonary disease) (HCC)    Depression    GERD (gastroesophageal reflux disease)    Hyperlipidemia    Hypertension    Seizures (HCC)    unknow etiology; on meds for this; no seizures since on meds 10 years ago   Past Surgical History:  Procedure Laterality Date   ABDOMINAL EXPOSURE N/A 04/11/2024   Procedure: ABDOMINAL EXPOSURE;  Surgeon: Gretta Lonni JINNY, MD;  Location: [facility] OR;  Service: Vascular;  Laterality: N/A;   ANTERIOR LAT LUMBAR FUSION Right 04/11/2024   Procedure: LUMBAR ONE-TWO, LUMBAR THREE-FOUR ANTERIOR LATERAL LUMBAR FUSION;  Surgeon: Debby Dorn MATSU, MD;  Location: [facility] OR;  Service: Neurosurgery;  Laterality: Right;  L12, L34 DLIF RT SIDE LATERAL DECUB   ANTERIOR LUMBAR FUSION Right 04/11/2024   Procedure: LUMBAR FOUR-FIVE, LUMBAR FIVE-SACRAL ONE ANTERIOR LUMBAR FUSION;  Surgeon: Debby Dorn MATSU, MD;  Location: [facility] OR;  Service: Neurosurgery;  Laterality: Right;  ALIF L45, L5S1   APPLICATION OF CELL SAVER N/A 04/13/2024   Procedure: APPLICATION OF CELL SAVER;  Surgeon: Debby Dorn MATSU, MD;  Location: [facility] OR;  Service: Neurosurgery;  Laterality: N/A;   APPLICATION OF ROBOTIC ASSISTANCE FOR SPINAL PROCEDURE N/A 04/13/2024   Procedure: APPLICATION OF ROBOTIC ASSISTANCE FOR SPINAL PROCEDURE;  Surgeon: Debby Dorn MATSU, MD;  Location: [facility] OR;  Service: Neurosurgery;  Laterality: N/A;    COLONOSCOPY WITH PROPOFOL  N/A 05/29/2018   Procedure: COLONOSCOPY WITH PROPOFOL ;  Surgeon: Shaaron Lamar HERO, MD;  Location: [facility];  Service: Endoscopy;  Laterality: N/A;  9:45am   KNEE ARTHROSCOPY WITH MEDIAL MENISECTOMY Left 11/11/2016   Procedure: LEFT KNEE ARTHROSCOPY WITH MEDIAL MENISECTOMY;  Surgeon: Taft FORBES Minerva, MD;  Location: [facility] ORS;  Service: Orthopedics;  Laterality: Left;   Thumb surgery-left  02/25/2020   Patient Active Problem List   Diagnosis Date Noted   Lumbar radiculopathy 04/11/2024   Scoliosis deformity of spine 03/02/2024   Abscess 05/20/2021   Tobacco use 01/22/2021   Gastroesophageal reflux disease without esophagitis 08/17/2017   Chronic seasonal allergic rhinitis due to pollen 03/09/2017   Depression, recurrent 02/11/2017   Generalized OA 10/27/2016   Chronic midline low back pain without sciatica 10/27/2016   Hypertension, essential 10/27/2016   History of seizures 10/27/2016   REFERRING PROVIDER: Camie Pickle  REFERRING DIAG: Other forms of scoliosis, thoracolumbar region.    Rationale for Evaluation and Treatment: Rehabilitation  THERAPY DIAG:  Other low back pain  Muscle weakness (generalized)  ONSET DATE: Ongoing, surgery (04/13/24).    SUBJECTIVE:  SUBJECTIVE STATEMENT: About the same.  PERTINENT HISTORY:  Please see above.    PAIN:  Are you having pain? Yes: NPRS scale: 5-6/0.   Pain location: LB.   Pain description: Ache, numbness over right LE.   Aggravating factors: Increased activity. Relieving factors: Rest.    PRECAUTIONS: Other: Please monitor gait.    RED FLAGS: None   WEIGHT BEARING RESTRICTIONS: No  FALLS:  Has patient fallen in last 6 months? No.  He reports no falls but as been close to falling.    LIVING  ENVIRONMENT: Lives with: lives with their spouse Lives in: House/apartment Has following equipment at home: None.  Recommended he use his cane at all times.    PLOF: Independent with basic ADLs  PATIENT GOALS: Reduce pain and numbness, get stronger.      OBJECTIVE:    PATIENT SURVEYS:  ODI:  32/50.   POSTURE: rounded shoulders and forward head  PALPATION: Patient c/o diffuse bilateral lumbar pain.     LOWER EXTREMITY ROM:     WNL.  LOWER EXTREMITY MMT:    Right hip flexion and knee extension is 3-/5, right hamstrings 4-/5, right hip abduction is 4/5, right ankle 4+/5.    FUNCTIONAL TESTS:  5 times sit to stand: 25 seconds Timed up and go (TUG): 15 seconds.    GAIT: The patient's gait is slow and purposeful with a wide BOS and increased right LE toe out.  TREATMENT DATE:   10/25/24:                                     EXERCISE LOG  Exercise Repetitions and Resistance Comments  Nustep Level 3 x 15 minutes (LE's only).   Rockerboard  In parallel bars x 5 minutes   Seated hip add Ball squeezes x 4 minutes    Seated hip abd Yellow theraband x 4 minutes       STW/M x 10 minutes f/b HMP and IFC at 80-150 Hz on 40% scan x 15 minutes.  Normal modality resposne following removal of modality.  10/18/24:                                     EXERCISE LOG    LB and RT LE weakness  Exercise Repetitions and Resistance Comments  Nustep Level 3 x 15 minutes    Rockerboard In parallel bars x 5 minutes    Seated hip add Ball squeezes x 5 minutes   Seated hip abd Yellow theraband x 5 minutes   Assisted right knee ext   3x10 for TKE   LAQ 3# 3x10 pause at top Within available ROM  Assisted right hip flexion (seated) 4 minutes   Seated right ham curls green theraband 2x20     10/03/24:  Nustep on level 2 x 15 minutes (LE's only) f/b Rockerboard with UE support x 4 minutes f/b seated hip adductor (ball squeezes x 2 minutes) f/b seated hip abduction with red theraband x 2  minutes.  PATIENT EDUCATION:  Education details: Discussed using cane for safety. He has a cane and walking stick.    Person educated: Patient Education method: Explanation Education comprehension: verbalized understanding  HOME EXERCISE PROGRAM:   ASSESSMENT:  CLINICAL IMPRESSION: Patient had notable tenderness over his left lower thoracic/upper lumbar region.  He did well with soft tissue work.    OBJECTIVE IMPAIRMENTS: cardiopulmonary status limiting activity, decreased activity tolerance, decreased strength, increased muscle spasms, and pain.   ACTIVITY LIMITATIONS: carrying, lifting, bending, and locomotion level  PARTICIPATION LIMITATIONS: meal prep, cleaning, laundry, driving, shopping, community activity, and yard work  PERSONAL FACTORS: Time since onset of injury/illness/exacerbation are also affecting patient's functional outcome.   REHAB POTENTIAL: Good  CLINICAL DECISION MAKING: Stable/uncomplicated  EVALUATION COMPLEXITY: Low   GOALS:  SHORT TERM GOALS: Target date: 10/16/24  Ind with a HEP. Goal status: INITIAL   LONG TERM GOALS: Target date: 11/14/24  Ind with an advanced HEP.  Goal status: INITIAL  2.  Improve 5 times sit to stand test by 5 seconds.  Goal status: INITIAL  3.  Patient walk 500 feet in clinic with straight cane without LOB.  Goal status: INITIAL  4.  Improve right LE strength by 1 muscle grade to allow for greater ease of performing functional tasks.    Goal status: INITIAL  5.  Perform ADL's with pain not > 4/10.  Goal status: INITIAL  PLAN:  PT FREQUENCY: 2x/week  PT DURATION: 6 weeks  PLANNED INTERVENTIONS: 97110-Therapeutic exercises, 97530- Therapeutic activity, [id number]- Neuromuscular re-education, 97535- Self Care, 02859- Manual therapy, G0283- Electrical stimulation (unattended), 97035- Ultrasound,  Patient/Family education, and Moist heat.  PLAN FOR NEXT SESSION: Right LE strengthening, gait activities, Core exercise progression.  Body mechanics training and spinal protection techniques.  Modalities and STW/M as needed.     Gillermo Poch, PT 10/25/2024, 10:13 AM

## 2024-10-30 ENCOUNTER — Ambulatory Visit

## 2024-10-31 ENCOUNTER — Ambulatory Visit

## 2024-10-31 DIAGNOSIS — M6281 Muscle weakness (generalized): Secondary | ICD-10-CM

## 2024-10-31 DIAGNOSIS — M62830 Muscle spasm of back: Secondary | ICD-10-CM

## 2024-10-31 DIAGNOSIS — M5459 Other low back pain: Secondary | ICD-10-CM

## 2024-10-31 NOTE — Therapy (Signed)
 OUTPATIENT PHYSICAL THERAPY THORACOLUMBAR TREATMENT   Patient Name: Bradley Fisher MRN: [id number] DOB:1966-02-11, 58 y.o., male Today's Date: 10/31/2024  END OF SESSION:  PT End of Session - 10/31/24 1017     Visit Number 5    Number of Visits 12    Date for Recertification  11/14/24    PT Start Time 1015    PT Stop Time 1105    PT Time Calculation (min) 50 min    Activity Tolerance Patient tolerated treatment well    Behavior During Therapy WFL for tasks assessed/performed           Past Medical History:  Diagnosis Date   Arthritis    Asthma    COPD (chronic obstructive pulmonary disease) (HCC)    Depression    GERD (gastroesophageal reflux disease)    Hyperlipidemia    Hypertension    Seizures (HCC)    unknow etiology; on meds for this; no seizures since on meds 10 years ago   Past Surgical History:  Procedure Laterality Date   ABDOMINAL EXPOSURE N/A 04/11/2024   Procedure: ABDOMINAL EXPOSURE;  Surgeon: Gretta Lonni JINNY, MD;  Location: [facility] OR;  Service: Vascular;  Laterality: N/A;   ANTERIOR LAT LUMBAR FUSION Right 04/11/2024   Procedure: LUMBAR ONE-TWO, LUMBAR THREE-FOUR ANTERIOR LATERAL LUMBAR FUSION;  Surgeon: Debby Dorn MATSU, MD;  Location: [facility] OR;  Service: Neurosurgery;  Laterality: Right;  L12, L34 DLIF RT SIDE LATERAL DECUB   ANTERIOR LUMBAR FUSION Right 04/11/2024   Procedure: LUMBAR FOUR-FIVE, LUMBAR FIVE-SACRAL ONE ANTERIOR LUMBAR FUSION;  Surgeon: Debby Dorn MATSU, MD;  Location: [facility] OR;  Service: Neurosurgery;  Laterality: Right;  ALIF L45, L5S1   APPLICATION OF CELL SAVER N/A 04/13/2024   Procedure: APPLICATION OF CELL SAVER;  Surgeon: Debby Dorn MATSU, MD;  Location: [facility] OR;  Service: Neurosurgery;  Laterality: N/A;   APPLICATION OF ROBOTIC ASSISTANCE FOR SPINAL PROCEDURE N/A 04/13/2024   Procedure: APPLICATION OF ROBOTIC ASSISTANCE FOR SPINAL PROCEDURE;  Surgeon: Debby Dorn MATSU, MD;  Location: [facility] OR;  Service: Neurosurgery;  Laterality: N/A;    COLONOSCOPY WITH PROPOFOL  N/A 05/29/2018   Procedure: COLONOSCOPY WITH PROPOFOL ;  Surgeon: Shaaron Lamar HERO, MD;  Location: [facility];  Service: Endoscopy;  Laterality: N/A;  9:45am   KNEE ARTHROSCOPY WITH MEDIAL MENISECTOMY Left 11/11/2016   Procedure: LEFT KNEE ARTHROSCOPY WITH MEDIAL MENISECTOMY;  Surgeon: Taft FORBES Minerva, MD;  Location: [facility] ORS;  Service: Orthopedics;  Laterality: Left;   Thumb surgery-left  02/25/2020   Patient Active Problem List   Diagnosis Date Noted   Lumbar radiculopathy 04/11/2024   Scoliosis deformity of spine 03/02/2024   Abscess 05/20/2021   Tobacco use 01/22/2021   Gastroesophageal reflux disease without esophagitis 08/17/2017   Chronic seasonal allergic rhinitis due to pollen 03/09/2017   Depression, recurrent 02/11/2017   Generalized OA 10/27/2016   Chronic midline low back pain without sciatica 10/27/2016   Hypertension, essential 10/27/2016   History of seizures 10/27/2016   REFERRING PROVIDER: Camie Pickle  REFERRING DIAG: Other forms of scoliosis, thoracolumbar region.    Rationale for Evaluation and Treatment: Rehabilitation  THERAPY DIAG:  Other low back pain  Muscle weakness (generalized)  Muscle spasm of back  ONSET DATE: Ongoing, surgery (04/13/24).    SUBJECTIVE:  SUBJECTIVE STATEMENT: About the same.  PERTINENT HISTORY:  Please see above.    PAIN:  Are you having pain? Yes: NPRS scale: 7/10 Pain location: RLE and low back  Pain description: Ache, numbness over right LE.   Aggravating factors: Increased activity. Relieving factors: Rest.    PRECAUTIONS: Other: Please monitor gait.    RED FLAGS: None   WEIGHT BEARING RESTRICTIONS: No  FALLS:  Has patient fallen in last 6 months? No.  He reports no falls but as been close to  falling.    LIVING ENVIRONMENT: Lives with: lives with their spouse Lives in: House/apartment Has following equipment at home: None.  Recommended he use his cane at all times.    PLOF: Independent with basic ADLs  PATIENT GOALS: Reduce pain and numbness, get stronger.      OBJECTIVE:    PATIENT SURVEYS:  ODI:  32/50.   POSTURE: rounded shoulders and forward head  PALPATION: Patient c/o diffuse bilateral lumbar pain.     LOWER EXTREMITY ROM:     WNL.  LOWER EXTREMITY MMT:    Right hip flexion and knee extension is 3-/5, right hamstrings 4-/5, right hip abduction is 4/5, right ankle 4+/5.    FUNCTIONAL TESTS:  5 times sit to stand: 25 seconds Timed up and go (TUG): 15 seconds.    GAIT: The patient's gait is slow and purposeful with a wide BOS and increased right LE toe out.  TREATMENT DATE:   10/31/24:                                   EXERCISE LOG  Exercise Repetitions and Resistance Comments  Nustep Level 3 x 15 minutes (LE's only).   Rockerboard  In parallel bars x 5 minutes   Seated hip add Ball squeezes x 5 minutes    Seated hip abd Yellow theraband x 5 minutes   Goal Assessment See Below   HMP and IFC at 80-150 Hz on 40% scan x 15 minutes.  Normal modality resposne following removal of modality.  10/18/24:                                     EXERCISE LOG    LB and RT LE weakness  Exercise Repetitions and Resistance Comments  Nustep Level 3 x 15 minutes    Rockerboard In parallel bars x 5 minutes    Seated hip add Ball squeezes x 5 minutes   Seated hip abd Yellow theraband x 5 minutes   Assisted right knee ext   3x10 for TKE   LAQ 3# 3x10 pause at top Within available ROM  Assisted right hip flexion (seated) 4 minutes   Seated right ham curls green theraband 2x20     10/03/24:  Nustep on level 2 x 15 minutes (LE's only) f/b Rockerboard with UE support x 4 minutes f/b seated hip adductor (ball squeezes x 2 minutes) f/b seated hip abduction with  red theraband x 2 minutes.  PATIENT EDUCATION:  Education details: Discussed using cane for safety. He has a cane and walking stick.    Person educated: Patient Education method: Explanation Education comprehension: verbalized understanding  HOME EXERCISE PROGRAM:   ASSESSMENT:  CLINICAL IMPRESSION: Pt arrives for today's treatment session reporting 7/10 low back and RLE pain.  Pt able to tolerate increased time with seated exercises today.  Normal responses to estim and MH noted upon removal.  Pt reported 6/10 low back and RLE pain at completion of today's treatment session.   OBJECTIVE IMPAIRMENTS: cardiopulmonary status limiting activity, decreased activity tolerance, decreased strength, increased muscle spasms, and pain.   ACTIVITY LIMITATIONS: carrying, lifting, bending, and locomotion level  PARTICIPATION LIMITATIONS: meal prep, cleaning, laundry, driving, shopping, community activity, and yard work  PERSONAL FACTORS: Time since onset of injury/illness/exacerbation are also affecting patient's functional outcome.   REHAB POTENTIAL: Good  CLINICAL DECISION MAKING: Stable/uncomplicated  EVALUATION COMPLEXITY: Low   GOALS:  SHORT TERM GOALS: Target date: 10/16/24  Ind with a HEP. Goal status: MET   LONG TERM GOALS: Target date: 11/14/24  Ind with an advanced HEP.  Goal status: IN PROGRESS  2.  Improve 5 times sit to stand test by 5 seconds.  Goal status: INITIAL  3.  Patient walk 500 feet in clinic with straight cane without LOB.   11/19:  Goal status: INITIAL  4.  Improve right LE strength by 1 muscle grade to allow for greater ease of performing functional tasks.    Goal status: IN PROGRESS  5.  Perform ADL's with pain not > 4/10.  Goal status: IN PROGRESS  PLAN:  PT FREQUENCY: 2x/week  PT DURATION: 6 weeks  PLANNED  INTERVENTIONS: 97110-Therapeutic exercises, 97530- Therapeutic activity, [id number]- Neuromuscular re-education, 97535- Self Care, 02859- Manual therapy, G0283- Electrical stimulation (unattended), 97035- Ultrasound, Patient/Family education, and Moist heat.  PLAN FOR NEXT SESSION: Right LE strengthening, gait activities, Core exercise progression.  Body mechanics training and spinal protection techniques.  Modalities and STW/M as needed.     Delon DELENA Gosling, PTA 10/31/2024, 11:11 AM

## 2024-11-06 ENCOUNTER — Ambulatory Visit: Admitting: *Deleted

## 2024-11-06 ENCOUNTER — Encounter: Payer: Self-pay | Admitting: *Deleted

## 2024-11-06 DIAGNOSIS — M62830 Muscle spasm of back: Secondary | ICD-10-CM

## 2024-11-06 DIAGNOSIS — M5459 Other low back pain: Secondary | ICD-10-CM | POA: Diagnosis not present

## 2024-11-06 DIAGNOSIS — M6281 Muscle weakness (generalized): Secondary | ICD-10-CM

## 2024-11-06 NOTE — Therapy (Signed)
 OUTPATIENT PHYSICAL THERAPY THORACOLUMBAR TREATMENT   Patient Name: Bradley Fisher MRN: [id number] DOB:August 22, 1966, 59 y.o., male Today's Date: 11/06/2024  END OF SESSION:  PT End of Session - 11/06/24 1030     Visit Number 6    Number of Visits 12    Date for Recertification  11/14/24    PT Start Time 1015    PT Stop Time 1100    PT Time Calculation (min) 45 min           Past Medical History:  Diagnosis Date   Arthritis    Asthma    COPD (chronic obstructive pulmonary disease) (HCC)    Depression    GERD (gastroesophageal reflux disease)    Hyperlipidemia    Hypertension    Seizures (HCC)    unknow etiology; on meds for this; no seizures since on meds 10 years ago   Past Surgical History:  Procedure Laterality Date   ABDOMINAL EXPOSURE N/A 04/11/2024   Procedure: ABDOMINAL EXPOSURE;  Surgeon: Gretta Lonni JINNY, MD;  Location: [facility] OR;  Service: Vascular;  Laterality: N/A;   ANTERIOR LAT LUMBAR FUSION Right 04/11/2024   Procedure: LUMBAR ONE-TWO, LUMBAR THREE-FOUR ANTERIOR LATERAL LUMBAR FUSION;  Surgeon: Debby Dorn MATSU, MD;  Location: [facility] OR;  Service: Neurosurgery;  Laterality: Right;  L12, L34 DLIF RT SIDE LATERAL DECUB   ANTERIOR LUMBAR FUSION Right 04/11/2024   Procedure: LUMBAR FOUR-FIVE, LUMBAR FIVE-SACRAL ONE ANTERIOR LUMBAR FUSION;  Surgeon: Debby Dorn MATSU, MD;  Location: [facility] OR;  Service: Neurosurgery;  Laterality: Right;  ALIF L45, L5S1   APPLICATION OF CELL SAVER N/A 04/13/2024   Procedure: APPLICATION OF CELL SAVER;  Surgeon: Debby Dorn MATSU, MD;  Location: [facility] OR;  Service: Neurosurgery;  Laterality: N/A;   APPLICATION OF ROBOTIC ASSISTANCE FOR SPINAL PROCEDURE N/A 04/13/2024   Procedure: APPLICATION OF ROBOTIC ASSISTANCE FOR SPINAL PROCEDURE;  Surgeon: Debby Dorn MATSU, MD;  Location: [facility] OR;  Service: Neurosurgery;  Laterality: N/A;   COLONOSCOPY WITH PROPOFOL  N/A 05/29/2018   Procedure: COLONOSCOPY WITH PROPOFOL ;  Surgeon: Shaaron Lamar HERO, MD;   Location: [facility];  Service: Endoscopy;  Laterality: N/A;  9:45am   KNEE ARTHROSCOPY WITH MEDIAL MENISECTOMY Left 11/11/2016   Procedure: LEFT KNEE ARTHROSCOPY WITH MEDIAL MENISECTOMY;  Surgeon: Taft FORBES Minerva, MD;  Location: [facility] ORS;  Service: Orthopedics;  Laterality: Left;   Thumb surgery-left  02/25/2020   Patient Active Problem List   Diagnosis Date Noted   Lumbar radiculopathy 04/11/2024   Scoliosis deformity of spine 03/02/2024   Abscess 05/20/2021   Tobacco use 01/22/2021   Gastroesophageal reflux disease without esophagitis 08/17/2017   Chronic seasonal allergic rhinitis due to pollen 03/09/2017   Depression, recurrent 02/11/2017   Generalized OA 10/27/2016   Chronic midline low back pain without sciatica 10/27/2016   Hypertension, essential 10/27/2016   History of seizures 10/27/2016   REFERRING PROVIDER: Camie Pickle  REFERRING DIAG: Other forms of scoliosis, thoracolumbar region.    Rationale for Evaluation and Treatment: Rehabilitation  THERAPY DIAG:  Other low back pain  Muscle weakness (generalized)  Muscle spasm of back  ONSET DATE: Ongoing, surgery (04/13/24).    SUBJECTIVE:  SUBJECTIVE STATEMENT: Forgot to take pain meds this morning. 8/10, Need to get home for my pills. MD F/U 11-12-24. Need to change my water heater today.  PERTINENT HISTORY:  Please see above.    PAIN:  Are you having pain? Yes: NPRS scale: 8/10 Pain location: RLE and low back  Pain description: Ache, numbness over right LE.   Aggravating factors: Increased activity. Relieving factors: Rest.    PRECAUTIONS: Other: Please monitor gait.    RED FLAGS: None   WEIGHT BEARING RESTRICTIONS: No  FALLS:  Has patient fallen in last 6 months? No.  He reports no falls but as been close to  falling.    LIVING ENVIRONMENT: Lives with: lives with their spouse Lives in: House/apartment Has following equipment at home: None.  Recommended he use his cane at all times.    PLOF: Independent with basic ADLs  PATIENT GOALS: Reduce pain and numbness, get stronger.      OBJECTIVE:    PATIENT SURVEYS:  ODI:  32/50.   POSTURE: rounded shoulders and forward head  PALPATION: Patient c/o diffuse bilateral lumbar pain.     LOWER EXTREMITY ROM:     WNL.  LOWER EXTREMITY MMT:    Right hip flexion and knee extension is 3-/5, right hamstrings 4-/5, right hip abduction is 4/5, right ankle 4+/5.    FUNCTIONAL TESTS:  5 times sit to stand: 25 seconds Timed up and go (TUG): 15 seconds.    GAIT: The patient's gait is slow and purposeful with a wide BOS and increased right LE toe out.  TREATMENT DATE:   11/05/24:                                   EXERCISE LOG    LB  Exercise Repetitions and Resistance Comments  Nustep Level 3 x 15 minutes (LE's only).   Rockerboard  In parallel bars x 5 minutes   Seated hip add Ball squeezes x 3 minutes    Seated hip abd Red theraband x 5 minutes   Seated rows RED    3x12-15   Standing Marching  2x 10 Bil   HMP and IFC   Normal modality resposne following removal of modality. Discussed ADL's and movement patterns to decrease pain triggers Reviewed and discussed HEP  10/18/24:                                     EXERCISE LOG    LB and RT LE weakness  Exercise Repetitions and Resistance Comments  Nustep Level 3 x 15 minutes    Rockerboard In parallel bars x 5 minutes    Seated hip add Ball squeezes x 5 minutes   Seated hip abd Yellow theraband x 5 minutes   Assisted right knee ext   3x10 for TKE   LAQ 3# 3x10 pause at top Within available ROM  Assisted right hip flexion (seated) 4 minutes   Seated right ham curls green theraband 2x20     10/03/24:  Nustep on level 2 x 15 minutes (LE's only) f/b Rockerboard with UE support x 4  minutes f/b seated hip adductor (ball squeezes x 2 minutes) f/b seated hip abduction with red theraband x 2 minutes.  PATIENT EDUCATION:  Education details: Discussed using cane for safety. He has a cane and walking stick.    Person educated: Patient Education method: Explanation Education comprehension: verbalized understanding  HOME EXERCISE PROGRAM:   ASSESSMENT:  CLINICAL IMPRESSION: Pt arrives for today's treatment session reporting 8/10 low back and RLE pain.  Pt able to tolerate increased act's today with seated / standing exercises today.  No estim and MH today due to Pt needing to leave.   OBJECTIVE IMPAIRMENTS: cardiopulmonary status limiting activity, decreased activity tolerance, decreased strength, increased muscle spasms, and pain.   ACTIVITY LIMITATIONS: carrying, lifting, bending, and locomotion level  PARTICIPATION LIMITATIONS: meal prep, cleaning, laundry, driving, shopping, community activity, and yard work  PERSONAL FACTORS: Time since onset of injury/illness/exacerbation are also affecting patient's functional outcome.   REHAB POTENTIAL: Good  CLINICAL DECISION MAKING: Stable/uncomplicated  EVALUATION COMPLEXITY: Low   GOALS:  SHORT TERM GOALS: Target date: 10/16/24  Ind with a HEP. Goal status: MET   LONG TERM GOALS: Target date: 11/14/24  Ind with an advanced HEP.  Goal status: IN PROGRESS  2.  Improve 5 times sit to stand test by 5 seconds.  Goal status: INITIAL  3.  Patient walk 500 feet in clinic with straight cane without LOB.   11/19:  Goal status: INITIAL  4.  Improve right LE strength by 1 muscle grade to allow for greater ease of performing functional tasks.    Goal status: IN PROGRESS  5.  Perform ADL's with pain not > 4/10.  Goal status: IN PROGRESS  PLAN:  PT FREQUENCY: 2x/week  PT DURATION: 6  weeks  PLANNED INTERVENTIONS: 97110-Therapeutic exercises, 97530- Therapeutic activity, [id number]- Neuromuscular re-education, 97535- Self Care, 02859- Manual therapy, [id number]- Electrical stimulation (unattended), 97035- Ultrasound, Patient/Family education, and Moist heat.  PLAN FOR NEXT SESSION: Right LE strengthening, gait activities, Core exercise progression.  Body mechanics training and spinal protection techniques.  Modalities and STW/M as needed.     Elison Worrel,CHRIS, PTA 11/06/2024, 11:17 AM

## 2024-11-13 ENCOUNTER — Ambulatory Visit: Attending: Surgery

## 2024-11-13 DIAGNOSIS — M5459 Other low back pain: Secondary | ICD-10-CM | POA: Diagnosis present

## 2024-11-13 DIAGNOSIS — M62830 Muscle spasm of back: Secondary | ICD-10-CM | POA: Insufficient documentation

## 2024-11-13 DIAGNOSIS — G8929 Other chronic pain: Secondary | ICD-10-CM | POA: Diagnosis present

## 2024-11-13 DIAGNOSIS — M6281 Muscle weakness (generalized): Secondary | ICD-10-CM | POA: Diagnosis present

## 2024-11-13 DIAGNOSIS — R293 Abnormal posture: Secondary | ICD-10-CM | POA: Insufficient documentation

## 2024-11-13 DIAGNOSIS — M1990 Unspecified osteoarthritis, unspecified site: Secondary | ICD-10-CM | POA: Diagnosis present

## 2024-11-13 DIAGNOSIS — R0600 Dyspnea, unspecified: Secondary | ICD-10-CM | POA: Diagnosis present

## 2024-11-13 DIAGNOSIS — M545 Low back pain, unspecified: Secondary | ICD-10-CM | POA: Insufficient documentation

## 2024-11-13 NOTE — Therapy (Signed)
 OUTPATIENT PHYSICAL THERAPY THORACOLUMBAR TREATMENT   Patient Name: Bradley Fisher MRN: [id number] DOB:July 16, 1966, 58 y.o., male Today's Date: 11/13/2024  END OF SESSION:  PT End of Session - 11/13/24 0939     Visit Number 7    Number of Visits 12    Date for Recertification  11/14/24    PT Start Time 0930    PT Stop Time 1010    PT Time Calculation (min) 40 min           Past Medical History:  Diagnosis Date   Arthritis    Asthma    COPD (chronic obstructive pulmonary disease) (HCC)    Depression    GERD (gastroesophageal reflux disease)    Hyperlipidemia    Hypertension    Seizures (HCC)    unknow etiology; on meds for this; no seizures since on meds 10 years ago   Past Surgical History:  Procedure Laterality Date   ABDOMINAL EXPOSURE N/A 04/11/2024   Procedure: ABDOMINAL EXPOSURE;  Surgeon: Gretta Lonni JINNY, MD;  Location: [facility] OR;  Service: Vascular;  Laterality: N/A;   ANTERIOR LAT LUMBAR FUSION Right 04/11/2024   Procedure: LUMBAR ONE-TWO, LUMBAR THREE-FOUR ANTERIOR LATERAL LUMBAR FUSION;  Surgeon: Debby Dorn MATSU, MD;  Location: [facility] OR;  Service: Neurosurgery;  Laterality: Right;  L12, L34 DLIF RT SIDE LATERAL DECUB   ANTERIOR LUMBAR FUSION Right 04/11/2024   Procedure: LUMBAR FOUR-FIVE, LUMBAR FIVE-SACRAL ONE ANTERIOR LUMBAR FUSION;  Surgeon: Debby Dorn MATSU, MD;  Location: [facility] OR;  Service: Neurosurgery;  Laterality: Right;  ALIF L45, L5S1   APPLICATION OF CELL SAVER N/A 04/13/2024   Procedure: APPLICATION OF CELL SAVER;  Surgeon: Debby Dorn MATSU, MD;  Location: [facility] OR;  Service: Neurosurgery;  Laterality: N/A;   APPLICATION OF ROBOTIC ASSISTANCE FOR SPINAL PROCEDURE N/A 04/13/2024   Procedure: APPLICATION OF ROBOTIC ASSISTANCE FOR SPINAL PROCEDURE;  Surgeon: Debby Dorn MATSU, MD;  Location: [facility] OR;  Service: Neurosurgery;  Laterality: N/A;   COLONOSCOPY WITH PROPOFOL  N/A 05/29/2018   Procedure: COLONOSCOPY WITH PROPOFOL ;  Surgeon: Shaaron Lamar HERO, MD;   Location: [facility];  Service: Endoscopy;  Laterality: N/A;  9:45am   KNEE ARTHROSCOPY WITH MEDIAL MENISECTOMY Left 11/11/2016   Procedure: LEFT KNEE ARTHROSCOPY WITH MEDIAL MENISECTOMY;  Surgeon: Taft FORBES Minerva, MD;  Location: [facility] ORS;  Service: Orthopedics;  Laterality: Left;   Thumb surgery-left  02/25/2020   Patient Active Problem List   Diagnosis Date Noted   Lumbar radiculopathy 04/11/2024   Scoliosis deformity of spine 03/02/2024   Abscess 05/20/2021   Tobacco use 01/22/2021   Gastroesophageal reflux disease without esophagitis 08/17/2017   Chronic seasonal allergic rhinitis due to pollen 03/09/2017   Depression, recurrent 02/11/2017   Generalized OA 10/27/2016   Chronic midline low back pain without sciatica 10/27/2016   Hypertension, essential 10/27/2016   History of seizures 10/27/2016   REFERRING PROVIDER: Camie Pickle  REFERRING DIAG: Other forms of scoliosis, thoracolumbar region.    Rationale for Evaluation and Treatment: Rehabilitation  THERAPY DIAG:  Other low back pain  Muscle weakness (generalized)  Muscle spasm of back  ONSET DATE: Ongoing, surgery (04/13/24).    SUBJECTIVE:  SUBJECTIVE STATEMENT: Pt reports 1-2/10 low back pain.  Pt states that he has to get under his house and fix his water lines after today's treatment session.  PERTINENT HISTORY:  Please see above.    PAIN:  Are you having pain? Yes: NPRS scale: 1-2/10 Pain location: RLE and low back  Pain description: Ache, numbness over right LE.   Aggravating factors: Increased activity. Relieving factors: Rest.    PRECAUTIONS: Other: Please monitor gait.    RED FLAGS: None   WEIGHT BEARING RESTRICTIONS: No  FALLS:  Has patient fallen in last 6 months? No.  He reports no falls but as been close  to falling.    LIVING ENVIRONMENT: Lives with: lives with their spouse Lives in: House/apartment Has following equipment at home: None.  Recommended he use his cane at all times.    PLOF: Independent with basic ADLs  PATIENT GOALS: Reduce pain and numbness, get stronger.    OBJECTIVE:   PATIENT SURVEYS:  ODI:  32/50.  POSTURE: rounded shoulders and forward head  PALPATION: Patient c/o diffuse bilateral lumbar pain.    LOWER EXTREMITY ROM:     WNL.  LOWER EXTREMITY MMT:    Right hip flexion and knee extension is 3-/5, right hamstrings 4-/5, right hip abduction is 4/5, right ankle 4+/5.    FUNCTIONAL TESTS:  5 times sit to stand: 25 seconds Timed up and go (TUG): 15 seconds.    GAIT: The patient's gait is slow and purposeful with a wide BOS and increased right LE toe out.  TREATMENT DATE:   11/13/24:                                   EXERCISE LOG    LB  Exercise Repetitions and Resistance Comments  Nustep Level 3 x 15 minutes (LE's only).   Rockerboard  In parallel bars x 5 minutes   LAQs 3# x 25 reps bil   Seated hip add Ball squeezes x 3 minutes    Seated hip abd Green theraband x 4 minutes   Seated rows Green    3x10   Standing Marching 20 reps bil   Standing Hip Abduction 20 reps bil   Seated Ham Curls Green x 25 reps bil   STS 10 reps     10/18/24:                                     EXERCISE LOG    LB and RT LE weakness  Exercise Repetitions and Resistance Comments  Nustep Level 3 x 15 minutes    Rockerboard In parallel bars x 5 minutes    Seated hip add Ball squeezes x 5 minutes   Seated hip abd Yellow theraband x 5 minutes   Assisted right knee ext   3x10 for TKE   LAQ 3# 3x10 pause at top Within available ROM  Assisted right hip flexion (seated) 4 minutes   Seated right ham curls green theraband 2x20     10/03/24:  Nustep on level 2 x 15 minutes (LE's only) f/b Rockerboard with UE support x 4 minutes f/b seated hip adductor (ball squeezes x 2  minutes) f/b seated hip abduction with red theraband x 2 minutes.  PATIENT EDUCATION:  Education details: Discussed using cane for safety. He has a cane and walking stick.    Person educated: Patient Education method: Explanation Education comprehension: verbalized understanding  HOME EXERCISE PROGRAM:   ASSESSMENT:  CLINICAL IMPRESSION: Pt arrives for today's treatment session reporting 1-2/10 low back pain today.  Pt able to tolerate increased time with previously standing exercises.  Pt introduced to several new seated exercises with min cues for proper technique.  Pt denied any change in pain at completion of today's treatment session.   OBJECTIVE IMPAIRMENTS: cardiopulmonary status limiting activity, decreased activity tolerance, decreased strength, increased muscle spasms, and pain.   ACTIVITY LIMITATIONS: carrying, lifting, bending, and locomotion level  PARTICIPATION LIMITATIONS: meal prep, cleaning, laundry, driving, shopping, community activity, and yard work  PERSONAL FACTORS: Time since onset of injury/illness/exacerbation are also affecting patient's functional outcome.   REHAB POTENTIAL: Good  CLINICAL DECISION MAKING: Stable/uncomplicated  EVALUATION COMPLEXITY: Low   GOALS:  SHORT TERM GOALS: Target date: 10/16/24  Ind with a HEP. Goal status: MET   LONG TERM GOALS: Target date: 11/14/24  Ind with an advanced HEP.  Goal status: IN PROGRESS  2.  Improve 5 times sit to stand test by 5 seconds.  Goal status: IN PROGRESS  3.  Patient walk 500 feet in clinic with straight cane without LOB.   11/19:  Goal status: IN PROGRESS  4.  Improve right LE strength by 1 muscle grade to allow for greater ease of performing functional tasks.    Goal status: IN PROGRESS  5.  Perform ADL's with pain not > 4/10.  Goal status: IN  PROGRESS  PLAN:  PT FREQUENCY: 2x/week  PT DURATION: 6 weeks  PLANNED INTERVENTIONS: 97110-Therapeutic exercises, 97530- Therapeutic activity, [id number]- Neuromuscular re-education, 97535- Self Care, 02859- Manual therapy, G0283- Electrical stimulation (unattended), 97035- Ultrasound, Patient/Family education, and Moist heat.  PLAN FOR NEXT SESSION: Right LE strengthening, gait activities, Core exercise progression.  Body mechanics training and spinal protection techniques.  Modalities and STW/M as needed.     Bradley Fisher, PTA 11/13/2024, 10:12 AM

## 2024-11-16 ENCOUNTER — Ambulatory Visit: Admitting: *Deleted

## 2024-11-20 ENCOUNTER — Ambulatory Visit: Admitting: *Deleted

## 2024-11-20 DIAGNOSIS — M5459 Other low back pain: Secondary | ICD-10-CM

## 2024-11-20 DIAGNOSIS — M62830 Muscle spasm of back: Secondary | ICD-10-CM

## 2024-11-20 DIAGNOSIS — M6281 Muscle weakness (generalized): Secondary | ICD-10-CM

## 2024-11-20 NOTE — Therapy (Signed)
 OUTPATIENT PHYSICAL THERAPY THORACOLUMBAR TREATMENT   Patient Name: Bradley Fisher MRN: [id number] DOB:[birth year], 57 y.o., male Today's Date: 11/20/2024  END OF SESSION:  PT End of Session - 11/20/24 0935     Visit Number 8    Number of Visits 12    Date for Recertification  11/14/24    PT Start Time 0930    PT Stop Time 1011    PT Time Calculation (min) 41 min           Past Medical History:  Diagnosis Date   Arthritis    Asthma    COPD (chronic obstructive pulmonary disease) (HCC)    Depression    GERD (gastroesophageal reflux disease)    Hyperlipidemia    Hypertension    Seizures (HCC)    unknow etiology; on meds for this; no seizures since on meds 10 years ago   Past Surgical History:  Procedure Laterality Date   ABDOMINAL EXPOSURE N/A 04/11/2024   Procedure: ABDOMINAL EXPOSURE;  Surgeon: Gretta Lonni JINNY, MD;  Location: [facility] OR;  Service: Vascular;  Laterality: N/A;   ANTERIOR LAT LUMBAR FUSION Right 04/11/2024   Procedure: LUMBAR ONE-TWO, LUMBAR THREE-FOUR ANTERIOR LATERAL LUMBAR FUSION;  Surgeon: Debby Dorn MATSU, MD;  Location: [facility] OR;  Service: Neurosurgery;  Laterality: Right;  L12, L34 DLIF RT SIDE LATERAL DECUB   ANTERIOR LUMBAR FUSION Right 04/11/2024   Procedure: LUMBAR FOUR-FIVE, LUMBAR FIVE-SACRAL ONE ANTERIOR LUMBAR FUSION;  Surgeon: Debby Dorn MATSU, MD;  Location: [facility] OR;  Service: Neurosurgery;  Laterality: Right;  ALIF L45, L5S1   APPLICATION OF CELL SAVER N/A 04/13/2024   Procedure: APPLICATION OF CELL SAVER;  Surgeon: Debby Dorn MATSU, MD;  Location: [facility] OR;  Service: Neurosurgery;  Laterality: N/A;   APPLICATION OF ROBOTIC ASSISTANCE FOR SPINAL PROCEDURE N/A 04/13/2024   Procedure: APPLICATION OF ROBOTIC ASSISTANCE FOR SPINAL PROCEDURE;  Surgeon: Debby Dorn MATSU, MD;  Location: [facility] OR;  Service: Neurosurgery;  Laterality: N/A;   COLONOSCOPY WITH PROPOFOL  N/A 05/29/2018   Procedure: COLONOSCOPY WITH PROPOFOL ;  Surgeon: Shaaron Lamar HERO, MD;   Location: [facility];  Service: Endoscopy;  Laterality: N/A;  9:45am   KNEE ARTHROSCOPY WITH MEDIAL MENISECTOMY Left 11/11/2016   Procedure: LEFT KNEE ARTHROSCOPY WITH MEDIAL MENISECTOMY;  Surgeon: Taft FORBES Minerva, MD;  Location: [facility] ORS;  Service: Orthopedics;  Laterality: Left;   Thumb surgery-left  02/25/2020   Patient Active Problem List   Diagnosis Date Noted   Lumbar radiculopathy 04/11/2024   Scoliosis deformity of spine 03/02/2024   Abscess 05/20/2021   Tobacco use 01/22/2021   Gastroesophageal reflux disease without esophagitis 08/17/2017   Chronic seasonal allergic rhinitis due to pollen 03/09/2017   Depression, recurrent 02/11/2017   Generalized OA 10/27/2016   Chronic midline low back pain without sciatica 10/27/2016   Hypertension, essential 10/27/2016   History of seizures 10/27/2016   REFERRING PROVIDER: Camie Pickle  REFERRING DIAG: Other forms of scoliosis, thoracolumbar region.    Rationale for Evaluation and Treatment: Rehabilitation  THERAPY DIAG:  Other low back pain  Muscle weakness (generalized)  Muscle spasm of back  ONSET DATE: Ongoing, surgery (04/13/24).    SUBJECTIVE:  SUBJECTIVE STATEMENT: Pt reports 8/10 all over pain today with emphasis on back and right LE.  PERTINENT HISTORY:  Please see above.    PAIN:  Are you having pain? Yes: NPRS scale: 8/10 Pain location: RLE and low back  Pain description: Ache, numbness over right LE.   Aggravating factors: Increased activity. Relieving factors: Rest.    PRECAUTIONS: Other: Please monitor gait.    RED FLAGS: None   WEIGHT BEARING RESTRICTIONS: No  FALLS:  Has patient fallen in last 6 months? No.  He reports no falls but as been close to falling.    LIVING ENVIRONMENT: Lives with: lives with  their spouse Lives in: House/apartment Has following equipment at home: None.  Recommended he use his cane at all times.    PLOF: Independent with basic ADLs  PATIENT GOALS: Reduce pain and numbness, get stronger.    OBJECTIVE:   PATIENT SURVEYS:  ODI:  32/50.  POSTURE: rounded shoulders and forward head  PALPATION: Patient c/o diffuse bilateral lumbar pain.    LOWER EXTREMITY ROM:     WNL.  LOWER EXTREMITY MMT:    Right hip flexion and knee extension is 3-/5, right hamstrings 4-/5, right hip abduction is 4/5, right ankle 4+/5.    FUNCTIONAL TESTS:  5 times sit to stand: 25 seconds Timed up and go (TUG): 15 seconds.    GAIT: The patient's gait is slow and purposeful with a wide BOS and increased right LE toe out.  TREATMENT DATE:   11/20/24:                                   EXERCISE LOG    LB  Exercise Repetitions and Resistance Comments  Nustep Level 3 x 15 minutes (LE's only).   Rockerboard  In parallel bars x 5 minutes   LAQs 3# x 25 reps bil   Seated hip add Ball squeezes x 3 minutes    Seated hip abd Green theraband x 4 minutes   Seated rows Green    3x10   Standing Marching 25 reps bil   Standing Hip Abduction 25 reps bil   Seated Ham Curls Green x 30 reps bil   STS 10 reps   Goal Assessment See Below     10/18/24:                                     EXERCISE LOG    LB and RT LE weakness  Exercise Repetitions and Resistance Comments  Nustep Level 3 x 15 minutes    Rockerboard In parallel bars x 5 minutes    Seated hip add Ball squeezes x 5 minutes   Seated hip abd Yellow theraband x 5 minutes   Assisted right knee ext   3x10 for TKE   LAQ 3# 3x10 pause at top Within available ROM  Assisted right hip flexion (seated) 4 minutes   Seated right ham curls green theraband 2x20     10/03/24:  Nustep on level 2 x 15 minutes (LE's only) f/b Rockerboard with UE support x 4 minutes f/b seated hip adductor (ball squeezes x 2 minutes) f/b seated hip  abduction with red theraband x 2 minutes.  PATIENT EDUCATION:  Education details: Discussed using cane for safety. He has a cane and walking stick.    Person educated: Patient Education method: Explanation Education comprehension: verbalized understanding  HOME EXERCISE PROGRAM:   ASSESSMENT:  CLINICAL IMPRESSION: Pt arrives for today's treatment session reporting 8/10 low back pain today.  Pt states that he has MD appointment tomorrow.  Pt able to perform 5 STS test in 16.3 seconds, decreasing his time by approximately 9 seconds.  Pt has demonstrated increased muscle strength in all muscle groups. Pt states that he has continued issues with pain in his low back and RLE.  Pt able to tolerate increased reps with various standing and seated exercises today with increased difficulty with RLE.  Pt reports minimal decrease in low back and RLE pain at completion of today's treatment session.   OBJECTIVE IMPAIRMENTS: cardiopulmonary status limiting activity, decreased activity tolerance, decreased strength, increased muscle spasms, and pain.   ACTIVITY LIMITATIONS: carrying, lifting, bending, and locomotion level  PARTICIPATION LIMITATIONS: meal prep, cleaning, laundry, driving, shopping, community activity, and yard work  PERSONAL FACTORS: Time since onset of injury/illness/exacerbation are also affecting patient's functional outcome.   REHAB POTENTIAL: Good  CLINICAL DECISION MAKING: Stable/uncomplicated  EVALUATION COMPLEXITY: Low   GOALS:  SHORT TERM GOALS: Target date: 10/16/24  Ind with a HEP. Goal status: MET   LONG TERM GOALS: Target date: 11/14/24  Ind with an advanced HEP.  Goal status: IN PROGRESS  2.  Improve 5 times sit to stand test by 5 seconds. 12/9: 16.3 seconds  Goal status: MET  3.  Patient walk 500 feet in clinic with straight cane  without LOB.   12/9: >500 ft no AD  Goal status: MET  4.  Improve right LE strength by 1 muscle grade to allow for greater ease of performing functional tasks.   12/9: Knee Extension and Hamstrings 4/5; Hip Flexion 3/5; Hip Abduction 4+/5 Goal status: IN PROGRESS  5.  Perform ADL's with pain not > 4/10.  12/9: Depends on the day, pain can get up to 9/10 Goal status: IN PROGRESS  PLAN:  PT FREQUENCY: 2x/week  PT DURATION: 6 weeks  PLANNED INTERVENTIONS: 97110-Therapeutic exercises, 97530- Therapeutic activity, 97112- Neuromuscular re-education, 97535- Self Care, 02859- Manual therapy, G0283- Electrical stimulation (unattended), 97035- Ultrasound, Patient/Family education, and Moist heat.  PLAN FOR NEXT SESSION: Right LE strengthening, gait activities, Core exercise progression.  Body mechanics training and spinal protection techniques.  Modalities and STW/M as needed.     Delon DELENA Gosling, PTA 11/20/2024, 10:19 AM

## 2024-11-28 ENCOUNTER — Ambulatory Visit: Admitting: Physical Therapy

## 2024-11-29 ENCOUNTER — Ambulatory Visit: Admitting: Physical Therapy

## 2024-11-29 ENCOUNTER — Encounter: Payer: Self-pay | Admitting: Physical Therapy

## 2024-11-29 DIAGNOSIS — M6281 Muscle weakness (generalized): Secondary | ICD-10-CM

## 2024-11-29 DIAGNOSIS — G8929 Other chronic pain: Secondary | ICD-10-CM

## 2024-11-29 DIAGNOSIS — R0600 Dyspnea, unspecified: Secondary | ICD-10-CM

## 2024-11-29 DIAGNOSIS — M5459 Other low back pain: Secondary | ICD-10-CM | POA: Diagnosis not present

## 2024-11-29 DIAGNOSIS — R293 Abnormal posture: Secondary | ICD-10-CM

## 2024-11-29 DIAGNOSIS — M62830 Muscle spasm of back: Secondary | ICD-10-CM

## 2024-11-29 DIAGNOSIS — M1990 Unspecified osteoarthritis, unspecified site: Secondary | ICD-10-CM

## 2024-11-29 NOTE — Therapy (Signed)
 OUTPATIENT PHYSICAL THERAPY THORACOLUMBAR TREATMENT AND DISCHARGE  PHYSICAL THERAPY DISCHARGE SUMMARY  Visits from Start of Care: 9  Current functional level related to goals / functional outcomes: See below   Remaining deficits: See below   Education / Equipment: HEP   Patient agrees to discharge. Patient goals were partially met. Patient is being discharged due to maximized rehab potential.     Patient Name: Bradley Fisher MRN: [id number] DOB:September 24, 1966, 58 y.o., male Today's Date: 11/29/2024  END OF SESSION:  PT End of Session - 11/29/24 0849     Visit Number 9    Number of Visits 12    Date for Recertification  11/14/24    PT Start Time 0845    PT Stop Time 0925    PT Time Calculation (min) 40 min    Activity Tolerance Patient tolerated treatment well            Past Medical History:  Diagnosis Date   Arthritis    Asthma    COPD (chronic obstructive pulmonary disease) (HCC)    Depression    GERD (gastroesophageal reflux disease)    Hyperlipidemia    Hypertension    Seizures (HCC)    unknow etiology; on meds for this; no seizures since on meds 10 years ago   Past Surgical History:  Procedure Laterality Date   ABDOMINAL EXPOSURE N/A 04/11/2024   Procedure: ABDOMINAL EXPOSURE;  Surgeon: Gretta Lonni JINNY, MD;  Location: [facility] OR;  Service: Vascular;  Laterality: N/A;   ANTERIOR LAT LUMBAR FUSION Right 04/11/2024   Procedure: LUMBAR ONE-TWO, LUMBAR THREE-FOUR ANTERIOR LATERAL LUMBAR FUSION;  Surgeon: Debby Dorn MATSU, MD;  Location: [facility] OR;  Service: Neurosurgery;  Laterality: Right;  L12, L34 DLIF RT SIDE LATERAL DECUB   ANTERIOR LUMBAR FUSION Right 04/11/2024   Procedure: LUMBAR FOUR-FIVE, LUMBAR FIVE-SACRAL ONE ANTERIOR LUMBAR FUSION;  Surgeon: Debby Dorn MATSU, MD;  Location: [facility] OR;  Service: Neurosurgery;  Laterality: Right;  ALIF L45, L5S1   APPLICATION OF CELL SAVER N/A 04/13/2024   Procedure: APPLICATION OF CELL SAVER;  Surgeon: Debby Dorn MATSU, MD;  Location: [facility] OR;  Service: Neurosurgery;  Laterality: N/A;   APPLICATION OF ROBOTIC ASSISTANCE FOR SPINAL PROCEDURE N/A 04/13/2024   Procedure: APPLICATION OF ROBOTIC ASSISTANCE FOR SPINAL PROCEDURE;  Surgeon: Debby Dorn MATSU, MD;  Location: [facility] OR;  Service: Neurosurgery;  Laterality: N/A;   COLONOSCOPY WITH PROPOFOL  N/A 05/29/2018   Procedure: COLONOSCOPY WITH PROPOFOL ;  Surgeon: Shaaron Lamar HERO, MD;  Location: [facility];  Service: Endoscopy;  Laterality: N/A;  9:45am   KNEE ARTHROSCOPY WITH MEDIAL MENISECTOMY Left 11/11/2016   Procedure: LEFT KNEE ARTHROSCOPY WITH MEDIAL MENISECTOMY;  Surgeon: Taft FORBES Minerva, MD;  Location: [facility]S;  Service: Orthopedics;  Laterality: Left;   Thumb surgery-left  02/25/2020   Patient Active Problem List   Diagnosis Date Noted   Lumbar radiculopathy 04/11/2024   Scoliosis deformity of spine 03/02/2024   Abscess 05/20/2021   Tobacco use 01/22/2021   Gastroesophageal reflux disease without esophagitis 08/17/2017   Chronic seasonal allergic rhinitis due to pollen 03/09/2017   Depression, recurrent 02/11/2017   Generalized OA 10/27/2016   Chronic midline low back pain without sciatica 10/27/2016   Hypertension, essential 10/27/2016   History of seizures 10/27/2016   REFERRING PROVIDER: Camie Pickle  REFERRING DIAG: Other forms of scoliosis, thoracolumbar region.    Rationale for Evaluation and Treatment: Rehabilitation  THERAPY DIAG:  Other low back pain  Muscle weakness (generalized)  Muscle spasm of  back  Dyspnea, unspecified type  Arthritis  Chronic midline low back pain without sciatica  Abnormal posture  ONSET DATE: Ongoing, surgery (04/13/24).    SUBJECTIVE:                                                                                                                                                                                           SUBJECTIVE STATEMENT: Pt states he's doing pretty good today. Ready to  d/c  PERTINENT HISTORY:  Please see above.    PAIN:  Are you having pain? Yes: NPRS scale: 8/10 Pain location: RLE and low back  Pain description: Ache, numbness over right LE.   Aggravating factors: Increased activity. Relieving factors: Rest.    PRECAUTIONS: Other: Please monitor gait.    RED FLAGS: None   WEIGHT BEARING RESTRICTIONS: No  FALLS:  Has patient fallen in last 6 months? No.  He reports no falls but as been close to falling.    LIVING ENVIRONMENT: Lives with: lives with their spouse Lives in: House/apartment Has following equipment at home: None.  Recommended he use his cane at all times.    PLOF: Independent with basic ADLs  PATIENT GOALS: Reduce pain and numbness, get stronger.    OBJECTIVE:   PATIENT SURVEYS:  ODI:  32/50. (Eval) ODI: 31/50  POSTURE: rounded shoulders and forward head  PALPATION: Patient c/o diffuse bilateral lumbar pain.    LOWER EXTREMITY ROM:     WNL.  LOWER EXTREMITY MMT:    Right hip flexion and knee extension is 3-/5, right hamstrings 4-/5, right hip abduction is 4/5, right ankle 4+/5.    FUNCTIONAL TESTS:  5 times sit to stand: 25 seconds Timed up and go (TUG): 15 seconds.    GAIT: The patient's gait is slow and purposeful with a wide BOS and increased right LE toe out.  TREATMENT DATE:  11/29/24:                                   EXERCISE LOG    LB  Exercise Repetitions and Resistance Comments  Nustep Level 5 x 15 minutes (LE's only).   Palloff press red TB X 2 min   Shoulder ext red TB X 2 min   Standing march red TB X 2 min   Standing hip abd red TB X 2 min   Standing hip ext red TB X 2 min   Self care  HEP updates and modifications. Continuing strengthening at home. Call clinic for issues  11/20/24:                                   EXERCISE LOG    LB  Exercise Repetitions and Resistance Comments  Nustep Level 3 x 15 minutes (LE's only).   Rockerboard  In parallel bars x 5  minutes   LAQs 3# x 25 reps bil   Seated hip add Ball squeezes x 3 minutes    Seated hip abd Green theraband x 4 minutes   Seated rows Green    3x10   Standing Marching 25 reps bil   Standing Hip Abduction 25 reps bil   Seated Ham Curls Green x 30 reps bil   STS 10 reps   Goal Assessment See Below     10/18/24:                                     EXERCISE LOG    LB and RT LE weakness  Exercise Repetitions and Resistance Comments  Nustep Level 3 x 15 minutes    Rockerboard In parallel bars x 5 minutes    Seated hip add Ball squeezes x 5 minutes   Seated hip abd Yellow theraband x 5 minutes   Assisted right knee ext   3x10 for TKE   LAQ 3# 3x10 pause at top Within available ROM  Assisted right hip flexion (seated) 4 minutes   Seated right ham curls green theraband 2x20     10/03/24:  Nustep on level 2 x 15 minutes (LE's only) f/b Rockerboard with UE support x 4 minutes f/b seated hip adductor (ball squeezes x 2 minutes) f/b seated hip abduction with red theraband x 2 minutes.                                                                                                                              PATIENT EDUCATION:  Education details: Discussed using cane for safety. He has a cane and walking stick.    Person educated: Patient Education method: Explanation Education comprehension: verbalized understanding  HOME EXERCISE PROGRAM: Access Code: [email or web address] URL: [email or web address] Date: 11/29/2024 Prepared by: Zawadi Aplin April Earnie Starring  Exercises - Standing Anti-Rotation Press with Anchored Resistance  - 1 x daily - 7 x weekly - 2 sets - 10 reps - 3 sec hold - Shoulder extension with resistance - Neutral  - 1 x daily - 7 x weekly - 2 sets - 10 reps - 3 sec hold - Standing March with Counter Support  - 1 x daily - 7 x weekly - 2 sets - 10 reps - Hip Abduction with Resistance Loop  - 1 x daily - 7 x weekly - 2 sets - 10 reps - Hip Extension with Resistance Loop   - 1 x daily - 7 x  weekly - 2 sets - 10 reps  ASSESSMENT:  CLINICAL IMPRESSION: Pt has only partially met his LTGs. Strength has increased overall but pt continues to report 8/10 pain. ODI has improved by 1 point. Still weak with his hips and core -- discussed with pt that he will need to continue strengthening at home. Provided updated HEP to work on this. Pt is now ready for d/c.   OBJECTIVE IMPAIRMENTS: cardiopulmonary status limiting activity, decreased activity tolerance, decreased strength, increased muscle spasms, and pain.   ACTIVITY LIMITATIONS: carrying, lifting, bending, and locomotion level  PARTICIPATION LIMITATIONS: meal prep, cleaning, laundry, driving, shopping, community activity, and yard work  PERSONAL FACTORS: Time since onset of injury/illness/exacerbation are also affecting patient's functional outcome.   REHAB POTENTIAL: Good  CLINICAL DECISION MAKING: Stable/uncomplicated  EVALUATION COMPLEXITY: Low   GOALS:  SHORT TERM GOALS: Target date: 10/16/24  Ind with a HEP. Goal status: MET   LONG TERM GOALS: Target date: 11/14/24  Ind with an advanced HEP.  Goal status: IN PROGRESS  2.  Improve 5 times sit to stand test by 5 seconds. 12/9: 16.3 seconds  Goal status: MET  3.  Patient walk 500 feet in clinic with straight cane without LOB.   12/9: >500 ft no AD  Goal status: MET  4.  Improve right LE strength by 1 muscle grade to allow for greater ease of performing functional tasks.   12/9: Knee Extension and Hamstrings 4/5; Hip Flexion 3/5; Hip Abduction 4+/5 Goal status: IN PROGRESS  5.  Perform ADL's with pain not > 4/10.  12/9: Depends on the day, pain can get up to 9/10 Goal status: IN PROGRESS  PLAN:  PT FREQUENCY: 2x/week  PT DURATION: 6 weeks  PLANNED INTERVENTIONS: 97110-Therapeutic exercises, 97530- Therapeutic activity, 97112- Neuromuscular re-education, 97535- Self Care, 02859- Manual therapy, G0283- Electrical stimulation  (unattended), 97035- Ultrasound, Patient/Family education, and Moist heat.  PLAN FOR NEXT SESSION: Right LE strengthening, gait activities, Core exercise progression.  Body mechanics training and spinal protection techniques.  Modalities and STW/M as needed.     Karenna Romanoff April Ma L Oliver Heitzenrater, PT 11/29/2024, 9:24 AM
